# Patient Record
Sex: Male | Born: 1940 | Race: White | Hispanic: No | Marital: Married | Smoking: Former smoker
Health system: Southern US, Community
[De-identification: ages and names within clinical notes are randomized; demographics above are authoritative.]

## PROBLEM LIST (undated history)

## (undated) DIAGNOSIS — M1990 Unspecified osteoarthritis, unspecified site: Secondary | ICD-10-CM

## (undated) DIAGNOSIS — E11319 Type 2 diabetes mellitus with unspecified diabetic retinopathy without macular edema: Secondary | ICD-10-CM

## (undated) DIAGNOSIS — R270 Ataxia, unspecified: Secondary | ICD-10-CM

## (undated) DIAGNOSIS — K219 Gastro-esophageal reflux disease without esophagitis: Secondary | ICD-10-CM

## (undated) DIAGNOSIS — M549 Dorsalgia, unspecified: Secondary | ICD-10-CM

## (undated) DIAGNOSIS — R5383 Other fatigue: Secondary | ICD-10-CM

## (undated) DIAGNOSIS — K279 Peptic ulcer, site unspecified, unspecified as acute or chronic, without hemorrhage or perforation: Secondary | ICD-10-CM

## (undated) DIAGNOSIS — R251 Tremor, unspecified: Secondary | ICD-10-CM

## (undated) DIAGNOSIS — Z87442 Personal history of urinary calculi: Secondary | ICD-10-CM

## (undated) DIAGNOSIS — E119 Type 2 diabetes mellitus without complications: Secondary | ICD-10-CM

## (undated) DIAGNOSIS — G4700 Insomnia, unspecified: Secondary | ICD-10-CM

## (undated) DIAGNOSIS — I10 Essential (primary) hypertension: Secondary | ICD-10-CM

## (undated) DIAGNOSIS — E538 Deficiency of other specified B group vitamins: Secondary | ICD-10-CM

## (undated) DIAGNOSIS — F32A Depression, unspecified: Secondary | ICD-10-CM

## (undated) DIAGNOSIS — M869 Osteomyelitis, unspecified: Secondary | ICD-10-CM

## (undated) DIAGNOSIS — H269 Unspecified cataract: Secondary | ICD-10-CM

## (undated) DIAGNOSIS — G629 Polyneuropathy, unspecified: Secondary | ICD-10-CM

## (undated) DIAGNOSIS — E7800 Pure hypercholesterolemia, unspecified: Secondary | ICD-10-CM

## (undated) DIAGNOSIS — R42 Dizziness and giddiness: Secondary | ICD-10-CM

## (undated) DIAGNOSIS — M5416 Radiculopathy, lumbar region: Secondary | ICD-10-CM

## (undated) DIAGNOSIS — F419 Anxiety disorder, unspecified: Secondary | ICD-10-CM

## (undated) DIAGNOSIS — N200 Calculus of kidney: Secondary | ICD-10-CM

## (undated) DIAGNOSIS — H9190 Unspecified hearing loss, unspecified ear: Secondary | ICD-10-CM

## (undated) HISTORY — DX: Dizziness and giddiness: R42

## (undated) HISTORY — DX: Calculus of kidney: N20.0

## (undated) HISTORY — DX: Other fatigue: R53.83

## (undated) HISTORY — DX: Ataxia, unspecified: R27.0

## (undated) HISTORY — DX: Tremor, unspecified: R25.1

## (undated) HISTORY — DX: Insomnia, unspecified: G47.00

## (undated) HISTORY — DX: Osteomyelitis, unspecified: M86.9

## (undated) HISTORY — DX: Unspecified osteoarthritis, unspecified site: M19.90

## (undated) HISTORY — PX: JOINT REPLACEMENT: SHX530

## (undated) HISTORY — DX: Depression, unspecified: F32.A

## (undated) HISTORY — DX: Peptic ulcer, site unspecified, unspecified as acute or chronic, without hemorrhage or perforation: K27.9

## (undated) HISTORY — DX: Deficiency of other specified B group vitamins: E53.8

## (undated) HISTORY — DX: Anxiety disorder, unspecified: F41.9

## (undated) HISTORY — PX: EYE SURGERY: SHX253

## (undated) HISTORY — DX: Essential (primary) hypertension: I10

## (undated) HISTORY — DX: Radiculopathy, lumbar region: M54.16

## (undated) HISTORY — DX: Unspecified hearing loss, unspecified ear: H91.90

## (undated) HISTORY — DX: Polyneuropathy, unspecified: G62.9

## (undated) HISTORY — DX: Pure hypercholesterolemia, unspecified: E78.00

## (undated) HISTORY — PX: CATARACT EXTRACTION: SUR2

## (undated) HISTORY — DX: Type 2 diabetes mellitus with unspecified diabetic retinopathy without macular edema: E11.319

## (undated) HISTORY — DX: Type 2 diabetes mellitus without complications: E11.9

## (undated) HISTORY — DX: Unspecified cataract: H26.9

---

## 2016-06-11 ENCOUNTER — Encounter (INDEPENDENT_AMBULATORY_CARE_PROVIDER_SITE_OTHER): Payer: Self-pay

## 2016-06-11 ENCOUNTER — Encounter (INDEPENDENT_AMBULATORY_CARE_PROVIDER_SITE_OTHER): Payer: Self-pay | Admitting: Internal Medicine

## 2016-06-23 ENCOUNTER — Encounter (INDEPENDENT_AMBULATORY_CARE_PROVIDER_SITE_OTHER): Payer: Self-pay | Admitting: Internal Medicine

## 2016-06-23 ENCOUNTER — Ambulatory Visit (INDEPENDENT_AMBULATORY_CARE_PROVIDER_SITE_OTHER): Payer: Medicare HMO | Admitting: Internal Medicine

## 2016-06-23 VITALS — BP 150/68 | HR 60 | Temp 97.8°F | Ht 67.0 in | Wt 183.2 lb

## 2016-06-23 DIAGNOSIS — R195 Other fecal abnormalities: Secondary | ICD-10-CM

## 2016-06-23 DIAGNOSIS — I10 Essential (primary) hypertension: Secondary | ICD-10-CM

## 2016-06-23 DIAGNOSIS — E7800 Pure hypercholesterolemia, unspecified: Secondary | ICD-10-CM

## 2016-06-23 DIAGNOSIS — E119 Type 2 diabetes mellitus without complications: Secondary | ICD-10-CM

## 2016-06-23 HISTORY — DX: Pure hypercholesterolemia, unspecified: E78.00

## 2016-06-23 HISTORY — DX: Type 2 diabetes mellitus without complications: E11.9

## 2016-06-23 HISTORY — DX: Essential (primary) hypertension: I10

## 2016-06-23 NOTE — Progress Notes (Addendum)
   Subjective:    Patient ID: Thomas Adkins, male    DOB: 13-Sep-1940, 75 y.o.   MRN: [id number]  HPI Referred by Dr. Woody Seller for positive stool card/colonoscopy. Patient denies seeing any blood. No change in his stools. Usually has a BM once a day and sometimes two a day. Appetite is good. No weight loss. No abdominal pain. Takes Aleve occasionally. From records his last colonoscopy was in 2010 by Dr. Lindalou Hose and he reports it was normal.  No family hx of colon cancer. Diabetic x 4-5 yrs.   05/16/2016 H and H 14.3 and 42.3   Review of Systems     Past Medical History:  Diagnosis Date  . Diabetes ([place]) 06/23/2016  . High blood pressure 06/23/2016  . High cholesterol 06/23/2016    Past Surgical History:  Procedure Laterality Date  . CATARACT EXTRACTION     bilateral  . JOINT REPLACEMENT     toe 40 yrs ago    No Known Allergies  No current outpatient prescriptions on file prior to visit.   No current facility-administered medications on file prior to visit.     Objective:   Physical Exam Blood pressure (!) 150/68, pulse 60, temperature 97.8 F (36.6 C), height 5\' 7"  (1.702 m), weight 183 lb 3.2 oz (83.1 kg).  Alert and oriented. Skin warm and dry. Oral mucosa is moist.   . Sclera anicteric, conjunctivae is pink. Thyroid not enlarged. No cervical lymphadenopathy. Lungs clear. Heart regular rate and rhythm.  Abdomen is soft. Bowel sounds are positive. No hepatomegaly. No abdominal masses felt. No tenderness.  No edema to lower extremities.        Assessment & Plan:  Positive stool card.  Patient has declined a colonoscopy.Advised if he reconsiders will schedule the colonoscopy.

## 2016-06-23 NOTE — Patient Instructions (Signed)
Patient declined colonoscopy today.

## 2016-06-25 ENCOUNTER — Encounter (INDEPENDENT_AMBULATORY_CARE_PROVIDER_SITE_OTHER): Payer: Self-pay

## 2017-06-05 ENCOUNTER — Emergency Department (HOSPITAL_COMMUNITY): Payer: Medicare HMO

## 2017-06-05 ENCOUNTER — Encounter (HOSPITAL_COMMUNITY): Payer: Self-pay

## 2017-06-05 ENCOUNTER — Emergency Department (HOSPITAL_COMMUNITY)
Admission: EM | Admit: 2017-06-05 | Discharge: 2017-06-05 | Disposition: A | Discharge status: Home / Self Care | Payer: Medicare HMO | Attending: Emergency Medicine | Admitting: Emergency Medicine

## 2017-06-05 DIAGNOSIS — Z7984 Long term (current) use of oral hypoglycemic drugs: Secondary | ICD-10-CM | POA: Diagnosis not present

## 2017-06-05 DIAGNOSIS — M5030 Other cervical disc degeneration, unspecified cervical region: Secondary | ICD-10-CM | POA: Insufficient documentation

## 2017-06-05 DIAGNOSIS — Z7982 Long term (current) use of aspirin: Secondary | ICD-10-CM | POA: Insufficient documentation

## 2017-06-05 DIAGNOSIS — Z79899 Other long term (current) drug therapy: Secondary | ICD-10-CM | POA: Insufficient documentation

## 2017-06-05 DIAGNOSIS — I10 Essential (primary) hypertension: Secondary | ICD-10-CM | POA: Insufficient documentation

## 2017-06-05 DIAGNOSIS — W19XXXA Unspecified fall, initial encounter: Secondary | ICD-10-CM | POA: Diagnosis not present

## 2017-06-05 DIAGNOSIS — M7918 Myalgia, other site: Secondary | ICD-10-CM

## 2017-06-05 DIAGNOSIS — E119 Type 2 diabetes mellitus without complications: Secondary | ICD-10-CM | POA: Insufficient documentation

## 2017-06-05 DIAGNOSIS — M542 Cervicalgia: Secondary | ICD-10-CM | POA: Diagnosis present

## 2017-06-05 MED ORDER — HYDROCODONE-ACETAMINOPHEN 5-325 MG PO TABS
1.0000 | ORAL_TABLET | Freq: Once | ORAL | Status: AC
  Administered 2017-06-05: 1 via ORAL
  Filled 2017-06-05: qty 1

## 2017-06-05 MED ORDER — HYDROCODONE-ACETAMINOPHEN 5-325 MG PO TABS: ORAL_TABLET | ORAL | 0 refills | Status: DC

## 2017-06-05 MED ORDER — METHOCARBAMOL 500 MG PO TABS
500.0000 mg | ORAL_TABLET | Freq: Once | ORAL | Status: AC
  Administered 2017-06-05: 500 mg via ORAL
  Filled 2017-06-05: qty 1

## 2017-06-05 MED ORDER — METHOCARBAMOL 500 MG PO TABS: 500.0000 mg | ORAL_TABLET | Freq: Two times a day (BID) | ORAL | 0 refills | Status: DC | PRN

## 2017-06-05 NOTE — Discharge Instructions (Signed)
Take the prescriptions as directed.  Apply moist heat or ice to the area(s) of discomfort, for 15 minutes at a time, several times per day for the next few days.  Do not fall asleep on a heating or ice pack.  Call your regular medical doctor on Monday to schedule a follow up appointment in the next 3 days.  Return to the Emergency Department immediately if worsening.

## 2017-06-05 NOTE — ED Provider Notes (Signed)
[facility] EMERGENCY DEPARTMENT Provider Note   CSN: [id number] Arrival date & time: 06/05/17  2353     History   Chief Complaint Chief Complaint  Patient presents with  . Neck Injury    HPI Thomas Adkins is a 76 y.o. male.  HPI Pt was seen at 0710. Per pt, c/o gradual onset and persistence of constant neck "pain" for the past 2 days.  Pain worsens with palpation of the area and body position changes. Pt states he fell backwards onto his buttocks 5 to 6 days ago. Denies hitting head, no LOC, no AMS, no LBP.  Denies incont/retention of bowel or bladder, no saddle anesthesia, no focal motor weakness, no tingling/numbness in extremities, no fevers, no direct injury, no abd pain, no CP/SOB.      Past Medical History:  Diagnosis Date  . Diabetes ([place]) 06/23/2016  . High blood pressure 06/23/2016  . High cholesterol 06/23/2016    Patient Active Problem List   Diagnosis Date Noted  . High blood pressure 06/23/2016  . High cholesterol 06/23/2016  . Diabetes ([place]) 06/23/2016    Past Surgical History:  Procedure Laterality Date  . CATARACT EXTRACTION     bilateral  . JOINT REPLACEMENT     toe 40 yrs ago       Home Medications    Prior to Admission medications   Medication Sig Start Date End Date Taking? Authorizing Provider  aspirin 81 MG chewable tablet Chew by mouth daily.    [provider]  gabapentin (NEURONTIN) 300 MG capsule Take 300 mg by mouth at bedtime.    [provider]  glipiZIDE (GLUCOTROL) 10 MG tablet Take 10 mg by mouth 2 (two) times daily before a meal.    [provider]  losartan (COZAAR) 100 MG tablet Take 100 mg by mouth daily.    [provider]  metFORMIN (GLUCOPHAGE) 500 MG tablet Take by mouth 2 (two) times daily with a meal. Two in am and one in pm    [provider]  metoprolol (LOPRESSOR) 50 MG tablet Take 50 mg by mouth 2 (two) times daily. 1 in am and 1/2 in pm    [provider]    pioglitazone (ACTOS) 15 MG tablet Take 15 mg by mouth daily.    [provider]  simvastatin (ZOCOR) 20 MG tablet Take 20 mg by mouth daily.    [provider]    Family History History reviewed. No pertinent family history.  Social History Social History  Substance Use Topics  . Smoking status: Never Smoker  . Smokeless tobacco: Never Used  . Alcohol use No     Allergies   Patient has no known allergies.   Review of Systems Review of Systems ROS: Statement: All systems negative except as marked or noted in the HPI; Constitutional: Negative for fever and chills. ; ; Eyes: Negative for eye pain, redness and discharge. ; ; ENMT: Negative for ear pain, hoarseness, nasal congestion, sinus pressure and sore throat. ; ; Cardiovascular: Negative for chest pain, palpitations, diaphoresis, dyspnea and peripheral edema. ; ; Respiratory: Negative for cough, wheezing and stridor. ; ; Gastrointestinal: Negative for nausea, vomiting, diarrhea, abdominal pain, blood in stool, hematemesis, jaundice and rectal bleeding. . ; ; Genitourinary: Negative for dysuria, flank pain and hematuria. ; ; Musculoskeletal: Negative for back pain. +neck pain. Negative for swelling and direct trauma.; ; Skin: Negative for pruritus, rash, abrasions, blisters, bruising and skin lesion.; ; Neuro: Negative for headache,  lightheadedness and neck stiffness. Negative for weakness, altered level of consciousness, altered mental status, extremity weakness, paresthesias, involuntary movement, seizure and syncope.       Physical Exam Updated Vital Signs BP (!) 163/76 (BP Location: Right Arm)   Pulse 92   Temp 99.2 F (37.3 C) (Oral)   Resp 18   Wt 78.9 kg (174 lb)   SpO2 97%   BMI 27.25 kg/m   Physical Exam 0715: Physical examination:  Nursing notes reviewed; Vital signs and O2 SAT reviewed;  Constitutional: Well developed, Well nourished, Well hydrated, In no acute distress; Head:  Normocephalic,  atraumatic; Eyes: EOMI, PERRL, No scleral icterus; ENMT: Mouth and pharynx normal, Mucous membranes moist; Neck: Supple, Full range of motion, No lymphadenopathy; Cardiovascular: Regular rate and rhythm, No gallop; Respiratory: Breath sounds clear & equal bilaterally, No wheezes.  Speaking full sentences with ease, Normal respiratory effort/excursion; Chest: Nontender, Movement normal; Abdomen: Soft, Nontender, Nondistended, Normal bowel sounds; Genitourinary: No CVA tenderness; Spine:  No midline CS, TS, LS tenderness. +TTP bilat hypertonic trapezius muscles. No rash.;; Extremities: Pulses normal, No tenderness, No edema, No calf edema or asymmetry.; Neuro: AA&Ox3, Major CN grossly intact.  Speech clear. No gross focal motor or sensory deficits in extremities.; Skin: Color normal, Warm, Dry.   ED Treatments / Results  Labs (all labs ordered are listed, but only abnormal results are displayed)   EKG  EKG Interpretation None       Radiology   Procedures Procedures (including critical care time)  Medications Ordered in ED Medications  HYDROcodone-acetaminophen (NORCO/VICODIN) 5-325 MG per tablet 1 tablet (not administered)  methocarbamol (ROBAXIN) tablet 500 mg (not administered)     Initial Impression / Assessment and Plan / ED Course  I have reviewed the triage vital signs and the nursing notes.  Pertinent labs & imaging results that were available during my care of the patient were reviewed by me and considered in my medical decision making (see chart for details).  MDM Reviewed: previous chart, nursing note and vitals Interpretation: CT scan   Ct Cervical Spine Wo Contrast Result Date: 06/05/2017 CLINICAL DATA:  Neck pain. EXAM: CT CERVICAL SPINE WITHOUT CONTRAST TECHNIQUE: Multidetector CT imaging of the cervical spine was performed without intravenous contrast. Multiplanar CT image reconstructions were also generated. COMPARISON:  None. FINDINGS: Alignment: Normal. Skull  base and vertebrae: No acute fracture. No primary bone lesion or focal pathologic process. Soft tissues and spinal canal: No prevertebral fluid or swelling. No visible canal hematoma. Disc levels: Partial osseous fusion of the C3-C4 vertebral bodies. Solid osseous fusion of the left C3-C4 facets. Moderate disc height loss and left facet arthropathy at C4-C5 and C5-C6. Scattered uncovertebral hypertrophy. Mild neuroforaminal stenosis on the left at C3-C4 and C4-C5, and bilaterally at C5-C6. Upper chest: Atherosclerotic vascular calcifications. Otherwise negative. Other: None. IMPRESSION: 1.  No acute osseous abnormality. 2. Multilevel degenerative changes of the cervical spine as described above. Electronically Signed   By: Titus Dubin M.D.   On: 06/05/2017 08:06    0815:  Improved after meds and wants to go home now. Currently sitting up on stretcher eating fast food breakfast, NAD, resps easy. CT reassuring. Tx symptomatically at this time. Dx and testing d/w pt and family.  Questions answered.  Verb understanding, agreeable to d/c home with outpt f/u.    Final Clinical Impressions(s) / ED Diagnoses   Final diagnoses:  None    New Prescriptions New Prescriptions   No medications on file  Francine Graven, DO 06/09/17 1336

## 2017-06-05 NOTE — ED Triage Notes (Signed)
Pt fell Sunday and about two days later started having some neck pain/shoulder.

## 2017-06-05 NOTE — ED Notes (Signed)
ED Provider at bedside. 

## 2017-08-18 ENCOUNTER — Ambulatory Visit: Payer: Medicare HMO | Admitting: Urology

## 2017-08-18 DIAGNOSIS — N200 Calculus of kidney: Secondary | ICD-10-CM | POA: Diagnosis not present

## 2017-09-01 ENCOUNTER — Other Ambulatory Visit (HOSPITAL_COMMUNITY): Payer: Self-pay | Admitting: Interventional Radiology

## 2017-09-01 DIAGNOSIS — M4646 Discitis, unspecified, lumbar region: Secondary | ICD-10-CM

## 2017-09-01 DIAGNOSIS — M869 Osteomyelitis, unspecified: Secondary | ICD-10-CM

## 2017-09-01 NOTE — Progress Notes (Signed)
Subjective:    Patient ID: Thomas Adkins, male    DOB: 04/13/1941, 77 y.o.   MRN: [id number]  Chief Complaint  Patient presents with  . Osteomyelitis   . Discitis     HPI:  Thomas Adkins is a 77 y.o. 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  Thomas Adkins sustained a fall at home approximately 2 months ago and has continued to have pain located in his lumbar spine and right leg. The pain has been refractory to conservative treatment including toradol and prednisone injection, tramadol, gabapentin, oral prednisone and anti-inflammatories. MRI of the lumbar spine showed findings consistent with discitis and osteomyelitis at L3-4 with destruction of the interior endplate of L3 and of the superior endplate of L4. There was no epidural abscess or paraspinal abscess. There was also broad based disc protrusion at L3-4 creating severe right foraminal stenosis and left far lateral neural impingement. The case was discussed with Dr. Carloyn Adkins who recommended no antibiotics until the area can be cultured.   Continues to have pain that radiates down his right leg. Has had some weight loss with decreased appetite. Denies fevers, chills, or night sweats. Modifying factors include Tramadol which helps to decrease the pain. Pain is increased with movement and has trouble walking for long periods of time. Describes walking to the bathroom is as far as he can walk without stopping. Course of the symptoms have stayed about the same since initial onset. Pain is described as sharp at times. Does have some radiculopathy.    No Known Allergies    Outpatient Medications Prior to Visit  Medication Sig Dispense Refill  . aspirin 81 MG chewable tablet Chew by mouth daily.    Marland Kitchen gabapentin (NEURONTIN) 300 MG capsule Take 300 mg by mouth at bedtime.    Marland Kitchen glipiZIDE (GLUCOTROL) 10 MG tablet Take 10 mg by mouth 2 (two) times daily before a  meal.    . metFORMIN (GLUCOPHAGE) 500 MG tablet Take by mouth 2 (two) times daily with a meal. Two in am and one in pm    . metoprolol (LOPRESSOR) 50 MG tablet Take 50 mg by mouth 2 (two) times daily. 1 in am and 1/2 in pm    . pioglitazone (ACTOS) 15 MG tablet Take 15 mg by mouth daily.    . simvastatin (ZOCOR) 20 MG tablet Take 20 mg by mouth daily.    Marland Kitchen HYDROcodone-acetaminophen (NORCO/VICODIN) 5-325 MG tablet 1 tab Adkins q12 hours prn pain (Patient not taking: Reported on 09/02/2017) 10 tablet 0  . losartan (COZAAR) 100 MG tablet Take 100 mg by mouth daily.    . methocarbamol (ROBAXIN) 500 MG tablet Take 1 tablet (500 mg total) by mouth 2 (two) times daily as needed for muscle spasms. 10 tablet 0   No facility-administered medications prior to visit.      Past Medical History:  Diagnosis Date  . Diabetes ([place]) 06/23/2016  . High blood pressure 06/23/2016  . High cholesterol 06/23/2016      Past Surgical History:  Procedure Laterality Date  . CATARACT EXTRACTION     bilateral  . JOINT REPLACEMENT     toe 40 yrs ago      History reviewed. No pertinent family history.    Social History   Socioeconomic History  . Marital status: Married    Spouse name: Not on file  . Number of children: Not on file  . Years of education:  Not on file  . Highest education level: Not on file  Social Needs  . Financial resource strain: Not on file  . Food insecurity - worry: Not on file  . Food insecurity - inability: Not on file  . Transportation needs - medical: Not on file  . Transportation needs - non-medical: Not on file  Occupational History  . Not on file  Tobacco Use  . Smoking status: Never Smoker  . Smokeless tobacco: Never Used  Substance and Sexual Activity  . Alcohol use: No  . Drug use: No  . Sexual activity: Not on file  Other Topics Concern  . Not on file  Social History Narrative  . Not on file      Review of Systems  Constitutional: Negative for chills,  diaphoresis, fatigue and fever.  Respiratory: Negative for cough, chest tightness and shortness of breath.   Cardiovascular: Negative for chest pain, palpitations and leg swelling.  Gastrointestinal: Negative for abdominal pain.  Musculoskeletal: Positive for back pain.  Neurological: Positive for numbness. Negative for tremors and weakness.       Objective:    BP (!) 146/89   Pulse (!) 121   Temp 98.3 F (36.8 C) (Oral)   Ht _0  (1.676 m)   Wt 166 lb (75.3 kg)   BMI 26.79 kg/m  Nursing note and vital signs reviewed.  Physical Exam  Constitutional: He is oriented to person, place, and time. He appears well-developed and well-nourished. No distress.  Pleasant; seated in the chair in good spirits.   Cardiovascular: Normal rate, regular rhythm, normal heart sounds and intact distal pulses.  Pulmonary/Chest: Effort normal and breath sounds normal. No respiratory distress. He has no wheezes. He has no rales. He exhibits no tenderness.  Musculoskeletal:  Lumbar spine - no obvious deformity, discoloration, or edema noted. There is tenderness of the lumbar spine from L2-L4 primarily on the right side along the spinous and transverse process. There is also tenderness of the paraspinal musculature. ROM decreased secondary to pain. Distal pulses and sensation are intact and appropriate.  Neurological: He is alert and oriented to person, place, and time.  Skin: Skin is warm and dry.  Psychiatric: He has a normal mood and affect. His behavior is normal. Judgment and thought content normal.        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 We will check his blood cultures, CMP, CRP and ESR today. An order has been placed for interventional radiology for aspiration and culture to direct  antibiotic care. No systemic signs of infection. Continue off antibiotics until cultures. Will plan for PICC insertion and 6 weeks of antibiotic therapy.       Relevant Orders   Culture, blood (single)   Culture, blood (single)   C-reactive protein   Sedimentation rate   Comprehensive metabolic panel       I am having Thomas Adkins maintain his aspirin, metoprolol tartrate, pioglitazone, glipiZIDE, gabapentin, losartan, metFORMIN, simvastatin, HYDROcodone-acetaminophen, and methocarbamol.   Follow-up: Pending IR aspiration   Thomas Adkins, [facility]

## 2017-09-02 ENCOUNTER — Ambulatory Visit: Payer: Medicare HMO | Admitting: Family

## 2017-09-02 ENCOUNTER — Encounter: Payer: Self-pay | Admitting: Family

## 2017-09-02 ENCOUNTER — Telehealth (HOSPITAL_COMMUNITY): Payer: Self-pay

## 2017-09-02 VITALS — BP 146/89 | HR 121 | Temp 98.3°F | Ht 66.0 in | Wt 166.0 lb

## 2017-09-02 DIAGNOSIS — M4620 Osteomyelitis of vertebra, site unspecified: Secondary | ICD-10-CM

## 2017-09-02 NOTE — Assessment & Plan Note (Signed)
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 We will check his blood cultures, CMP, CRP and ESR today. An order has been placed for interventional radiology for aspiration and culture to direct antibiotic care. No systemic signs of infection. Continue off antibiotics until cultures. Will plan for PICC insertion and 6 weeks of antibiotic therapy.

## 2017-09-02 NOTE — Telephone Encounter (Signed)
Called to schedule lumbar aspiration, left message for pt to return call. AW

## 2017-09-02 NOTE — Patient Instructions (Signed)
Nice to meet you!  We will check you blood work today and get you scheduled for a culture of your back.  Continue to take your medications as prescribed.   Diskitis Diskitis is irritation and swelling (inflammation) of the disks in the spine. Disks are soft structures that cushion the bones of the spine. This is not a common condition. Diskitis most often affects the disks of the lower back (lumbar disks) or upper back (thoracic disks). What are the causes? A bacterial or viral infection can lead to diskitis. When diskitis develops, it is often accompanied by infection-induced inflammation of the bones (osteomyelitis) surrounding the spine. The condition can also be caused by inflammation from another condition, like an autoimmune disease. If you have an autoimmune disease, your body's defense system (immune system) mistakenly attacks your own healthy cells instead of germs and other things that can make you sick. What increases the risk? People at higher risk of developing diskitis include:  Children.  Older persons.  People with weak immune systems or immune system disorders.  People with diabetes.  People having chemotherapy.  What are the signs or symptoms? Back or stomach pain is the most common symptom of diskitis. Walking, standing, and sitting may be painful. Other symptoms may include:  Trouble standing or rising from a sitting position.  Fever lower than 102F (38.9C).  Back stiffness.  Flu-like symptoms, such as a sore throat and a runny nose.  Irritability.  Feeling pain when the affected area is touched.  How is this diagnosed? Your health care provider can diagnose diskitis based on symptoms and medical history. Your health care provider will also do a physical exam. You may also need to have blood tests or imaging studies, such as:  X-ray of the spine.  MRI of the spine.  A bone scan.  How is this treated? Treatment for diskitis may include:  Bed  rest.  Medicines, such as: ? Antibiotics to treat a possible bacterial infection. ? Anti-inflammatory medicines. ? Steroids if the condition does not improve over time. ? Pain-relieving medicines.  A brace to stop your back from moving.  Follow these instructions at home:  If you were prescribed an antibiotic medicine, finish it all even if you start to feel better.  Take medicines only as directed by your health care provider.  Keep all follow-up visits as directed by your health care provider. This is important. Contact a health care provider if:  You have difficulty walking or standing.  You have persistent back pain.  You are having side effects from medicines. This information is not intended to replace advice given to you by your health care provider. Make sure you discuss any questions you have with your health care provider. Document Released: 06/27/2004 Document Revised: 01/02/2016 Document Reviewed: 11/29/2013 Elsevier Interactive Patient Education  2018 [place] Insertion A peripherally inserted central catheter (PICC) is a long, thin, flexible tube that is inserted into a vein in the upper arm. It is a form of IV access. It is considered to be a "central" line because the tip of the PICC ends in a large vein in your chest. This large vein is called the superior vena cava (SVC). The PICC tip ends in the SVC because there is a lot of blood flow in the SVC. This allows medicines and IV fluids to be quickly distributed throughout the body. The PICC is inserted using a sterile technique by a specially trained health care provider. After the  PICC is inserted, a chest X-ray may be done to ensure that it is in the correct place. Tell a health care provider about:  Any allergies you have.  All medicines you are taking, including vitamins, herbs, eye drops, creams, and over-the-counter medicines.  Any problems you or family members have had with anesthetic  medicines.  Any blood disorders you have.  Any surgeries you have had.  Any medical conditions you have. What are the risks? Generally, this is a safe procedure. However, as with any procedure, complications can occur. Possible complications include:  Infection at the insertion site or in the blood.  Bleeding at the insertion site or internally.  Injury or collapse of the lung.  Movement or malposition of the PICC.  Inflammation of the vein (phlebitis).  Nerve injury or irritation.  Clot formation at the tip of the PICC line.  Blood clot in the lung (pulmonary embolus).  Injury to the large blood vessels or heart (rare).  What happens before the procedure?  Your health care provider may want you to have blood tests. These tests can help tell how well your blood clots.  If you take blood thinners (anticoagulant medicine), ask your health care provider if you should stop taking them. What happens during the procedure?  You will have to lie flat for about 30-45 minutes for this procedure.  Your health care provider will start by identifying a vein into which the PICC line will be placed. This is done using ultrasound or X-ray guidance.  Medicine is used to numb the skin around the insertion site.  The skin where the catheter will be inserted is cleaned and covered with a sterile surgical drape.  A small needle is inserted into the vein, and then a small guidewire is advanced into the superior vena cava.  The catheter is then advanced over the guidewire and moved into position. The guidewire is then removed.  The catheter is flushed and blood is drawn back to confirm it is in the vein.  If this is done without X-ray guidance, an X-ray will be needed to make sure the catheter tip is in the correct position.  Once the placement is confirmed, the PICC is secured to the skin with a device and covered with a sterile dressing. What happens after the procedure?  You should  avoid any strenuous activity for the next 2 days or as directed by your health care provider.  You will be allowed to go back to your regular activities after the procedure.  You will be instructed on the care of your PICC line. This information is not intended to replace advice given to you by your health care provider. Make sure you discuss any questions you have with your health care provider. Document Released: 05/17/2013 Document Revised: 01/02/2016 Document Reviewed: 05/19/2013 Elsevier Interactive Patient Education  2017 [employer].

## 2017-09-03 ENCOUNTER — Encounter: Payer: Self-pay | Admitting: Infectious Disease

## 2017-09-03 LAB — COMPREHENSIVE METABOLIC PANEL
AG Ratio: 1.3 (calc) (ref 1.0–2.5)
ALBUMIN MSPROF: 3.7 g/dL (ref 3.6–5.1)
ALT: 17 U/L (ref 9–46)
AST: 14 U/L (ref 10–35)
Alkaline phosphatase (APISO): 141 U/L — ABNORMAL HIGH (ref 40–115)
BILIRUBIN TOTAL: 0.7 mg/dL (ref 0.2–1.2)
BUN/Creatinine Ratio: 26 (calc) — ABNORMAL HIGH (ref 6–22)
BUN: 34 mg/dL — AB (ref 7–25)
CO2: 23 mmol/L (ref 20–32)
CREATININE: 1.3 mg/dL — AB (ref 0.70–1.18)
Calcium: 9.8 mg/dL (ref 8.6–10.3)
Chloride: 96 mmol/L — ABNORMAL LOW (ref 98–110)
Globulin: 2.8 g/dL (calc) (ref 1.9–3.7)
Glucose, Bld: 613 mg/dL (ref 65–99)
POTASSIUM: 5.2 mmol/L (ref 3.5–5.3)
SODIUM: 130 mmol/L — AB (ref 135–146)
TOTAL PROTEIN: 6.5 g/dL (ref 6.1–8.1)

## 2017-09-03 LAB — C-REACTIVE PROTEIN: CRP: 3.9 mg/L (ref <8.0)

## 2017-09-03 LAB — SEDIMENTATION RATE: SED RATE: 11 mm/h (ref 0–20)

## 2017-09-03 NOTE — Progress Notes (Signed)
I received a call at 1:30 in the morning with the guards this patient's blood sugar that was well over 600. I called the patient at home and informed him of his blood sugar which she said was the highest it ever been. He agreed to go see his primary care physician versus an urgent care physician in the morning again Lab was drawn well over 13 hours prior to the phone call made to me

## 2017-09-08 ENCOUNTER — Telehealth: Payer: Self-pay | Admitting: *Deleted

## 2017-09-08 ENCOUNTER — Other Ambulatory Visit: Payer: Self-pay | Admitting: Radiology

## 2017-09-08 LAB — CULTURE, BLOOD (SINGLE)
MICRO NUMBER: 90102218
MICRO NUMBER:: 90102220

## 2017-09-08 NOTE — Telephone Encounter (Signed)
Relayed message to patient, confirmed that he needs to keep his upcoming appointment at IR tomorrow morning, 6:30 arrival.  Landis Gandy, RN

## 2017-09-08 NOTE — Telephone Encounter (Signed)
-----   Message from [place], [place] sent at 09/08/2017 11:41 AM EST ----- Please inform patient that his blood cultures were were negative indicating no bacterial infection in his blood and his markers of inflammation also looked okay. Please work to continue controlling blood sugars with his PCP. Follow up and additional treatment pending the results of his appointment with IR on 1/31.

## 2017-09-09 ENCOUNTER — Ambulatory Visit (HOSPITAL_COMMUNITY)
Admission: RE | Admit: 2017-09-09 | Discharge: 2017-09-09 | Disposition: A | Discharge status: Home / Self Care | Payer: Medicare HMO | Source: Ambulatory Visit | Attending: Interventional Radiology | Admitting: Interventional Radiology

## 2017-09-09 ENCOUNTER — Encounter (HOSPITAL_COMMUNITY): Payer: Self-pay

## 2017-09-09 DIAGNOSIS — Z7982 Long term (current) use of aspirin: Secondary | ICD-10-CM | POA: Insufficient documentation

## 2017-09-09 DIAGNOSIS — E7800 Pure hypercholesterolemia, unspecified: Secondary | ICD-10-CM | POA: Insufficient documentation

## 2017-09-09 DIAGNOSIS — I10 Essential (primary) hypertension: Secondary | ICD-10-CM | POA: Diagnosis not present

## 2017-09-09 DIAGNOSIS — M4646 Discitis, unspecified, lumbar region: Secondary | ICD-10-CM

## 2017-09-09 DIAGNOSIS — Z7984 Long term (current) use of oral hypoglycemic drugs: Secondary | ICD-10-CM | POA: Insufficient documentation

## 2017-09-09 DIAGNOSIS — M869 Osteomyelitis, unspecified: Secondary | ICD-10-CM

## 2017-09-09 DIAGNOSIS — E119 Type 2 diabetes mellitus without complications: Secondary | ICD-10-CM | POA: Insufficient documentation

## 2017-09-09 HISTORY — PX: IR LUMBAR DISC ASPIRATION W/IMG GUIDE: IMG5306

## 2017-09-09 LAB — BASIC METABOLIC PANEL
Anion gap: 13 (ref 5–15)
BUN: 25 mg/dL — ABNORMAL HIGH (ref 6–20)
CALCIUM: 9.7 mg/dL (ref 8.9–10.3)
CO2: 19 mmol/L — ABNORMAL LOW (ref 22–32)
CREATININE: 1.04 mg/dL (ref 0.61–1.24)
Chloride: 104 mmol/L (ref 101–111)
GFR calc Af Amer: >60 mL/min (ref >60)
GFR calc non Af Amer: >60 mL/min (ref >60)
GLUCOSE: 93 mg/dL (ref 65–99)
Potassium: 5.1 mmol/L (ref 3.5–5.1)
Sodium: 136 mmol/L (ref 135–145)

## 2017-09-09 LAB — CBC
HCT: 39.8 % (ref 39.0–52.0)
Hemoglobin: 13.8 g/dL (ref 13.0–17.0)
MCH: 31.9 pg (ref 26.0–34.0)
MCHC: 34.7 g/dL (ref 30.0–36.0)
MCV: 91.9 fL (ref 78.0–100.0)
PLATELETS: 179 10*3/uL (ref 150–400)
RBC: 4.33 MIL/uL (ref 4.22–5.81)
RDW: 14 % (ref 11.5–15.5)
WBC: 10 10*3/uL (ref 4.0–10.5)

## 2017-09-09 LAB — PROTIME-INR
INR: 0.96
PROTHROMBIN TIME: 12.6 s (ref 11.4–15.2)

## 2017-09-09 LAB — APTT: aPTT: 28 seconds (ref 24–36)

## 2017-09-09 LAB — GLUCOSE, CAPILLARY: Glucose-Capillary: 86 mg/dL (ref 65–99)

## 2017-09-09 MED ORDER — SODIUM CHLORIDE 0.9 % IV SOLN: INTRAVENOUS | Status: DC

## 2017-09-09 MED ORDER — FENTANYL CITRATE (PF) 100 MCG/2ML IJ SOLN
INTRAMUSCULAR | Status: AC | PRN
  Administered 2017-09-09 (×3): 25 ug via INTRAVENOUS

## 2017-09-09 MED ORDER — SODIUM CHLORIDE 0.9 % IV SOLN
INTRAVENOUS | Status: DC
  Administered 2017-09-09: 11:00:00 via INTRAVENOUS

## 2017-09-09 MED ORDER — FENTANYL CITRATE (PF) 100 MCG/2ML IJ SOLN
INTRAMUSCULAR | Status: AC
  Filled 2017-09-09: qty 4

## 2017-09-09 MED ORDER — MIDAZOLAM HCL 2 MG/2ML IJ SOLN
INTRAMUSCULAR | Status: AC
  Filled 2017-09-09: qty 4

## 2017-09-09 MED ORDER — HYDROMORPHONE HCL 1 MG/ML IJ SOLN
INTRAMUSCULAR | Status: AC
  Filled 2017-09-09: qty 1

## 2017-09-09 MED ORDER — MIDAZOLAM HCL 2 MG/2ML IJ SOLN
INTRAMUSCULAR | Status: AC | PRN
  Administered 2017-09-09 (×2): 0.5 mg via INTRAVENOUS
  Administered 2017-09-09: 1 mg via INTRAVENOUS

## 2017-09-09 MED ORDER — SODIUM CHLORIDE 0.9 % IV SOLN
INTRAVENOUS | Status: AC | PRN
  Administered 2017-09-09: 10 mL/h via INTRAVENOUS

## 2017-09-09 MED ORDER — BUPIVACAINE HCL (PF) 0.5 % IJ SOLN
INTRAMUSCULAR | Status: AC
  Filled 2017-09-09: qty 30

## 2017-09-09 NOTE — Progress Notes (Signed)
09/09/17:  Orders for labs wanted were not in the patient orders.  I tried to contact Dr. Tommy Medal to confirm labs.  I was unable to contact him so I paged Dr. Johnnye Sima.  He requested that a routine culture, gram stain, AFB and Fungal culture be done on the specimen obtained during a L3-4 disc aspiration.

## 2017-09-09 NOTE — Discharge Instructions (Addendum)
°Needle Biopsy of the Bone, Care After °Refer to this sheet in the next few weeks. These instructions provide you with information about caring for yourself after your procedure. Your health care provider may also give you more specific instructions. Your treatment has been planned according to current medical practices, but problems sometimes occur. Call your health care provider if you have any problems or questions after your procedure. °What can I expect after the procedure? °After your procedure, it is common to have soreness or tenderness at the puncture site. °Follow these instructions at home: °· Take over-the-counter and prescription medicines only as told by your health care provider. °· Bathe and shower as told by your health care provider. °· Follow instructions from your health care provider about: °? How to take care of your puncture site. °? When and how you should change your bandage (dressing). °? When you should remove your dressing. °· Check your puncture site every day for signs of infection. Watch for: °? Redness, swelling, or worsening pain. °? Fluid, blood, or pus. °· Return to your normal activities as told by your health care provider. °· Keep all follow-up visits as told by your health care provider. This is important. °Contact a health care provider if: °· You have redness, swelling, or worsening pain at the site of your puncture. °· You have fluid, blood, or pus coming from your puncture site. °· You have a fever. °· You have persistent nausea or vomiting. °Get help right away if: °· You develop a rash. °· You have difficulty breathing. °This information is not intended to replace advice given to you by your health care provider. Make sure you discuss any questions you have with your health care provider. °Document Released: 02/13/2005 Document Revised: 01/02/2016 Document Reviewed: 09/03/2014 °Elsevier Interactive Patient Education © 2018 Elsevier Inc[employer] °Moderate Conscious Sedation,  Adult, Care After °These instructions provide you with information about caring for yourself after your procedure. Your health care provider may also give you more specific instructions. Your treatment has been planned according to current medical practices, but problems sometimes occur. Call your health care provider if you have any problems or questions after your procedure. °What can I expect after the procedure? °After your procedure, it is common: °· To feel sleepy for several hours. °· To feel clumsy and have poor balance for several hours. °· To have poor judgment for several hours. °· To vomit if you eat too soon. ° °Follow these instructions at home: °For at least 24 hours after the procedure: ° °· Do not: °? Participate in activities where you could fall or become injured. °? Drive. °? Use heavy machinery. °? Drink alcohol. °? Take sleeping pills or medicines that cause drowsiness. °? Make important decisions or sign legal documents. °? Take care of children on your own. °· Rest. °Eating and drinking °· Follow the diet recommended by your health care provider. °· If you vomit: °? Drink water, juice, or soup when you can drink without vomiting. °? Make sure you have little or no nausea before eating solid foods. °General instructions °· Have a responsible adult stay with you until you are awake and alert. °· Take over-the-counter and prescription medicines only as told by your health care provider. °· If you smoke, do not smoke without supervision. °· Keep all follow-up visits as told by your health care provider. This is important. °Contact a health care provider if: °· You keep feeling nauseous or you keep vomiting. °· You feel light-headed. °·   You develop a rash. °· You have a fever. °Get help right away if: °· You have trouble breathing. °This information is not intended to replace advice given to you by your health care provider. Make sure you discuss any questions you have with your health care  provider. °Document Released: 05/17/2013 Document Revised: 12/30/2015 Document Reviewed: 11/16/2015 °Elsevier Interactive Patient Education © 2018 Elsevier Inc. ° °

## 2017-09-09 NOTE — Procedures (Signed)
S/P L3-L4 disc aspiration under fluoroscopic guidance . Approx 10 cc of thick bloody aspirate obtained.

## 2017-09-09 NOTE — H&P (Signed)
Chief Complaint: Patient was seen in consultation today for lumbar disc aspiration  Referring Physician(s): Dr. Tommy Medal  Supervising Physician: Luanne Bras  Patient Status: [facility] - Out-pt  History of Present Illness: Thomas Adkins is a 77 y.o. male with past medical history of HTN and DM who sustained a fall 2-3 months ago.  Since fall he has had worsening back pain.  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 He has been evaluated by Dr. Tommy Medal who has requested aspiration and culture.   Patient presents to radiology department in his usual state of health for procedure today.  He has no complaints.  He has been NPO.  He does not take blood thinners.   Past Medical History:  Diagnosis Date  . Diabetes ([place]) 06/23/2016  . High blood pressure 06/23/2016  . High cholesterol 06/23/2016    Past Surgical History:  Procedure Laterality Date  . CATARACT EXTRACTION     bilateral  . JOINT REPLACEMENT     toe 40 yrs ago    Allergies: Patient has no known allergies.  Medications: Prior to Admission medications   Medication Sig Start Date End Date Taking? Authorizing Provider  aspirin 81 MG tablet Take 81 mg by mouth daily.    Yes [provider]  diclofenac sodium (VOLTAREN) 1 % GEL Apply 1 application topically 3 (three) times daily as needed (pain).   Yes [provider]  gabapentin (NEURONTIN) 300 MG capsule Take 300 mg by mouth at bedtime.   Yes [provider]  glipiZIDE (GLUCOTROL) 10 MG tablet Take 10 mg by mouth 2 (two) times daily before a meal.   Yes [provider]  metFORMIN (GLUCOPHAGE-XR) 500 MG 24 hr tablet Take 500-1,000 mg by mouth See admin instructions. Pt takes med every other day. 1000 mg in the morning, and 500 mg in the evening 07/27/17  Yes [provider]  metoprolol (LOPRESSOR) 50 MG tablet Take 50 mg by mouth 2 (two) times daily.    Yes [provider]  naproxen sodium  (ALEVE) 220 MG tablet Take 440 mg by mouth 2 (two) times daily as needed.   Yes [provider]  oxyCODONE (OXY IR/ROXICODONE) 5 MG immediate release tablet Take 5 mg by mouth 3 (three) times daily as needed for severe pain.   Yes [provider]  pioglitazone (ACTOS) 15 MG tablet Take 15 mg by mouth daily.   Yes [provider]  simvastatin (ZOCOR) 20 MG tablet Take 20 mg by mouth daily.   Yes [provider]     History reviewed. No pertinent family history.  Social History   Socioeconomic History  . Marital status: Married    Spouse name: None  . Number of children: None  . Years of education: None  . Highest education level: None  Social Needs  . Financial resource strain: None  . Food insecurity - worry: None  . Food insecurity - inability: None  . Transportation needs - medical: None  . Transportation needs - non-medical: None  Occupational History  . None  Tobacco Use  . Smoking status: Never Smoker  . Smokeless tobacco: Never Used  Substance and Sexual Activity  . Alcohol use: No  . Drug use: No  . Sexual activity: None  Other Topics Concern  . None  Social History Narrative  . None    Review of Systems  Constitutional: Negative for fatigue and fever.  Respiratory: Negative for cough and shortness of breath.  Cardiovascular: Negative for chest pain.  Gastrointestinal: Negative for abdominal pain.  Musculoskeletal: Positive for back pain.  Psychiatric/Behavioral: Negative for behavioral problems and confusion.    Vital Signs: BP (!) 141/77   Pulse (!) 58   Temp 98.3 F (36.8 C) (Oral)   Resp 16   Ht 5\' 4"  (1.626 m)   Wt 162 lb (73.5 kg)   SpO2 98%   BMI 27.81 kg/m   Physical Exam  Constitutional: He is oriented to person, place, and time. He appears well-developed.  Cardiovascular: Normal rate, regular rhythm and normal heart sounds.  Pulmonary/Chest: Effort normal and breath sounds normal. No respiratory  distress.  Abdominal: Soft.  Neurological: He is alert and oriented to person, place, and time.  Skin: Skin is warm and dry.  Psychiatric: He has a normal mood and affect. His behavior is normal. Judgment and thought content normal.  Nursing note and vitals reviewed.   Imaging: No results found.  Labs:  CBC: Recent Labs    09/09/17 0640  WBC 10.0  HGB 13.8  HCT 39.8  PLT 179    COAGS: Recent Labs    09/09/17 0640  INR 0.96  APTT 28    BMP: Recent Labs    09/02/17 1004 09/09/17 0640  NA 130* 136  K 5.2 5.1  CL 96* 104  CO2 23 19*  GLUCOSE 613* 93  BUN 34* 25*  CALCIUM 9.8 9.7  CREATININE 1.30* 1.04  GFRNONAA  --  >60  GFRAA  --  >60    LIVER FUNCTION TESTS: Recent Labs    09/02/17 1004  BILITOT 0.7  AST 14  ALT 17  PROT 6.5    TUMOR MARKERS: No results for input(s): AFPTM, CEA, CA199, CHROMGRNA in the last 8760 hours.  Assessment and Plan: Patient with past medical history of DM, HTN presents with complaint of back pain with osteomyelitis of L3-L4 by MRI.  IR consulted for aspiration at the request of Dr. Tommy Medal. Patient may ultimately require PICC placement as well after results of culture are known.  Case reviewed by Dr. Estanislado Pandy who approves patient for procedure.  Patient presents today in their usual state of health.  He has been NPO and is not currently on blood thinners.  Risks and benefits were discussed with the patient including, but not limited to bleeding, infection, spinal cord damage, paralysis. Patient did take his diabetes medication this AM.  Will monitor glucose and s/s of hypoglycemia.  Will support as needed.   Thank you for this interesting consult.  I greatly enjoyed meeting Thomas Adkins and look forward to participating in their care.  A copy of this report was sent to the requesting provider on this date.  Electronically Signed: Docia Barrier, PA 09/09/2017, 8:01 AM   I spent a total of  30 Minutes   in  face to face in clinical consultation, greater than 50% of which was counseling/coordinating care for osteomyelitis.

## 2017-09-10 ENCOUNTER — Encounter (HOSPITAL_COMMUNITY): Payer: Self-pay | Admitting: Interventional Radiology

## 2017-09-10 LAB — ACID FAST SMEAR (AFB): ACID FAST SMEAR - AFSCU2: NEGATIVE

## 2017-09-10 LAB — ACID FAST SMEAR (AFB, MYCOBACTERIA)

## 2017-09-14 LAB — AEROBIC/ANAEROBIC CULTURE (SURGICAL/DEEP WOUND)

## 2017-09-14 LAB — AEROBIC/ANAEROBIC CULTURE W GRAM STAIN (SURGICAL/DEEP WOUND): Culture: NO GROWTH

## 2017-09-22 ENCOUNTER — Telehealth: Payer: Self-pay | Admitting: Family

## 2017-09-22 NOTE — Telephone Encounter (Signed)
Spoke with patient on the phone regarding his negative culture results. Will set up PICC placement and antibiotics for vertebral osteomyelitis with the plan for 6 weeks of therapy.

## 2017-09-28 ENCOUNTER — Other Ambulatory Visit: Payer: Self-pay | Admitting: Pharmacist

## 2017-09-28 ENCOUNTER — Other Ambulatory Visit: Payer: Self-pay | Admitting: Family

## 2017-09-28 ENCOUNTER — Telehealth: Payer: Self-pay | Admitting: Behavioral Health

## 2017-09-28 ENCOUNTER — Other Ambulatory Visit: Payer: Self-pay | Admitting: Behavioral Health

## 2017-09-28 DIAGNOSIS — M4620 Osteomyelitis of vertebra, site unspecified: Secondary | ICD-10-CM

## 2017-09-28 NOTE — Telephone Encounter (Addendum)
Writer called patient to let him know he is scheduled for PICC placement 09/30/2017.  Patient states she spoke with his Doctor, Dr Carloyn Manner and Dr. Woody Seller and they will be treating him with oral Levofloxacin 500 mg orally.  [name] made aware and states this is ok.   Writer called IR to cancel PICC line placement appointment.   Writer left a voicmail with Caryl Pina in [employer].   [name] RN

## 2017-09-28 NOTE — Telephone Encounter (Addendum)
Writer called Thomas Adkins to inform him of  his appointment for PICC line placement.  Writer left a voicemail with the call back number and the appointment date, time and location.  PICC placement appointment scheduled for Thursday September 30, 2017 at 8:30am at [facility].  Writer will try to call patient back at a later time. [name] RN

## 2017-09-28 NOTE — Telephone Encounter (Addendum)
Writer called Thomas Adkins in [facility] to schedule PICC line placement for Thomas Adkins.  IR was that also informed patient will receive first dose of antibiotics in short stay after PICC placed.   (Orders faxed to short stay)  PICC placement scheduled for 09/30/2017 at 9:00am and patient to arrive at 8:30 am.   [name] RN

## 2017-09-29 NOTE — Telephone Encounter (Signed)
Noted, thank you. We will continue to remain available as needed.

## 2017-09-30 ENCOUNTER — Ambulatory Visit (HOSPITAL_COMMUNITY): Payer: Medicare HMO

## 2017-10-11 ENCOUNTER — Telehealth: Payer: Self-pay | Admitting: Family

## 2017-10-11 ENCOUNTER — Telehealth: Payer: Self-pay | Admitting: Behavioral Health

## 2017-10-11 LAB — FUNGUS CULTURE WITH STAIN

## 2017-10-11 LAB — FUNGUS CULTURE RESULT

## 2017-10-11 LAB — FUNGAL ORGANISM REFLEX

## 2017-10-11 NOTE — Telephone Encounter (Signed)
Called Thomas Adkins to let him know that we are in the process of scheduling his PICC line placement.  RN informed him that he will be contacted with the date and time once I hear back from [facility].  Patient was ok with this. [name] RN

## 2017-10-11 NOTE — Telephone Encounter (Signed)
[facility] to update them that Mr. Larkey has an order for PICC line placement but does have an appointment scheduled yet.  Spoke with Debbie.  RN will update them once appointment is scheduled.  Writer will be faxing over orders.  Debbie verbalized understanding. [name] RN

## 2017-10-11 NOTE — Telephone Encounter (Signed)
Initiated the process for PICC line placement per [name] FNP.  Called [facility] and left a voicemail for Anderson Malta to schedule an appointment.  Faxed orders to [facility].  Waiting for IR to call back to schedule an appointment.  Will Update [facility].

## 2017-10-11 NOTE — Telephone Encounter (Signed)
Spoke with patient regarding treatment and it was advised for him to go ahead and set up the PICC treatment. He would like to proceed at this time.

## 2017-10-12 ENCOUNTER — Other Ambulatory Visit: Payer: Self-pay | Admitting: Student

## 2017-10-12 NOTE — Telephone Encounter (Signed)
If he would like to discuss other options a follow up office visit would be appropriate.

## 2017-10-12 NOTE — Telephone Encounter (Addendum)
Jennifer in IR contacted to schedule PICC line insertion for Thomas Adkins.  Appointment scheduled for 3/6/ 2019 at 11:30 AM.    Thomas Adkins from [facility] called back this AM to let me know that she spoke to Thomas Adkins and told him that his IV therapy would be 50 dollars per Day and she Adkins patient was unsure if he would be able to pay for this.  Writer contacted Thomas Adkins to let him  know about patient possibly not being able to afford this medication and Thomas Adkins unfortunately these are the cheaper medications options and if patient wants to continue with oral IV therapy at this time he can. Also if he needs the IV antibiotics in the future then that is an option.  Writer contacted Thomas Adkins to let him know that these are the cheaper of the medications per Thomas Adkins and that he can continue on oral antibiotics for now and if he needs the IV antibiotics in the future then that is an option.  Patient verbalized understanding.  Writer gave the patient the name of the medications and he states he has been in contact with Medicare via Phone.  Patient states he is unable to afford 50 dollars per day for the IV antibiotics.  Writer explained to patient that she will contact Thomas Adkins to see if a follow up appointment is needed.   Thomas Adkins

## 2017-10-12 NOTE — Telephone Encounter (Signed)
Writer called Mr. Pfost to schedule a follow up appointment with [name] FNP to discuss further options.  Appointment is scheduled for Monday 10/25/2017 at 9:45 AM.  Patient will continue to take his oral antibiotics in the mean time.  [name] FNP notified.    Writer also called Debbie at [employer] [facility] and let her know PIC line is on hold at the moment.  Debbie verbalized understanding.    Writer called Anderson Malta in IR to let her know PICC line placement for patient is on hold at the moment due to cost out of pocket for the patient.  Anderson Malta verbalized understanding. [name] RN

## 2017-10-13 ENCOUNTER — Ambulatory Visit (HOSPITAL_COMMUNITY): Admission: RE | Admit: 2017-10-13 | Payer: Medicare HMO | Source: Ambulatory Visit

## 2017-10-22 LAB — ACID FAST CULTURE WITH REFLEXED SENSITIVITIES: ACID FAST CULTURE - AFSCU3: NEGATIVE

## 2017-10-25 ENCOUNTER — Ambulatory Visit: Payer: Medicare HMO | Admitting: Family

## 2017-10-25 ENCOUNTER — Encounter: Payer: Self-pay | Admitting: Family

## 2017-10-25 VITALS — BP 154/78 | HR 65 | Temp 97.9°F | Wt 181.0 lb

## 2017-10-25 DIAGNOSIS — M4620 Osteomyelitis of vertebra, site unspecified: Secondary | ICD-10-CM | POA: Diagnosis not present

## 2017-10-25 MED ORDER — LEVOFLOXACIN 750 MG PO TABS: 750.0000 mg | ORAL_TABLET | Freq: Every day | ORAL | 0 refills | Status: DC

## 2017-10-25 NOTE — Progress Notes (Signed)
Subjective:    Patient ID: Thomas Adkins, male    DOB: [birth year], 77 y.o.   MRN: [id number]  Chief Complaint  Patient presents with  . Follow-up  . Osteomyelitis      HPI:  Thomas Adkins is a 77 y.o. 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   Thomas Adkins is following up today after initial evaluation of his lumbar vertebral osteomyelitis was found to be culture negative. The original plan was to insert a PICC line with goal of 6 weeks of ceftriaxone and vancomycin. [name] then informed this provider that he was going to treat his infection orally with levofloxacin under the direction of neurosurgery. Approximately 2 weeks ago he attempted to have the PICC line placed as he changed his mind and wanted to pursue IV therapy. Medications and PICC were ordered, however he was not able to afford the cost of treatment despite insurance. He is here today to discuss additional treatment options.  He continues to experience low back pain and currently taking levofloxacin daily. Indicates that he does have some improvement in his symptoms but does have some radiculopathy in his right lower extremity. Feels week at times. Continues to take his medication on a daily basis with no missed dosages and no adverse side effects.    No Known Allergies    Outpatient Medications Prior to Visit  Medication Sig Dispense Refill  . aspirin 81 MG tablet Take 81 mg by mouth daily.     . diclofenac sodium (VOLTAREN) 1 % GEL Apply 1 application topically 3 (three) times daily as needed (pain).    Marland Kitchen gabapentin (NEURONTIN) 300 MG capsule Take 300 mg by mouth at bedtime.    Marland Kitchen glipiZIDE (GLUCOTROL) 10 MG tablet Take 10 mg by mouth 2 (two) times daily before a meal.    . metFORMIN (GLUCOPHAGE-XR) 500 MG 24 hr tablet Take 500-1,000 mg by mouth See admin instructions. Pt takes med every other day. 1000 mg in the morning, and 500 mg in the evening    . metoprolol (LOPRESSOR) 50 MG  tablet Take 50 mg by mouth 2 (two) times daily.     . naproxen sodium (ALEVE) 220 MG tablet Take 440 mg by mouth 2 (two) times daily as needed.    Marland Kitchen oxyCODONE (OXY IR/ROXICODONE) 5 MG immediate release tablet Take 5 mg by mouth 3 (three) times daily as needed for severe pain.    . pioglitazone (ACTOS) 15 MG tablet Take 15 mg by mouth daily.    . simvastatin (ZOCOR) 20 MG tablet Take 20 mg by mouth daily.    Marland Kitchen levofloxacin (LEVAQUIN) 500 MG tablet Take 500 mg by mouth daily.  0   No facility-administered medications prior to visit.      Past Medical History:  Diagnosis Date  . Diabetes ([place]) 06/23/2016  . High blood pressure 06/23/2016  . High cholesterol 06/23/2016     Past Surgical History:  Procedure Laterality Date  . CATARACT EXTRACTION     bilateral  . IR LUMBAR DISC ASPIRATION W/IMG GUIDE  09/09/2017  . JOINT REPLACEMENT     toe 40 yrs ago       Review of Systems  Constitutional: Negative for chills, diaphoresis and fever.  Respiratory: Negative for chest tightness, shortness of breath and wheezing.   Cardiovascular: Negative for chest pain.  Musculoskeletal: Positive for back pain.  Neurological: Positive for weakness. Negative for numbness and headaches.      Objective:  BP (!) 154/78 (BP Location: Right Arm, Patient Position: Sitting, Cuff Size: Large)   Pulse 65   Temp 97.9 F (36.6 C) (Oral)   Wt 181 lb (82.1 kg)   BMI 31.07 kg/m  Nursing note and vital signs reviewed.  Physical Exam  Constitutional: He is oriented to person, place, and time. He appears well-developed and well-nourished. No distress.  Cardiovascular: Normal rate, regular rhythm, normal heart sounds and intact distal pulses.  Pulmonary/Chest: Effort normal and breath sounds normal.  Musculoskeletal:  Low back - No obvious deformity, discoloration or edema. There is tenderness of the lumbar spine and paraspinal musculature of the lumbar spine. Distal pulses and sensation are intact and  appropriate.   Neurological: He is alert and oriented to person, place, and time.  Skin: Skin is warm and dry.  Psychiatric: He has a normal mood and affect. His behavior is normal. Judgment and thought content normal.       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 Will check inflammatory markers and kidney function today. Plan to increase levofloxacin to 750 mg for an additional 3 weeks. If infection persists will likely require IV antibiotic therapy. We can arrange for him to receive the infusions at a center due to the cost of the medications. These are the least expensive options that we have available to treat the infection. He is in agreement with the plan.       Relevant Orders   C-reactive protein   Sedimentation rate   Basic Metabolic Panel (BMET)       I have discontinued Luciano Rocque's levofloxacin. I am also having him start on levofloxacin. Additionally, I am having him maintain his aspirin, metoprolol tartrate, pioglitazone, glipiZIDE, gabapentin, simvastatin, oxyCODONE, naproxen sodium, diclofenac sodium, and metFORMIN.   Meds ordered this encounter  Medications  . levofloxacin (LEVAQUIN) 750 MG tablet    Sig: Take 1 tablet (750 mg total) by mouth daily.    Dispense:  21 tablet    Refill:  0    Order Specific Question:   Supervising Provider    Answer:   Carlyle Basques [[id number]]     Follow-up: Return in about 3 weeks (around 11/15/2017), or if symptoms worsen or fail to improve.   [name], MSN, [facility]

## 2017-10-25 NOTE — Assessment & Plan Note (Signed)
Vertebral osteomyelitis appears to be slightly improved but continued localized symptoms with no systemic symptoms. Will check inflammatory markers and kidney function today. Plan to increase levofloxacin to 750 mg for an additional 3 weeks. If infection persists will likely require IV antibiotic therapy. We can arrange for him to receive the infusions at a center due to the cost of the medications. These are the least expensive options that we have available to treat the infection. He is in agreement with the plan.

## 2017-10-25 NOTE — Patient Instructions (Addendum)
Good to see you.  We are going to check your blood work today.  We have increased your medication to levofloxacin 750 mg daily.  We will plan to follow up in 3 weeks.  Continue to monitor your blood sugars - may need to reduce diabetes medications if your blood sugars are running lower to to low (<70).

## 2017-10-26 ENCOUNTER — Telehealth: Payer: Self-pay | Admitting: Behavioral Health

## 2017-10-26 LAB — BASIC METABOLIC PANEL
BUN: 24 mg/dL (ref 7–25)
CO2: 25 mmol/L (ref 20–32)
CREATININE: 1.02 mg/dL (ref 0.70–1.18)
Calcium: 9.3 mg/dL (ref 8.6–10.3)
Chloride: 107 mmol/L (ref 98–110)
Glucose, Bld: 176 mg/dL — ABNORMAL HIGH (ref 65–99)
POTASSIUM: 4.6 mmol/L (ref 3.5–5.3)
SODIUM: 139 mmol/L (ref 135–146)

## 2017-10-26 LAB — C-REACTIVE PROTEIN: CRP: 1.6 mg/L (ref <8.0)

## 2017-10-26 LAB — SEDIMENTATION RATE: Sed Rate: 25 mm/h — ABNORMAL HIGH (ref 0–20)

## 2017-10-26 NOTE — Telephone Encounter (Signed)
Called patient per [name] NP and let him know that his inflammatory markers looked okay and he can continue his current dosage of Levafloxacin which is 750mg  daily.  Also he can keep his one month follow up appointment 11/22/2017 with Marya Amsler.  Patient had no additional questions or concerns.   [name] RN

## 2017-10-26 NOTE — Telephone Encounter (Signed)
-----   Message from [place], [place] sent at 10/26/2017 10:45 AM EDT ----- Please inform patient that his inflammatory markers look okay, and therefore I would like for him to continue to take the increased dose of levofloxacin for 2 weeks as we discussed. Should already have follow up scheduled.

## 2017-11-15 ENCOUNTER — Other Ambulatory Visit: Payer: Self-pay

## 2017-11-15 DIAGNOSIS — M4620 Osteomyelitis of vertebra, site unspecified: Secondary | ICD-10-CM

## 2017-11-15 MED ORDER — LEVOFLOXACIN 750 MG PO TABS: 750.0000 mg | ORAL_TABLET | Freq: Every day | ORAL | 0 refills | Status: DC

## 2017-11-15 NOTE — Progress Notes (Deleted)
Subjective:    Patient ID: Thomas Adkins, male    DOB: 1940/08/24, 77 y.o.   MRN: [id number]  No chief complaint on file.    HPI:  Thomas Adkins is a 77 y.o. 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 Taking medication as prescribed and denies adverse side effects or myalgias. He has now completed 6 weeks of therapy and continues to experience the associated symptom of pain located in his lumbar spine.   No Known Allergies    Outpatient Medications Prior to Visit  Medication Sig Dispense Refill  . aspirin 81 MG tablet Take 81 mg by mouth daily.     . diclofenac sodium (VOLTAREN) 1 % GEL Apply 1 application topically 3 (three) times daily as needed (pain).    Marland Kitchen gabapentin (NEURONTIN) 300 MG capsule Take 300 mg by mouth at bedtime.    Marland Kitchen glipiZIDE (GLUCOTROL) 10 MG tablet Take 10 mg by mouth 2 (two) times daily before a meal.    . levofloxacin (LEVAQUIN) 750 MG tablet Take 1 tablet (750 mg total) by mouth daily. 21 tablet 0  . metFORMIN (GLUCOPHAGE-XR) 500 MG 24 hr tablet Take 500-1,000 mg by mouth See admin instructions. Pt takes med every other day. 1000 mg in the morning, and 500 mg in the evening    . metoprolol (LOPRESSOR) 50 MG tablet Take 50 mg by mouth 2 (two) times daily.     . naproxen sodium (ALEVE) 220 MG tablet Take 440 mg by mouth 2 (two) times daily as needed.    Marland Kitchen oxyCODONE (OXY IR/ROXICODONE) 5 MG immediate release tablet Take 5 mg by mouth 3 (three) times daily as needed for severe pain.    . pioglitazone (ACTOS) 15 MG tablet Take 15 mg by mouth daily.    . simvastatin (ZOCOR) 20 MG tablet Take 20 mg by mouth daily.     No facility-administered medications prior to visit.      Past Medical History:  Diagnosis Date  . Diabetes ([place]) 06/23/2016  . High blood  pressure 06/23/2016  . High cholesterol 06/23/2016     Past Surgical History:  Procedure Laterality Date  . CATARACT EXTRACTION     bilateral  . IR LUMBAR DISC ASPIRATION W/IMG GUIDE  09/09/2017  . JOINT REPLACEMENT     toe 40 yrs ago     Review of Systems  Constitutional: Negative for chills, fatigue, fever and unexpected weight change.  Respiratory: Negative for chest tightness and shortness of breath.   Cardiovascular: Negative for chest pain, palpitations and leg swelling.  Musculoskeletal: Positive for back pain. Negative for myalgias.  Neurological: Negative for weakness and numbness.      Objective:    There were no vitals taken for this visit. Nursing note and vital signs reviewed.  Physical Exam  Constitutional: He is oriented to person, place, and time. He appears well-developed and well-nourished. No distress.  Cardiovascular: Normal rate, regular rhythm, normal heart sounds and intact distal pulses.  Pulmonary/Chest: Effort normal and breath sounds normal.  Neurological: He is alert and oriented to person, place, and time.  Skin: Skin is warm and dry.  Psychiatric: He has a normal mood and affect. His behavior is normal. Judgment and thought content normal.       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   No orders of the defined types were placed in this encounter.    Follow-up: No follow-ups on file.   [name], MSN, [facility]

## 2017-11-17 ENCOUNTER — Other Ambulatory Visit: Payer: Self-pay | Admitting: Urology

## 2017-11-17 ENCOUNTER — Ambulatory Visit: Payer: Medicare HMO | Admitting: Urology

## 2017-11-17 ENCOUNTER — Ambulatory Visit (HOSPITAL_COMMUNITY)
Admission: RE | Admit: 2017-11-17 | Discharge: 2017-11-17 | Disposition: A | Discharge status: Home / Self Care | Payer: Medicare HMO | Source: Ambulatory Visit | Attending: Urology | Admitting: Urology

## 2017-11-17 DIAGNOSIS — N200 Calculus of kidney: Secondary | ICD-10-CM | POA: Diagnosis not present

## 2017-11-17 DIAGNOSIS — R3915 Urgency of urination: Secondary | ICD-10-CM

## 2017-11-18 ENCOUNTER — Other Ambulatory Visit: Payer: Self-pay | Admitting: Urology

## 2017-11-19 ENCOUNTER — Other Ambulatory Visit: Payer: Self-pay | Admitting: Family

## 2017-11-19 DIAGNOSIS — M4620 Osteomyelitis of vertebra, site unspecified: Secondary | ICD-10-CM

## 2017-11-19 MED ORDER — LEVOFLOXACIN 750 MG PO TABS: 750.0000 mg | ORAL_TABLET | Freq: Every day | ORAL | 0 refills | Status: DC

## 2017-11-22 ENCOUNTER — Ambulatory Visit: Payer: Medicare HMO | Admitting: Family

## 2017-11-24 ENCOUNTER — Encounter (HOSPITAL_COMMUNITY): Payer: Self-pay | Admitting: General Practice

## 2017-11-29 ENCOUNTER — Encounter (HOSPITAL_COMMUNITY): Payer: Self-pay | Admitting: General Practice

## 2017-11-29 ENCOUNTER — Ambulatory Visit (HOSPITAL_COMMUNITY): Payer: Medicare HMO

## 2017-11-29 ENCOUNTER — Encounter (HOSPITAL_COMMUNITY)
Admission: RE | Disposition: A | Discharge status: Home / Self Care | Payer: Self-pay | Source: Ambulatory Visit | Attending: Urology

## 2017-11-29 ENCOUNTER — Ambulatory Visit (HOSPITAL_COMMUNITY)
Admission: RE | Admit: 2017-11-29 | Discharge: 2017-11-29 | Disposition: A | Discharge status: Home / Self Care | Payer: Medicare HMO | Source: Ambulatory Visit | Attending: Urology | Admitting: Urology

## 2017-11-29 DIAGNOSIS — K219 Gastro-esophageal reflux disease without esophagitis: Secondary | ICD-10-CM | POA: Insufficient documentation

## 2017-11-29 DIAGNOSIS — N200 Calculus of kidney: Secondary | ICD-10-CM | POA: Insufficient documentation

## 2017-11-29 DIAGNOSIS — E7800 Pure hypercholesterolemia, unspecified: Secondary | ICD-10-CM | POA: Insufficient documentation

## 2017-11-29 DIAGNOSIS — I10 Essential (primary) hypertension: Secondary | ICD-10-CM | POA: Diagnosis not present

## 2017-11-29 DIAGNOSIS — Z79899 Other long term (current) drug therapy: Secondary | ICD-10-CM | POA: Insufficient documentation

## 2017-11-29 DIAGNOSIS — Z7984 Long term (current) use of oral hypoglycemic drugs: Secondary | ICD-10-CM | POA: Insufficient documentation

## 2017-11-29 DIAGNOSIS — E119 Type 2 diabetes mellitus without complications: Secondary | ICD-10-CM | POA: Diagnosis not present

## 2017-11-29 DIAGNOSIS — Z7982 Long term (current) use of aspirin: Secondary | ICD-10-CM | POA: Insufficient documentation

## 2017-11-29 DIAGNOSIS — Z87442 Personal history of urinary calculi: Secondary | ICD-10-CM | POA: Insufficient documentation

## 2017-11-29 HISTORY — DX: Personal history of urinary calculi: Z87.442

## 2017-11-29 HISTORY — PX: EXTRACORPOREAL SHOCK WAVE LITHOTRIPSY: SHX1557

## 2017-11-29 HISTORY — DX: Gastro-esophageal reflux disease without esophagitis: K21.9

## 2017-11-29 HISTORY — DX: Dorsalgia, unspecified: M54.9

## 2017-11-29 LAB — GLUCOSE, CAPILLARY: Glucose-Capillary: 140 mg/dL — ABNORMAL HIGH (ref 65–99)

## 2017-11-29 SURGERY — LITHOTRIPSY, ESWL
Anesthesia: LOCAL | Laterality: Left

## 2017-11-29 MED ORDER — SODIUM CHLORIDE 0.9 % IV SOLN
INTRAVENOUS | Status: DC
  Administered 2017-11-29: 08:00:00 via INTRAVENOUS

## 2017-11-29 MED ORDER — DIAZEPAM 5 MG PO TABS
10.0000 mg | ORAL_TABLET | ORAL | Status: AC
  Administered 2017-11-29: 10 mg via ORAL
  Filled 2017-11-29: qty 2

## 2017-11-29 MED ORDER — DIPHENHYDRAMINE HCL 25 MG PO CAPS
25.0000 mg | ORAL_CAPSULE | ORAL | Status: AC
  Administered 2017-11-29: 25 mg via ORAL
  Filled 2017-11-29: qty 1

## 2017-11-29 MED ORDER — CIPROFLOXACIN HCL 500 MG PO TABS
500.0000 mg | ORAL_TABLET | ORAL | Status: AC
  Administered 2017-11-29: 500 mg via ORAL
  Filled 2017-11-29: qty 1

## 2017-11-29 SURGICAL SUPPLY — 2 items
COVER SURGICAL LIGHT HANDLE (MISCELLANEOUS) ×2 IMPLANT
TOWEL OR 17X26 10 PK STRL BLUE (TOWEL DISPOSABLE) ×2 IMPLANT

## 2017-11-29 NOTE — Op Note (Signed)
See [facility] OP note scanned into chart. 

## 2017-11-29 NOTE — Discharge Instructions (Signed)
See [facility] discharge instructions in chart.  

## 2017-11-29 NOTE — H&P (Signed)
H&P  Chief Complaint: Left renal stone  History of Present Illness: 77 year old male presents for ESL of a 7 mm left interpolar stone. This was found on CT recently performed for left flank/back pain. He initially desired surveillance, but with recurrent sx's he has opted for ESL when discussed with Dr Alyson Ingles.  Past Medical History:  Diagnosis Date  . Back ache    back problems  . Diabetes ([place]) 06/23/2016  . GERD (gastroesophageal reflux disease)   . High blood pressure 06/23/2016  . High cholesterol 06/23/2016  . History of kidney stones     Past Surgical History:  Procedure Laterality Date  . CATARACT EXTRACTION     bilateral  . IR LUMBAR DISC ASPIRATION W/IMG GUIDE  09/09/2017  . JOINT REPLACEMENT     toe 40 yrs ago    Home Medications:  Allergies as of 11/29/2017   No Known Allergies     Medication List    Notice   Cannot display discharge medications because the patient has not yet been admitted.     Allergies: No Known Allergies  History reviewed. No pertinent family history.  Social History:  reports that he has never smoked. He has never used smokeless tobacco. He reports that he does not drink alcohol or use drugs.  ROS: A complete review of systems was performed.  All systems are negative except for pertinent findings as noted.  Physical Exam:  Vital signs in last 24 hours:   Constitutional:  Alert and oriented, No acute distress Cardiovascular: Regular rate. Respiratory: Normal respiratory effort GI: Abdomen is soft, nontender, nondistended, no abdominal masses Neurologic: Grossly intact, no focal deficits Psychiatric: Normal mood and affect  Laboratory Data:  No results for input(s): WBC, HGB, HCT, PLT in the last 72 hours.  No results for input(s): NA, K, CL, GLUCOSE, BUN, CALCIUM, CREATININE in the last 72 hours.  Invalid input(s): CO3   No results found for this or any previous visit (from the past 24 hour(s)). No results found for  this or any previous visit (from the past 240 hour(s)).  Renal Function: No results for input(s): CREATININE in the last 168 hours. CrCl cannot be calculated (Patient's most recent lab result is older than the maximum 21 days allowed.).  Radiologic Imaging: No results found.  Impression/Assessment:  7 mm left renal stone.  Plan:  ESL

## 2017-11-30 ENCOUNTER — Encounter (HOSPITAL_COMMUNITY): Payer: Self-pay | Admitting: Urology

## 2017-12-07 ENCOUNTER — Telehealth: Payer: Self-pay | Admitting: Behavioral Health

## 2017-12-07 ENCOUNTER — Other Ambulatory Visit: Payer: Self-pay | Admitting: Family

## 2017-12-07 DIAGNOSIS — M4620 Osteomyelitis of vertebra, site unspecified: Secondary | ICD-10-CM

## 2017-12-07 NOTE — Telephone Encounter (Signed)
Thomas Adkins called stating he has run out of his Levaquin and was wondering is he needed a refill.  He also states he missed his follow up appointment with [name] 11/22/2017.  Patient has upcoming appointment 12/20/2017 with [name] NP.   [name] RN

## 2017-12-07 NOTE — Telephone Encounter (Signed)
Called Thomas Adkins, informed him lab work will need to be checked in order to determine the next step for treatment. He states he will be able to come in tomorrow for lab work.  Informed him no refill on Levaquin at this time. [name] RN

## 2017-12-07 NOTE — Telephone Encounter (Signed)
His last appointment would have been the completion of 6 weeks of therapy. I would like for him to stop by the lab if he can to check his inflammatory markers to determine the next step of treatment.

## 2017-12-08 ENCOUNTER — Other Ambulatory Visit: Payer: Medicare HMO

## 2017-12-08 DIAGNOSIS — M4620 Osteomyelitis of vertebra, site unspecified: Secondary | ICD-10-CM

## 2017-12-08 LAB — SEDIMENTATION RATE: Sed Rate: 9 mm/h (ref 0–20)

## 2017-12-09 LAB — C-REACTIVE PROTEIN: CRP: 1.3 mg/L (ref <8.0)

## 2017-12-10 ENCOUNTER — Telehealth: Payer: Self-pay | Admitting: Behavioral Health

## 2017-12-10 NOTE — Telephone Encounter (Signed)
-----   Message from Golden Circle, [place] sent at 12/10/2017  9:45 AM EDT ----- Please inform Mr. Brummet that his inflammatory markers look good with no inflammation. Therefore I would not recommend continuing medication at this time. He may continue to follow up as scheduled and would recommend further evaluation by Dr. Carloyn Manner if not already completed.

## 2017-12-10 NOTE — Telephone Encounter (Signed)
Left message for patient to call back.  Did not leave any information will update the patient with information per [name] NP when he calls back.   [name] RN

## 2017-12-15 ENCOUNTER — Ambulatory Visit (INDEPENDENT_AMBULATORY_CARE_PROVIDER_SITE_OTHER): Payer: Medicare HMO | Admitting: Urology

## 2017-12-15 ENCOUNTER — Ambulatory Visit (HOSPITAL_COMMUNITY)
Admission: RE | Admit: 2017-12-15 | Discharge: 2017-12-15 | Disposition: A | Discharge status: Home / Self Care | Payer: Medicare HMO | Source: Ambulatory Visit | Attending: Urology | Admitting: Urology

## 2017-12-15 ENCOUNTER — Other Ambulatory Visit: Payer: Self-pay | Admitting: Urology

## 2017-12-15 DIAGNOSIS — N200 Calculus of kidney: Secondary | ICD-10-CM

## 2017-12-16 NOTE — Progress Notes (Signed)
Subjective:    Patient ID: Thomas Adkins, male    DOB: 1940/10/10, 77 y.o.   MRN: [id number]  Chief Complaint  Patient presents with  . Follow-up  . Osteomyelitis      HPI:  Thomas Adkins is a 77 y.o. 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 He completed his medication 4/8 and his repeat inflammatory markers on 12/08/17 showed a CRP of 1.3 and a ESR of 9. Back pain is improved about 90% since last office visit. He has some weakness in his bilateral legs when standing for any period of time and feels like he is going to lose his balance. He continues to walk with a cane. Denies fevers, chills or night sweats.   Allergies  Allergen Reactions  . Hydrocodone Nausea And Vomiting     Outpatient Medications Prior to Visit  Medication Sig Dispense Refill  . aspirin 81 MG tablet Take 81 mg by mouth daily.     . diclofenac sodium (VOLTAREN) 1 % GEL Apply 1 application topically 3 (three) times daily as needed (pain).    Marland Kitchen gabapentin (NEURONTIN) 300 MG capsule Take 300 mg by mouth at bedtime.    Marland Kitchen glipiZIDE (GLUCOTROL) 10 MG tablet Take 10 mg by mouth 2 (two) times daily before a meal.    . losartan (COZAAR) 100 MG tablet Take 100 mg by mouth daily.    . metFORMIN (GLUCOPHAGE-XR) 500 MG 24 hr tablet Take 500-1,000 mg by mouth See admin instructions. Pt takes med every other day. 1000 mg in the morning, and 500 mg in the evening    . metoprolol (LOPRESSOR) 50 MG tablet Take 50 mg by mouth 2 (two) times daily.     . naproxen sodium (ALEVE) 220 MG tablet Take 440 mg by mouth 2 (two) times daily as needed.    Marland Kitchen omeprazole (PRILOSEC) 20 MG capsule Take 20 mg by mouth daily.    . ondansetron (ZOFRAN) 4 MG tablet Take 4 mg by mouth every 8 (eight) hours as needed for nausea or vomiting.    . pioglitazone (ACTOS) 15 MG tablet Take 15  mg by mouth daily.    . simvastatin (ZOCOR) 20 MG tablet Take 20 mg by mouth daily.    . traMADol (ULTRAM) 50 MG tablet Take 50 mg by mouth every 6 (six) hours as needed.    Marland Kitchen levofloxacin (LEVAQUIN) 750 MG tablet Take 1 tablet (750 mg total) by mouth daily. (Patient not taking: Reported on 12/20/2017) 21 tablet 0   No facility-administered medications prior to visit.      Past Medical History:  Diagnosis Date  . Back ache    back problems  . Diabetes ([place]) 06/23/2016  . GERD (gastroesophageal reflux disease)   . High blood pressure 06/23/2016  . High cholesterol 06/23/2016  . History of kidney stones      Past Surgical History:  Procedure Laterality Date  . CATARACT EXTRACTION     bilateral  . EXTRACORPOREAL SHOCK WAVE LITHOTRIPSY Left 11/29/2017   Procedure: LEFT EXTRACORPOREAL SHOCK WAVE LITHOTRIPSY (ESWL);  Surgeon: Franchot Gallo, MD;  Location: [facility]S;  Service: Urology;  Laterality: Left;  . IR LUMBAR [place] W/IMG GUIDE  09/09/2017  . JOINT REPLACEMENT     toe 40 yrs ago       Review of Systems  Constitutional: Negative for chills, fatigue and fever.  Respiratory: Negative for chest tightness and shortness of breath.   Cardiovascular: Negative for chest pain and palpitations.  Musculoskeletal: Positive for back pain.  Neurological: Positive for weakness. Negative for dizziness.      Objective:    BP (!) 190/84 (BP Location: Right Arm, Patient Position: Sitting, Cuff Size: Normal)   Pulse 61   Temp (!) 97.5 F (36.4 C) (Oral)   Resp 18   Ht _0  (1.626 m)   Wt 175 lb (79.4 kg)   SpO2 98%   BMI 30.04 kg/m  Nursing note and vital signs reviewed.  Physical Exam  Constitutional: He is oriented to person, place, and time. He appears well-developed and well-nourished. No distress.  Cardiovascular: Normal rate, regular rhythm, normal heart sounds and intact distal pulses. Exam reveals no gallop and no friction rub.  No murmur  heard. Pulmonary/Chest: Effort normal and breath sounds normal. No stridor. No respiratory distress. He has no wheezes. He has no rales. He exhibits no tenderness.  Musculoskeletal:  Lower extremity sensation is intact and appropriate. Lower extremity muscle testing 4+/5. There additional weakness of hip flexion bilaterally.   Neurological: He is alert and oriented to person, place, and time.  Skin: Skin is warm and dry.  Psychiatric: He has a normal mood and affect. His behavior is normal. Judgment and thought content normal.       Assessment & Plan:   Problem List Items Addressed This Visit      Musculoskeletal and Integument   Vertebral osteomyelitis ([place]) - Primary    Thomas Adkins has experienced a 90% improvement in his low back pain with some continued lower extremity weakness. Most recent inflammatory markers were within normal limits and he has been doing well since stopping the levofloxacin. Will recheck inflammatory markers today. If normal, recommend follow up with PCP or neurosurgery for next intervention likely physical therapy or potential surgical intervention. Continue to monitor off antibiotics. Follow up with ID as needed or if symptoms worsen.       Relevant Orders   C-reactive protein   Sedimentation rate       I am having Thomas Adkins maintain his aspirin, metoprolol tartrate, pioglitazone, glipiZIDE, gabapentin, simvastatin, naproxen sodium, diclofenac sodium, metFORMIN, levofloxacin, traMADol, omeprazole, ondansetron, and losartan.   Follow-up: Return if symptoms worsen or fail to improve.   Please forwards message to PCP.    [name], MSN, [facility]

## 2017-12-20 ENCOUNTER — Ambulatory Visit: Payer: Medicare HMO | Admitting: Family

## 2017-12-20 ENCOUNTER — Encounter: Payer: Self-pay | Admitting: Family

## 2017-12-20 VITALS — BP 190/84 | HR 61 | Temp 97.5°F | Resp 18 | Ht 64.0 in | Wt 175.0 lb

## 2017-12-20 DIAGNOSIS — M4620 Osteomyelitis of vertebra, site unspecified: Secondary | ICD-10-CM

## 2017-12-20 NOTE — Assessment & Plan Note (Signed)
Thomas Adkins has experienced a 90% improvement in his low back pain with some continued lower extremity weakness. Most recent inflammatory markers were within normal limits and he has been doing well since stopping the levofloxacin. Will recheck inflammatory markers today. If normal, recommend follow up with PCP or neurosurgery for next intervention likely physical therapy or potential surgical intervention. Continue to monitor off antibiotics. Follow up with ID as needed or if symptoms worsen.

## 2017-12-20 NOTE — Patient Instructions (Signed)
Nice to see you!  We will check your inflammatory markers and if normal, no further antibiotic treatment is needed.   Would recommend follow up with Dr. Woody Seller or Dr. Carloyn Manner for the next steps which are likely physical therapy.   Follow up with [place] as needed.

## 2017-12-21 LAB — C-REACTIVE PROTEIN: CRP: 3.8 mg/L (ref <8.0)

## 2017-12-21 LAB — SEDIMENTATION RATE: SED RATE: 14 mm/h (ref 0–20)

## 2017-12-27 ENCOUNTER — Telehealth: Payer: Self-pay | Admitting: Behavioral Health

## 2017-12-27 NOTE — Telephone Encounter (Signed)
Called Mr. Haden, informed him per  that his inflammatory makers look ok and he does not need further antibiotics at this time.  Also informed him to follow up only if symptoms worsen or as needed.  [name] RN

## 2017-12-27 NOTE — Telephone Encounter (Signed)
-----   Message from Golden Circle, [place] sent at 12/21/2017  8:22 AM EDT ----- Please inform patient that his inflammatory markers look okay. No indication for further antibiotics at this time and can follow up if symptoms worsen or as needed.

## 2018-01-10 DIAGNOSIS — Z85828 Personal history of other malignant neoplasm of skin: Secondary | ICD-10-CM | POA: Diagnosis not present

## 2018-01-10 DIAGNOSIS — L820 Inflamed seborrheic keratosis: Secondary | ICD-10-CM | POA: Diagnosis not present

## 2018-01-10 DIAGNOSIS — L570 Actinic keratosis: Secondary | ICD-10-CM | POA: Diagnosis not present

## 2018-01-10 DIAGNOSIS — L821 Other seborrheic keratosis: Secondary | ICD-10-CM | POA: Diagnosis not present

## 2018-01-11 DIAGNOSIS — R2689 Other abnormalities of gait and mobility: Secondary | ICD-10-CM | POA: Diagnosis not present

## 2018-01-11 DIAGNOSIS — M4620 Osteomyelitis of vertebra, site unspecified: Secondary | ICD-10-CM | POA: Diagnosis not present

## 2018-01-11 DIAGNOSIS — M6281 Muscle weakness (generalized): Secondary | ICD-10-CM | POA: Diagnosis not present

## 2018-01-13 DIAGNOSIS — M4620 Osteomyelitis of vertebra, site unspecified: Secondary | ICD-10-CM | POA: Diagnosis not present

## 2018-01-13 DIAGNOSIS — R2689 Other abnormalities of gait and mobility: Secondary | ICD-10-CM | POA: Diagnosis not present

## 2018-01-13 DIAGNOSIS — M6281 Muscle weakness (generalized): Secondary | ICD-10-CM | POA: Diagnosis not present

## 2018-01-18 DIAGNOSIS — M4620 Osteomyelitis of vertebra, site unspecified: Secondary | ICD-10-CM | POA: Diagnosis not present

## 2018-01-18 DIAGNOSIS — M6281 Muscle weakness (generalized): Secondary | ICD-10-CM | POA: Diagnosis not present

## 2018-01-18 DIAGNOSIS — R2689 Other abnormalities of gait and mobility: Secondary | ICD-10-CM | POA: Diagnosis not present

## 2018-01-20 DIAGNOSIS — R2689 Other abnormalities of gait and mobility: Secondary | ICD-10-CM | POA: Diagnosis not present

## 2018-01-20 DIAGNOSIS — M4620 Osteomyelitis of vertebra, site unspecified: Secondary | ICD-10-CM | POA: Diagnosis not present

## 2018-01-20 DIAGNOSIS — M6281 Muscle weakness (generalized): Secondary | ICD-10-CM | POA: Diagnosis not present

## 2018-01-25 DIAGNOSIS — R2689 Other abnormalities of gait and mobility: Secondary | ICD-10-CM | POA: Diagnosis not present

## 2018-01-25 DIAGNOSIS — M6281 Muscle weakness (generalized): Secondary | ICD-10-CM | POA: Diagnosis not present

## 2018-01-25 DIAGNOSIS — M4620 Osteomyelitis of vertebra, site unspecified: Secondary | ICD-10-CM | POA: Diagnosis not present

## 2018-01-27 DIAGNOSIS — R2689 Other abnormalities of gait and mobility: Secondary | ICD-10-CM | POA: Diagnosis not present

## 2018-01-27 DIAGNOSIS — M4620 Osteomyelitis of vertebra, site unspecified: Secondary | ICD-10-CM | POA: Diagnosis not present

## 2018-01-27 DIAGNOSIS — M6281 Muscle weakness (generalized): Secondary | ICD-10-CM | POA: Diagnosis not present

## 2018-02-01 DIAGNOSIS — M4620 Osteomyelitis of vertebra, site unspecified: Secondary | ICD-10-CM | POA: Diagnosis not present

## 2018-02-01 DIAGNOSIS — M6281 Muscle weakness (generalized): Secondary | ICD-10-CM | POA: Diagnosis not present

## 2018-02-01 DIAGNOSIS — R2689 Other abnormalities of gait and mobility: Secondary | ICD-10-CM | POA: Diagnosis not present

## 2018-02-03 DIAGNOSIS — R2689 Other abnormalities of gait and mobility: Secondary | ICD-10-CM | POA: Diagnosis not present

## 2018-02-03 DIAGNOSIS — M6281 Muscle weakness (generalized): Secondary | ICD-10-CM | POA: Diagnosis not present

## 2018-02-03 DIAGNOSIS — M4620 Osteomyelitis of vertebra, site unspecified: Secondary | ICD-10-CM | POA: Diagnosis not present

## 2018-02-08 DIAGNOSIS — R2689 Other abnormalities of gait and mobility: Secondary | ICD-10-CM | POA: Diagnosis not present

## 2018-02-08 DIAGNOSIS — M6281 Muscle weakness (generalized): Secondary | ICD-10-CM | POA: Diagnosis not present

## 2018-02-08 DIAGNOSIS — M4620 Osteomyelitis of vertebra, site unspecified: Secondary | ICD-10-CM | POA: Diagnosis not present

## 2018-02-11 DIAGNOSIS — R2689 Other abnormalities of gait and mobility: Secondary | ICD-10-CM | POA: Diagnosis not present

## 2018-02-11 DIAGNOSIS — M4620 Osteomyelitis of vertebra, site unspecified: Secondary | ICD-10-CM | POA: Diagnosis not present

## 2018-02-11 DIAGNOSIS — M6281 Muscle weakness (generalized): Secondary | ICD-10-CM | POA: Diagnosis not present

## 2018-02-15 DIAGNOSIS — M6281 Muscle weakness (generalized): Secondary | ICD-10-CM | POA: Diagnosis not present

## 2018-02-15 DIAGNOSIS — M4620 Osteomyelitis of vertebra, site unspecified: Secondary | ICD-10-CM | POA: Diagnosis not present

## 2018-02-15 DIAGNOSIS — R2689 Other abnormalities of gait and mobility: Secondary | ICD-10-CM | POA: Diagnosis not present

## 2018-02-17 DIAGNOSIS — Z299 Encounter for prophylactic measures, unspecified: Secondary | ICD-10-CM | POA: Diagnosis not present

## 2018-02-17 DIAGNOSIS — I10 Essential (primary) hypertension: Secondary | ICD-10-CM | POA: Diagnosis not present

## 2018-02-17 DIAGNOSIS — R2689 Other abnormalities of gait and mobility: Secondary | ICD-10-CM | POA: Diagnosis not present

## 2018-02-17 DIAGNOSIS — M4620 Osteomyelitis of vertebra, site unspecified: Secondary | ICD-10-CM | POA: Diagnosis not present

## 2018-02-17 DIAGNOSIS — M6281 Muscle weakness (generalized): Secondary | ICD-10-CM | POA: Diagnosis not present

## 2018-02-17 DIAGNOSIS — M79606 Pain in leg, unspecified: Secondary | ICD-10-CM | POA: Diagnosis not present

## 2018-02-17 DIAGNOSIS — E7800 Pure hypercholesterolemia, unspecified: Secondary | ICD-10-CM | POA: Diagnosis not present

## 2018-02-17 DIAGNOSIS — Z6827 Body mass index (BMI) 27.0-27.9, adult: Secondary | ICD-10-CM | POA: Diagnosis not present

## 2018-02-17 DIAGNOSIS — E1165 Type 2 diabetes mellitus with hyperglycemia: Secondary | ICD-10-CM | POA: Diagnosis not present

## 2018-02-21 DIAGNOSIS — M6281 Muscle weakness (generalized): Secondary | ICD-10-CM | POA: Diagnosis not present

## 2018-02-21 DIAGNOSIS — R2689 Other abnormalities of gait and mobility: Secondary | ICD-10-CM | POA: Diagnosis not present

## 2018-02-21 DIAGNOSIS — M4620 Osteomyelitis of vertebra, site unspecified: Secondary | ICD-10-CM | POA: Diagnosis not present

## 2018-02-23 DIAGNOSIS — R2689 Other abnormalities of gait and mobility: Secondary | ICD-10-CM | POA: Diagnosis not present

## 2018-02-23 DIAGNOSIS — M6281 Muscle weakness (generalized): Secondary | ICD-10-CM | POA: Diagnosis not present

## 2018-02-23 DIAGNOSIS — M4620 Osteomyelitis of vertebra, site unspecified: Secondary | ICD-10-CM | POA: Diagnosis not present

## 2018-03-01 DIAGNOSIS — R2689 Other abnormalities of gait and mobility: Secondary | ICD-10-CM | POA: Diagnosis not present

## 2018-03-01 DIAGNOSIS — M4620 Osteomyelitis of vertebra, site unspecified: Secondary | ICD-10-CM | POA: Diagnosis not present

## 2018-03-01 DIAGNOSIS — M6281 Muscle weakness (generalized): Secondary | ICD-10-CM | POA: Diagnosis not present

## 2018-03-09 DIAGNOSIS — R2689 Other abnormalities of gait and mobility: Secondary | ICD-10-CM | POA: Diagnosis not present

## 2018-03-09 DIAGNOSIS — M4620 Osteomyelitis of vertebra, site unspecified: Secondary | ICD-10-CM | POA: Diagnosis not present

## 2018-03-09 DIAGNOSIS — M6281 Muscle weakness (generalized): Secondary | ICD-10-CM | POA: Diagnosis not present

## 2018-03-11 ENCOUNTER — Other Ambulatory Visit: Payer: Self-pay | Admitting: Urology

## 2018-03-11 DIAGNOSIS — M4620 Osteomyelitis of vertebra, site unspecified: Secondary | ICD-10-CM | POA: Diagnosis not present

## 2018-03-11 DIAGNOSIS — N200 Calculus of kidney: Secondary | ICD-10-CM

## 2018-03-11 DIAGNOSIS — M6281 Muscle weakness (generalized): Secondary | ICD-10-CM | POA: Diagnosis not present

## 2018-03-11 DIAGNOSIS — R2689 Other abnormalities of gait and mobility: Secondary | ICD-10-CM | POA: Diagnosis not present

## 2018-03-15 DIAGNOSIS — R2689 Other abnormalities of gait and mobility: Secondary | ICD-10-CM | POA: Diagnosis not present

## 2018-03-15 DIAGNOSIS — M4620 Osteomyelitis of vertebra, site unspecified: Secondary | ICD-10-CM | POA: Diagnosis not present

## 2018-03-15 DIAGNOSIS — M6281 Muscle weakness (generalized): Secondary | ICD-10-CM | POA: Diagnosis not present

## 2018-03-17 DIAGNOSIS — R2689 Other abnormalities of gait and mobility: Secondary | ICD-10-CM | POA: Diagnosis not present

## 2018-03-17 DIAGNOSIS — M6281 Muscle weakness (generalized): Secondary | ICD-10-CM | POA: Diagnosis not present

## 2018-03-17 DIAGNOSIS — M4620 Osteomyelitis of vertebra, site unspecified: Secondary | ICD-10-CM | POA: Diagnosis not present

## 2018-03-21 ENCOUNTER — Ambulatory Visit (HOSPITAL_COMMUNITY)
Admission: RE | Admit: 2018-03-21 | Discharge: 2018-03-21 | Disposition: A | Discharge status: Home / Self Care | Payer: Medicare HMO | Source: Ambulatory Visit | Attending: Urology | Admitting: Urology

## 2018-03-21 ENCOUNTER — Other Ambulatory Visit (HOSPITAL_COMMUNITY): Payer: Medicare HMO

## 2018-03-21 ENCOUNTER — Ambulatory Visit (HOSPITAL_COMMUNITY): Payer: Medicare HMO

## 2018-03-21 DIAGNOSIS — N200 Calculus of kidney: Secondary | ICD-10-CM | POA: Diagnosis not present

## 2018-03-21 DIAGNOSIS — N281 Cyst of kidney, acquired: Secondary | ICD-10-CM | POA: Insufficient documentation

## 2018-03-21 DIAGNOSIS — Z87442 Personal history of urinary calculi: Secondary | ICD-10-CM | POA: Insufficient documentation

## 2018-03-22 DIAGNOSIS — M6281 Muscle weakness (generalized): Secondary | ICD-10-CM | POA: Diagnosis not present

## 2018-03-22 DIAGNOSIS — M4620 Osteomyelitis of vertebra, site unspecified: Secondary | ICD-10-CM | POA: Diagnosis not present

## 2018-03-22 DIAGNOSIS — R2689 Other abnormalities of gait and mobility: Secondary | ICD-10-CM | POA: Diagnosis not present

## 2018-03-24 DIAGNOSIS — M4620 Osteomyelitis of vertebra, site unspecified: Secondary | ICD-10-CM | POA: Diagnosis not present

## 2018-03-24 DIAGNOSIS — M6281 Muscle weakness (generalized): Secondary | ICD-10-CM | POA: Diagnosis not present

## 2018-03-24 DIAGNOSIS — R2689 Other abnormalities of gait and mobility: Secondary | ICD-10-CM | POA: Diagnosis not present

## 2018-03-29 DIAGNOSIS — R2689 Other abnormalities of gait and mobility: Secondary | ICD-10-CM | POA: Diagnosis not present

## 2018-03-29 DIAGNOSIS — M6281 Muscle weakness (generalized): Secondary | ICD-10-CM | POA: Diagnosis not present

## 2018-03-29 DIAGNOSIS — M4620 Osteomyelitis of vertebra, site unspecified: Secondary | ICD-10-CM | POA: Diagnosis not present

## 2018-03-31 DIAGNOSIS — M4620 Osteomyelitis of vertebra, site unspecified: Secondary | ICD-10-CM | POA: Diagnosis not present

## 2018-03-31 DIAGNOSIS — M6281 Muscle weakness (generalized): Secondary | ICD-10-CM | POA: Diagnosis not present

## 2018-03-31 DIAGNOSIS — R2689 Other abnormalities of gait and mobility: Secondary | ICD-10-CM | POA: Diagnosis not present

## 2018-04-05 DIAGNOSIS — R2689 Other abnormalities of gait and mobility: Secondary | ICD-10-CM | POA: Diagnosis not present

## 2018-04-05 DIAGNOSIS — M6281 Muscle weakness (generalized): Secondary | ICD-10-CM | POA: Diagnosis not present

## 2018-04-05 DIAGNOSIS — M4620 Osteomyelitis of vertebra, site unspecified: Secondary | ICD-10-CM | POA: Diagnosis not present

## 2018-04-14 DIAGNOSIS — I10 Essential (primary) hypertension: Secondary | ICD-10-CM | POA: Diagnosis not present

## 2018-04-14 DIAGNOSIS — M4620 Osteomyelitis of vertebra, site unspecified: Secondary | ICD-10-CM | POA: Diagnosis not present

## 2018-04-14 DIAGNOSIS — Z713 Dietary counseling and surveillance: Secondary | ICD-10-CM | POA: Diagnosis not present

## 2018-04-14 DIAGNOSIS — Z299 Encounter for prophylactic measures, unspecified: Secondary | ICD-10-CM | POA: Diagnosis not present

## 2018-04-14 DIAGNOSIS — Z6827 Body mass index (BMI) 27.0-27.9, adult: Secondary | ICD-10-CM | POA: Diagnosis not present

## 2018-04-14 DIAGNOSIS — E1165 Type 2 diabetes mellitus with hyperglycemia: Secondary | ICD-10-CM | POA: Diagnosis not present

## 2018-04-14 DIAGNOSIS — M6281 Muscle weakness (generalized): Secondary | ICD-10-CM | POA: Diagnosis not present

## 2018-04-14 DIAGNOSIS — R2689 Other abnormalities of gait and mobility: Secondary | ICD-10-CM | POA: Diagnosis not present

## 2018-04-20 ENCOUNTER — Other Ambulatory Visit (HOSPITAL_COMMUNITY)
Admission: RE | Admit: 2018-04-20 | Discharge: 2018-04-20 | Disposition: A | Discharge status: Home / Self Care | Payer: Medicare HMO | Source: Ambulatory Visit | Attending: Urology | Admitting: Urology

## 2018-04-20 ENCOUNTER — Ambulatory Visit: Payer: Medicare HMO | Admitting: Urology

## 2018-04-20 DIAGNOSIS — N200 Calculus of kidney: Secondary | ICD-10-CM

## 2018-04-27 LAB — STONE ANALYSIS
CA OXALATE, MONOHYDR.: 95 %
CA PHOS CRY STONE QL IR: 5 %
Stone Weight KSTONE: 76.9 mg

## 2018-05-17 DIAGNOSIS — M549 Dorsalgia, unspecified: Secondary | ICD-10-CM | POA: Diagnosis not present

## 2018-05-17 DIAGNOSIS — E1165 Type 2 diabetes mellitus with hyperglycemia: Secondary | ICD-10-CM | POA: Diagnosis not present

## 2018-05-17 DIAGNOSIS — I10 Essential (primary) hypertension: Secondary | ICD-10-CM | POA: Diagnosis not present

## 2018-05-17 DIAGNOSIS — Z87891 Personal history of nicotine dependence: Secondary | ICD-10-CM | POA: Diagnosis not present

## 2018-05-17 DIAGNOSIS — Z299 Encounter for prophylactic measures, unspecified: Secondary | ICD-10-CM | POA: Diagnosis not present

## 2018-05-17 DIAGNOSIS — Z23 Encounter for immunization: Secondary | ICD-10-CM | POA: Diagnosis not present

## 2018-05-17 DIAGNOSIS — Z6828 Body mass index (BMI) 28.0-28.9, adult: Secondary | ICD-10-CM | POA: Diagnosis not present

## 2018-05-23 DIAGNOSIS — M545 Low back pain: Secondary | ICD-10-CM | POA: Diagnosis not present

## 2018-05-23 DIAGNOSIS — B3742 Candidal balanitis: Secondary | ICD-10-CM | POA: Diagnosis not present

## 2018-05-23 DIAGNOSIS — Z299 Encounter for prophylactic measures, unspecified: Secondary | ICD-10-CM | POA: Diagnosis not present

## 2018-05-23 DIAGNOSIS — K219 Gastro-esophageal reflux disease without esophagitis: Secondary | ICD-10-CM | POA: Diagnosis not present

## 2018-05-23 DIAGNOSIS — Z6827 Body mass index (BMI) 27.0-27.9, adult: Secondary | ICD-10-CM | POA: Diagnosis not present

## 2018-05-23 DIAGNOSIS — I10 Essential (primary) hypertension: Secondary | ICD-10-CM | POA: Diagnosis not present

## 2018-05-31 DIAGNOSIS — H524 Presbyopia: Secondary | ICD-10-CM | POA: Diagnosis not present

## 2018-06-06 ENCOUNTER — Encounter (INDEPENDENT_AMBULATORY_CARE_PROVIDER_SITE_OTHER): Payer: Medicare HMO | Admitting: Ophthalmology

## 2018-06-06 DIAGNOSIS — I10 Essential (primary) hypertension: Secondary | ICD-10-CM

## 2018-06-06 DIAGNOSIS — H43813 Vitreous degeneration, bilateral: Secondary | ICD-10-CM

## 2018-06-06 DIAGNOSIS — H353122 Nonexudative age-related macular degeneration, left eye, intermediate dry stage: Secondary | ICD-10-CM

## 2018-06-06 DIAGNOSIS — E113393 Type 2 diabetes mellitus with moderate nonproliferative diabetic retinopathy without macular edema, bilateral: Secondary | ICD-10-CM

## 2018-06-06 DIAGNOSIS — H35033 Hypertensive retinopathy, bilateral: Secondary | ICD-10-CM

## 2018-06-06 DIAGNOSIS — E11319 Type 2 diabetes mellitus with unspecified diabetic retinopathy without macular edema: Secondary | ICD-10-CM | POA: Diagnosis not present

## 2018-06-10 DIAGNOSIS — E7800 Pure hypercholesterolemia, unspecified: Secondary | ICD-10-CM | POA: Diagnosis not present

## 2018-06-10 DIAGNOSIS — I10 Essential (primary) hypertension: Secondary | ICD-10-CM | POA: Diagnosis not present

## 2018-06-10 DIAGNOSIS — J029 Acute pharyngitis, unspecified: Secondary | ICD-10-CM | POA: Diagnosis not present

## 2018-06-10 DIAGNOSIS — Z6826 Body mass index (BMI) 26.0-26.9, adult: Secondary | ICD-10-CM | POA: Diagnosis not present

## 2018-06-10 DIAGNOSIS — E1165 Type 2 diabetes mellitus with hyperglycemia: Secondary | ICD-10-CM | POA: Diagnosis not present

## 2018-06-10 DIAGNOSIS — Z299 Encounter for prophylactic measures, unspecified: Secondary | ICD-10-CM | POA: Diagnosis not present

## 2018-07-04 DIAGNOSIS — K2900 Acute gastritis without bleeding: Secondary | ICD-10-CM | POA: Diagnosis not present

## 2018-07-04 DIAGNOSIS — Z6827 Body mass index (BMI) 27.0-27.9, adult: Secondary | ICD-10-CM | POA: Diagnosis not present

## 2018-07-04 DIAGNOSIS — J309 Allergic rhinitis, unspecified: Secondary | ICD-10-CM | POA: Diagnosis not present

## 2018-07-04 DIAGNOSIS — I10 Essential (primary) hypertension: Secondary | ICD-10-CM | POA: Diagnosis not present

## 2018-07-04 DIAGNOSIS — Z299 Encounter for prophylactic measures, unspecified: Secondary | ICD-10-CM | POA: Diagnosis not present

## 2018-07-21 DIAGNOSIS — Z6828 Body mass index (BMI) 28.0-28.9, adult: Secondary | ICD-10-CM | POA: Diagnosis not present

## 2018-07-21 DIAGNOSIS — I10 Essential (primary) hypertension: Secondary | ICD-10-CM | POA: Diagnosis not present

## 2018-07-21 DIAGNOSIS — Z299 Encounter for prophylactic measures, unspecified: Secondary | ICD-10-CM | POA: Diagnosis not present

## 2018-07-21 DIAGNOSIS — E7800 Pure hypercholesterolemia, unspecified: Secondary | ICD-10-CM | POA: Diagnosis not present

## 2018-07-21 DIAGNOSIS — E11319 Type 2 diabetes mellitus with unspecified diabetic retinopathy without macular edema: Secondary | ICD-10-CM | POA: Diagnosis not present

## 2018-07-21 DIAGNOSIS — E1165 Type 2 diabetes mellitus with hyperglycemia: Secondary | ICD-10-CM | POA: Diagnosis not present

## 2018-09-20 ENCOUNTER — Other Ambulatory Visit: Payer: Self-pay | Admitting: Urology

## 2018-09-20 ENCOUNTER — Other Ambulatory Visit (HOSPITAL_COMMUNITY): Payer: Self-pay | Admitting: Urology

## 2018-09-20 DIAGNOSIS — N200 Calculus of kidney: Secondary | ICD-10-CM

## 2018-10-12 ENCOUNTER — Ambulatory Visit (HOSPITAL_COMMUNITY)
Admission: RE | Admit: 2018-10-12 | Discharge: 2018-10-12 | Disposition: A | Discharge status: Home / Self Care | Payer: Medicare HMO | Source: Ambulatory Visit | Attending: Urology | Admitting: Urology

## 2018-10-12 DIAGNOSIS — I10 Essential (primary) hypertension: Secondary | ICD-10-CM | POA: Diagnosis not present

## 2018-10-12 DIAGNOSIS — N200 Calculus of kidney: Secondary | ICD-10-CM | POA: Diagnosis not present

## 2018-10-12 DIAGNOSIS — E7800 Pure hypercholesterolemia, unspecified: Secondary | ICD-10-CM | POA: Diagnosis not present

## 2018-10-12 DIAGNOSIS — E1165 Type 2 diabetes mellitus with hyperglycemia: Secondary | ICD-10-CM | POA: Diagnosis not present

## 2018-10-12 DIAGNOSIS — M549 Dorsalgia, unspecified: Secondary | ICD-10-CM | POA: Diagnosis not present

## 2018-10-12 DIAGNOSIS — Z299 Encounter for prophylactic measures, unspecified: Secondary | ICD-10-CM | POA: Diagnosis not present

## 2018-10-12 DIAGNOSIS — Z79899 Other long term (current) drug therapy: Secondary | ICD-10-CM | POA: Diagnosis not present

## 2018-10-12 DIAGNOSIS — Z6828 Body mass index (BMI) 28.0-28.9, adult: Secondary | ICD-10-CM | POA: Diagnosis not present

## 2018-10-19 ENCOUNTER — Ambulatory Visit: Payer: Medicare HMO | Admitting: Urology

## 2018-10-19 DIAGNOSIS — R3915 Urgency of urination: Secondary | ICD-10-CM

## 2018-10-19 DIAGNOSIS — N200 Calculus of kidney: Secondary | ICD-10-CM

## 2018-10-27 DIAGNOSIS — E11319 Type 2 diabetes mellitus with unspecified diabetic retinopathy without macular edema: Secondary | ICD-10-CM | POA: Diagnosis not present

## 2018-10-27 DIAGNOSIS — E1165 Type 2 diabetes mellitus with hyperglycemia: Secondary | ICD-10-CM | POA: Diagnosis not present

## 2018-10-27 DIAGNOSIS — I10 Essential (primary) hypertension: Secondary | ICD-10-CM | POA: Diagnosis not present

## 2018-10-27 DIAGNOSIS — Z299 Encounter for prophylactic measures, unspecified: Secondary | ICD-10-CM | POA: Diagnosis not present

## 2018-10-27 DIAGNOSIS — Z6828 Body mass index (BMI) 28.0-28.9, adult: Secondary | ICD-10-CM | POA: Diagnosis not present

## 2018-12-01 DIAGNOSIS — I10 Essential (primary) hypertension: Secondary | ICD-10-CM | POA: Diagnosis not present

## 2018-12-01 DIAGNOSIS — Z299 Encounter for prophylactic measures, unspecified: Secondary | ICD-10-CM | POA: Diagnosis not present

## 2018-12-01 DIAGNOSIS — Z713 Dietary counseling and surveillance: Secondary | ICD-10-CM | POA: Diagnosis not present

## 2018-12-01 DIAGNOSIS — E1165 Type 2 diabetes mellitus with hyperglycemia: Secondary | ICD-10-CM | POA: Diagnosis not present

## 2018-12-01 DIAGNOSIS — M4626 Osteomyelitis of vertebra, lumbar region: Secondary | ICD-10-CM | POA: Diagnosis not present

## 2018-12-07 ENCOUNTER — Encounter (INDEPENDENT_AMBULATORY_CARE_PROVIDER_SITE_OTHER): Payer: Medicare HMO | Admitting: Ophthalmology

## 2018-12-07 ENCOUNTER — Other Ambulatory Visit: Payer: Self-pay

## 2018-12-12 ENCOUNTER — Encounter (INDEPENDENT_AMBULATORY_CARE_PROVIDER_SITE_OTHER): Payer: Medicare HMO | Admitting: Ophthalmology

## 2018-12-13 ENCOUNTER — Other Ambulatory Visit: Payer: Self-pay

## 2018-12-13 ENCOUNTER — Encounter (INDEPENDENT_AMBULATORY_CARE_PROVIDER_SITE_OTHER): Payer: Medicare HMO | Admitting: Ophthalmology

## 2018-12-13 DIAGNOSIS — H35033 Hypertensive retinopathy, bilateral: Secondary | ICD-10-CM | POA: Diagnosis not present

## 2018-12-13 DIAGNOSIS — E11319 Type 2 diabetes mellitus with unspecified diabetic retinopathy without macular edema: Secondary | ICD-10-CM | POA: Diagnosis not present

## 2018-12-13 DIAGNOSIS — E113291 Type 2 diabetes mellitus with mild nonproliferative diabetic retinopathy without macular edema, right eye: Secondary | ICD-10-CM

## 2018-12-13 DIAGNOSIS — I10 Essential (primary) hypertension: Secondary | ICD-10-CM | POA: Diagnosis not present

## 2018-12-13 DIAGNOSIS — E113392 Type 2 diabetes mellitus with moderate nonproliferative diabetic retinopathy without macular edema, left eye: Secondary | ICD-10-CM

## 2018-12-13 DIAGNOSIS — H43813 Vitreous degeneration, bilateral: Secondary | ICD-10-CM | POA: Diagnosis not present

## 2018-12-13 DIAGNOSIS — H353122 Nonexudative age-related macular degeneration, left eye, intermediate dry stage: Secondary | ICD-10-CM

## 2019-01-10 DIAGNOSIS — I10 Essential (primary) hypertension: Secondary | ICD-10-CM | POA: Diagnosis not present

## 2019-01-10 DIAGNOSIS — E1165 Type 2 diabetes mellitus with hyperglycemia: Secondary | ICD-10-CM | POA: Diagnosis not present

## 2019-01-10 DIAGNOSIS — M4626 Osteomyelitis of vertebra, lumbar region: Secondary | ICD-10-CM | POA: Diagnosis not present

## 2019-01-10 DIAGNOSIS — M549 Dorsalgia, unspecified: Secondary | ICD-10-CM | POA: Diagnosis not present

## 2019-01-10 DIAGNOSIS — Z299 Encounter for prophylactic measures, unspecified: Secondary | ICD-10-CM | POA: Diagnosis not present

## 2019-01-10 DIAGNOSIS — Z79899 Other long term (current) drug therapy: Secondary | ICD-10-CM | POA: Diagnosis not present

## 2019-01-16 DIAGNOSIS — Z299 Encounter for prophylactic measures, unspecified: Secondary | ICD-10-CM | POA: Diagnosis not present

## 2019-01-16 DIAGNOSIS — Z125 Encounter for screening for malignant neoplasm of prostate: Secondary | ICD-10-CM | POA: Diagnosis not present

## 2019-01-16 DIAGNOSIS — Z79899 Other long term (current) drug therapy: Secondary | ICD-10-CM | POA: Diagnosis not present

## 2019-01-16 DIAGNOSIS — Z1339 Encounter for screening examination for other mental health and behavioral disorders: Secondary | ICD-10-CM | POA: Diagnosis not present

## 2019-01-16 DIAGNOSIS — E7800 Pure hypercholesterolemia, unspecified: Secondary | ICD-10-CM | POA: Diagnosis not present

## 2019-01-16 DIAGNOSIS — I10 Essential (primary) hypertension: Secondary | ICD-10-CM | POA: Diagnosis not present

## 2019-01-16 DIAGNOSIS — Z1331 Encounter for screening for depression: Secondary | ICD-10-CM | POA: Diagnosis not present

## 2019-01-16 DIAGNOSIS — Z1211 Encounter for screening for malignant neoplasm of colon: Secondary | ICD-10-CM | POA: Diagnosis not present

## 2019-01-16 DIAGNOSIS — R5383 Other fatigue: Secondary | ICD-10-CM | POA: Diagnosis not present

## 2019-01-16 DIAGNOSIS — Z0000 Encounter for general adult medical examination without abnormal findings: Secondary | ICD-10-CM | POA: Diagnosis not present

## 2019-01-16 DIAGNOSIS — Z7189 Other specified counseling: Secondary | ICD-10-CM | POA: Diagnosis not present

## 2019-01-16 DIAGNOSIS — Z6828 Body mass index (BMI) 28.0-28.9, adult: Secondary | ICD-10-CM | POA: Diagnosis not present

## 2019-02-15 DIAGNOSIS — Z6828 Body mass index (BMI) 28.0-28.9, adult: Secondary | ICD-10-CM | POA: Diagnosis not present

## 2019-02-15 DIAGNOSIS — E7800 Pure hypercholesterolemia, unspecified: Secondary | ICD-10-CM | POA: Diagnosis not present

## 2019-02-15 DIAGNOSIS — Z299 Encounter for prophylactic measures, unspecified: Secondary | ICD-10-CM | POA: Diagnosis not present

## 2019-02-15 DIAGNOSIS — M549 Dorsalgia, unspecified: Secondary | ICD-10-CM | POA: Diagnosis not present

## 2019-02-15 DIAGNOSIS — I10 Essential (primary) hypertension: Secondary | ICD-10-CM | POA: Diagnosis not present

## 2019-02-15 DIAGNOSIS — E1165 Type 2 diabetes mellitus with hyperglycemia: Secondary | ICD-10-CM | POA: Diagnosis not present

## 2019-03-17 DIAGNOSIS — E1165 Type 2 diabetes mellitus with hyperglycemia: Secondary | ICD-10-CM | POA: Diagnosis not present

## 2019-03-17 DIAGNOSIS — Z299 Encounter for prophylactic measures, unspecified: Secondary | ICD-10-CM | POA: Diagnosis not present

## 2019-03-17 DIAGNOSIS — E7800 Pure hypercholesterolemia, unspecified: Secondary | ICD-10-CM | POA: Diagnosis not present

## 2019-03-17 DIAGNOSIS — I10 Essential (primary) hypertension: Secondary | ICD-10-CM | POA: Diagnosis not present

## 2019-03-17 DIAGNOSIS — Z6828 Body mass index (BMI) 28.0-28.9, adult: Secondary | ICD-10-CM | POA: Diagnosis not present

## 2019-04-18 DIAGNOSIS — K219 Gastro-esophageal reflux disease without esophagitis: Secondary | ICD-10-CM | POA: Diagnosis not present

## 2019-04-18 DIAGNOSIS — I10 Essential (primary) hypertension: Secondary | ICD-10-CM | POA: Diagnosis not present

## 2019-04-18 DIAGNOSIS — M549 Dorsalgia, unspecified: Secondary | ICD-10-CM | POA: Diagnosis not present

## 2019-04-18 DIAGNOSIS — R110 Nausea: Secondary | ICD-10-CM | POA: Diagnosis not present

## 2019-04-18 DIAGNOSIS — Z6828 Body mass index (BMI) 28.0-28.9, adult: Secondary | ICD-10-CM | POA: Diagnosis not present

## 2019-04-18 DIAGNOSIS — Z299 Encounter for prophylactic measures, unspecified: Secondary | ICD-10-CM | POA: Diagnosis not present

## 2019-04-19 DIAGNOSIS — Z299 Encounter for prophylactic measures, unspecified: Secondary | ICD-10-CM | POA: Diagnosis not present

## 2019-04-19 DIAGNOSIS — E1140 Type 2 diabetes mellitus with diabetic neuropathy, unspecified: Secondary | ICD-10-CM | POA: Diagnosis not present

## 2019-04-19 DIAGNOSIS — M21969 Unspecified acquired deformity of unspecified lower leg: Secondary | ICD-10-CM | POA: Diagnosis not present

## 2019-04-19 DIAGNOSIS — E7800 Pure hypercholesterolemia, unspecified: Secondary | ICD-10-CM | POA: Diagnosis not present

## 2019-04-19 DIAGNOSIS — E1165 Type 2 diabetes mellitus with hyperglycemia: Secondary | ICD-10-CM | POA: Diagnosis not present

## 2019-04-19 DIAGNOSIS — I10 Essential (primary) hypertension: Secondary | ICD-10-CM | POA: Diagnosis not present

## 2019-04-19 DIAGNOSIS — Z6828 Body mass index (BMI) 28.0-28.9, adult: Secondary | ICD-10-CM | POA: Diagnosis not present

## 2019-04-28 DIAGNOSIS — D1801 Hemangioma of skin and subcutaneous tissue: Secondary | ICD-10-CM | POA: Diagnosis not present

## 2019-04-28 DIAGNOSIS — L570 Actinic keratosis: Secondary | ICD-10-CM | POA: Diagnosis not present

## 2019-04-28 DIAGNOSIS — L821 Other seborrheic keratosis: Secondary | ICD-10-CM | POA: Diagnosis not present

## 2019-04-28 DIAGNOSIS — L720 Epidermal cyst: Secondary | ICD-10-CM | POA: Diagnosis not present

## 2019-04-28 DIAGNOSIS — D692 Other nonthrombocytopenic purpura: Secondary | ICD-10-CM | POA: Diagnosis not present

## 2019-05-15 DIAGNOSIS — M7989 Other specified soft tissue disorders: Secondary | ICD-10-CM | POA: Diagnosis not present

## 2019-05-15 DIAGNOSIS — E7800 Pure hypercholesterolemia, unspecified: Secondary | ICD-10-CM | POA: Diagnosis not present

## 2019-05-15 DIAGNOSIS — Z299 Encounter for prophylactic measures, unspecified: Secondary | ICD-10-CM | POA: Diagnosis not present

## 2019-05-15 DIAGNOSIS — Z6828 Body mass index (BMI) 28.0-28.9, adult: Secondary | ICD-10-CM | POA: Diagnosis not present

## 2019-05-15 DIAGNOSIS — I10 Essential (primary) hypertension: Secondary | ICD-10-CM | POA: Diagnosis not present

## 2019-05-19 LAB — HEMOGLOBIN A1C: Hemoglobin A1C: 7.2

## 2019-05-22 DIAGNOSIS — E1165 Type 2 diabetes mellitus with hyperglycemia: Secondary | ICD-10-CM | POA: Diagnosis not present

## 2019-05-22 DIAGNOSIS — Z299 Encounter for prophylactic measures, unspecified: Secondary | ICD-10-CM | POA: Diagnosis not present

## 2019-05-22 DIAGNOSIS — Z6828 Body mass index (BMI) 28.0-28.9, adult: Secondary | ICD-10-CM | POA: Diagnosis not present

## 2019-05-22 DIAGNOSIS — H6123 Impacted cerumen, bilateral: Secondary | ICD-10-CM | POA: Diagnosis not present

## 2019-05-22 DIAGNOSIS — H6120 Impacted cerumen, unspecified ear: Secondary | ICD-10-CM | POA: Diagnosis not present

## 2019-05-22 DIAGNOSIS — I10 Essential (primary) hypertension: Secondary | ICD-10-CM | POA: Diagnosis not present

## 2019-05-22 DIAGNOSIS — B372 Candidiasis of skin and nail: Secondary | ICD-10-CM | POA: Diagnosis not present

## 2019-05-24 ENCOUNTER — Encounter: Payer: Self-pay | Admitting: Family Medicine

## 2019-05-24 ENCOUNTER — Other Ambulatory Visit: Payer: Self-pay

## 2019-05-24 ENCOUNTER — Ambulatory Visit (INDEPENDENT_AMBULATORY_CARE_PROVIDER_SITE_OTHER): Payer: Medicare HMO | Admitting: Family Medicine

## 2019-05-24 VITALS — BP 158/89 | HR 74 | Temp 97.9°F | Ht 68.0 in | Wt 177.4 lb

## 2019-05-24 DIAGNOSIS — G8929 Other chronic pain: Secondary | ICD-10-CM

## 2019-05-24 DIAGNOSIS — E1169 Type 2 diabetes mellitus with other specified complication: Secondary | ICD-10-CM | POA: Diagnosis not present

## 2019-05-24 DIAGNOSIS — M545 Low back pain, unspecified: Secondary | ICD-10-CM

## 2019-05-24 DIAGNOSIS — K219 Gastro-esophageal reflux disease without esophagitis: Secondary | ICD-10-CM | POA: Insufficient documentation

## 2019-05-24 DIAGNOSIS — I10 Essential (primary) hypertension: Secondary | ICD-10-CM

## 2019-05-24 MED ORDER — LOSARTAN POTASSIUM 50 MG PO TABS: ORAL_TABLET | ORAL | 0 refills | Status: DC

## 2019-05-24 NOTE — Patient Instructions (Addendum)
Diabetic education-pt using Novolog prn  Pt taking Tramadol-d/w pt needs pain management for continued medication or referral to pain management. Pt will decide for future follow up

## 2019-05-24 NOTE — Progress Notes (Signed)
New Patient Office Visit  Subjective:  Patient ID: Thomas Adkins, male    DOB: May 11, 1941  Age: 78 y.o. MRN: [id number]  CC:  Chief Complaint  Patient presents with  . Establish Care  . Leg Pain    numbness  . Hand Pain    numbness  . Diabetes   04/19/2019  1   04/19/2019  Pregabalin 50 MG Capsule  60.00  30 Dh Vya   [id number]   Ede (0252)   0  0.67 LME  Medicare   [place]  04/18/2019  1   04/18/2019  Tramadol Hcl 50 MG Tablet  120.00  30 Ma Bar   [id number]   Ede (0252)   0  20.00 MME  Medicare   [place]  02/15/2019  1   02/15/2019  Tramadol Hcl 50 MG Tablet  120.00  30 Ma Bar   [id number]   Ede (0252)   0  20.00 MME  Medicare   [place]  01/10/2019  1   01/10/2019  Tramadol Hcl 50 MG Tablet  120.00  30 Ma Bar   [id number]   Ede ([id number])   0  20.00 MME  Medicare   [place]  12/01/2018  1   12/01/2018  Tramadol Hcl 50 MG Tablet  120.00  30 Dh Vya   [id number]   Ede (0252)   0  20.00 MME  Medicare   [place]  10/12/2018  1   10/12/2018  Tramadol Hcl 50 MG Tablet  120.00  30 [place]   [id number]   Ede ([id number])   0  20.00 MME  Medicare   [place]  09/07/2018  1   05/17/2018  Tramadol Hcl 50 MG Tablet  120.00  30 Ma Bar   [id number]   Ede ([id number])   2  20.00 MME  Medicare   [place]  06/22/2018  1   05/17/2018  Tramadol Hcl 50 MG Tablet  120.00  30 Ma Bar   [id number]   Ede ([id number])   1  20.00 MME  Medicare   [place]  05/17/2018  1   05/17/2018  Tramadol Hcl 50 MG Tablet  120.00  30 Ma Bar   [id number]   Ede ([id number])   0  20.00 MME  Medicare   [place]  03/29/2018  1   01/06/2018  Tramadol Hcl 50 MG Tablet  120.00  30 Dh Vya   [id number]   Ede (0252)   2  20.00 MME  Medicare   [place]  02/16/2018  1   01/06/2018  Tramadol Hcl 50 MG Tablet  120.00  30 Dh Vya   [id number]   Ede ([id number])   1  20.00 MME  Medicare   [place]  01/06/2018  1   01/06/2018  Tramadol Hcl 50 MG Tablet  120.00  30 Dh Vya   [id number]   Ede ([id number])   0  20.00 MME  Medicare   [place]  12/17/2017  1   11/29/2017  Tramadol Hcl 50 MG Tablet  15.00  3 [address] [place]   [id number]   Ede ([id number])   0  25.00 MME  Medicare   [place]  11/19/2017  1   09/28/2017   Tramadol Hcl 50 MG Tablet  120.00  30 Dh Vya   [id number]   Ede ([id number])   1  20.00 MME  Medicare   [place]  10/05/2017  1   09/28/2017  Tramadol Hcl 50 MG Tablet  120.00  30 Dh Vya   [id number]   Ede (  0252)   0  20.00 MME  Medicare   [place]  09/17/2017  1   07/27/2017  Tramadol Hcl 50 MG Tablet  90.00  30 Dh Vya   [id number]   Ede ([id number])   2  15.00 MME  Medicare   [place]  08/30/2017  1   08/30/2017  Oxycodone Hcl 5 MG Tablet  21.00  7 Dh Vya   [id number]   Ede ([id number])   0  22.50 MME  Medicare   [place]  08/23/2017  1   07/27/2017  Tramadol Hcl 50 MG Tablet  90.00  30 Dh Vya   [id number]   Ede ([id number])   1  15.00 MME  Medicare   [place]  07/27/2017  1   07/27/2017  Tramadol Hcl 50 MG Tablet  90.00  30 Dh Vya   [id number]   Ede ([id number])   0  15.00 MME  Medicare   [place]  06/29/2017  1   06/16/2017  Tramadol Hcl 50 MG Tablet  60.00  15 Dh Vya   [id number]   Ede ([id number])   1  20.00 MME  Medicare   [place]  06/16/2017  1   06/16/2017  Tramadol Hcl 50 MG Tablet  60.00  15 Dh Vya   [id number]   Ede (0252)   0  20.00 MME  Medicare     06/09/2017  1   06/09/2017  Tramadol Hcl 50 MG Tablet  20.00  5 Dh Vya   [id number]   Ede ([id number])   0  20.00 MME  Medicare     06/05/2017  1   06/05/2017  Hydrocodone-Acetamin 5-325 MG  10.00  5 Ka Mcm   [id number]   Ede ([id number])   0  10.00 MME  Medicare       HPI Thomas Adkins presents for DM-takes actos/glucotrol/Jardiance-insulin in the past HTN-metoprolol-lopressor BID/losartan-recently  Changed recently to 50mg  from 100mg  bp check at home-elevated at home systolic [id number] -2 years ago-normal  Hyperlipidemia-simvastatin-no lipid panel on record Diabetes-7.2% A1c -pt with yeast infection-pt with difficulty paying for medication-Jardiance and would like to stop. about medication changes. Insulin based on sliding scale-pt states he takes several times a week. Pt states he has numbness in the feet  Back pain-pt takes tramadol-at least 2/day +Aleve-pt states stomach up. Pt with no evaluation for back pain in the past. No epidurals  Past  Medical History:  Diagnosis Date  . Anxiety   . Arthritis   . Back ache    back problems  . Cataract   . Diabetes ([place]) 06/23/2016  . GERD (gastroesophageal reflux disease)   . High blood pressure 06/23/2016  . High cholesterol 06/23/2016  . History of kidney stones     Past Surgical History:  Procedure Laterality Date  . CATARACT EXTRACTION     bilateral  . EXTRACORPOREAL SHOCK WAVE LITHOTRIPSY Left 11/29/2017   Procedure: LEFT EXTRACORPOREAL SHOCK WAVE LITHOTRIPSY (ESWL);  Surgeon: Franchot Gallo, MD;  Location: [facility];  Service: Urology;  Laterality: Left;  . EYE SURGERY    . IR LUMBAR DISC ASPIRATION W/IMG GUIDE  09/09/2017  . JOINT REPLACEMENT     toe 40 yrs ago    Family History  Problem Relation Age of Onset  . Cancer Mother     Social History   Socioeconomic History  . Marital status: Married    Spouse name: Not on file  . Number of children: Not on file  . Years of education: Not on file  .  Highest education level: Not on file  Occupational History  . Occupation: retired  Scientific laboratory technician  . Financial resource strain: Not on file  . Food insecurity    Worry: Not on file    Inability: Not on file  . Transportation needs    Medical: Not on file    Non-medical: Not on file  Tobacco Use  . Smoking status: Never Smoker  . Smokeless tobacco: Never Used  Substance and Sexual Activity  . Alcohol use: No  . Drug use: No  . Sexual activity: Not Currently  Lifestyle  . Physical activity    Days per week: Not on file    Minutes per session: Not on file  . Stress: Not on file  Relationships  . Social Herbalist on phone: Not on file    Gets together: Not on file    Attends religious service: Not on file    Active member of club or organization: Not on file    Attends meetings of clubs or organizations: Not on file    Relationship status: Not on file  . Intimate partner violence    Fear of current or ex partner: Not on file    Emotionally  abused: Not on file    Physically abused: Not on file    Forced sexual activity: Not on file  Other Topics Concern  . Not on file  Social History Narrative  . Not on file    ROS Review of Systems  Constitutional: Positive for fatigue.  HENT: Positive for hearing loss, rhinorrhea, sinus pressure and voice change.   Eyes: Positive for visual disturbance.  Cardiovascular: Positive for leg swelling.  Gastrointestinal: Positive for constipation and nausea.  Musculoskeletal: Positive for arthralgias, back pain, joint swelling, myalgias and neck stiffness.  Skin: Positive for rash.  Neurological: Positive for dizziness, weakness, light-headedness, numbness and headaches.  Psychiatric/Behavioral: The patient is nervous/anxious.     Objective:   Today's Vitals: BP (!) 158/89 (BP Location: Right Arm, Patient Position: Sitting, Cuff Size: Normal)   Pulse 74   Temp 97.9 F (36.6 C) (Oral)   Ht 5\' 8"  (1.727 m)   Wt 177 lb 6.4 oz (80.5 kg)   SpO2 96%   BMI 26.97 kg/m   Physical Exam Constitutional:      Appearance: Normal appearance.  HENT:     Head: Normocephalic and atraumatic.  Cardiovascular:     Rate and Rhythm: Normal rate and regular rhythm.     Pulses: Normal pulses.     Heart sounds: Normal heart sounds.  Neurological:     Mental Status: He is alert.     Assessment & Plan:    Outpatient Encounter Medications as of 05/24/2019  Medication Sig  . aspirin 81 MG tablet Take 81 mg by mouth daily.   . diclofenac sodium (VOLTAREN) 1 % GEL Apply 1 application topically 3 (three) times daily as needed (pain).  Marland Kitchen gabapentin (NEURONTIN) 300 MG capsule Take 300 mg by mouth at bedtime.  Marland Kitchen glipiZIDE (GLUCOTROL) 10 MG tablet Take 10 mg by mouth 2 (two) times daily before a meal.  . levofloxacin (LEVAQUIN) 750 MG tablet Take 1 tablet (750 mg total) by mouth daily. (Patient not taking: Reported on 12/20/2017)  . losartan (COZAAR) 100 MG tablet Take 100 mg by mouth daily.  .  metFORMIN (GLUCOPHAGE-XR) 500 MG 24 hr tablet Take 500-1,000 mg by mouth See admin instructions. Pt takes med every other day. 1000 mg in the morning,  and 500 mg in the evening  . metoprolol (LOPRESSOR) 50 MG tablet Take 50 mg by mouth 2 (two) times daily.   . naproxen sodium (ALEVE) 220 MG tablet Take 440 mg by mouth 2 (two) times daily as needed.  Marland Kitchen omeprazole (PRILOSEC) 20 MG capsule Take 20 mg by mouth daily.  . ondansetron (ZOFRAN) 4 MG tablet Take 4 mg by mouth every 8 (eight) hours as needed for nausea or vomiting.  . pioglitazone (ACTOS) 15 MG tablet Take 15 mg by mouth daily.  . simvastatin (ZOCOR) 20 MG tablet Take 20 mg by mouth daily.  . traMADol (ULTRAM) 50 MG tablet Take 50 mg by mouth every 6 (six) hours as needed.   No facility-administered encounter medications on file as of 05/24/2019.   1. Type 2 diabetes mellitus with other specified complication, without long-term current use of insulin (HCC) Metformin stopped last year due to stomach upset jardiance stopped by pt due to cost-pt states he can not afford medication Novalog -pt to take glucose and uses sliding scale NO Diabetic education in the past. Pt has not used basal insulin - Amb Referral to Nutrition and Diabetic E  2. Essential hypertension Lopressor/Lotensin-change to 50mg  BID to avoid high/low readings in BP -pt states recently lowered to 50mg  from 100mg  with rebound elevation No CP/No headaches, ecg 2 years ago normal-no cardiac w/u in the past  3. Chronic bilateral low back pain, unspecified whether sciatica present Taking tramadol-pt understands that will not be prescribed here voltaren otc Avoid Aleve due to stomach upset 4. Gastroesophageal reflux disease without esophagitis prilosec Follow-up: pt understands we can not write for tramadol-no follow up scheduled. Recommended pt stay with current physician as he writes for pts pain medication. Suggested diabetic education f/u Greater than 50% of time used  to discuss blood pressure elevation-medication-side effects, DM-medications, side effects, need for diabetic education, use of insulin for treatment of diabetes, risk of medications, concern for abdominal pain with aleve LISA Hannah Beat, MD

## 2019-05-26 DIAGNOSIS — E1165 Type 2 diabetes mellitus with hyperglycemia: Secondary | ICD-10-CM | POA: Diagnosis not present

## 2019-05-26 DIAGNOSIS — Z6828 Body mass index (BMI) 28.0-28.9, adult: Secondary | ICD-10-CM | POA: Diagnosis not present

## 2019-05-26 DIAGNOSIS — I10 Essential (primary) hypertension: Secondary | ICD-10-CM | POA: Diagnosis not present

## 2019-05-26 DIAGNOSIS — E7800 Pure hypercholesterolemia, unspecified: Secondary | ICD-10-CM | POA: Diagnosis not present

## 2019-05-26 DIAGNOSIS — Z299 Encounter for prophylactic measures, unspecified: Secondary | ICD-10-CM | POA: Diagnosis not present

## 2019-06-01 DIAGNOSIS — I10 Essential (primary) hypertension: Secondary | ICD-10-CM | POA: Diagnosis not present

## 2019-06-15 ENCOUNTER — Encounter (INDEPENDENT_AMBULATORY_CARE_PROVIDER_SITE_OTHER): Payer: Medicare HMO | Admitting: Ophthalmology

## 2019-06-22 ENCOUNTER — Encounter (INDEPENDENT_AMBULATORY_CARE_PROVIDER_SITE_OTHER): Payer: Medicare HMO | Admitting: Ophthalmology

## 2019-06-22 DIAGNOSIS — E11319 Type 2 diabetes mellitus with unspecified diabetic retinopathy without macular edema: Secondary | ICD-10-CM

## 2019-06-22 DIAGNOSIS — H43813 Vitreous degeneration, bilateral: Secondary | ICD-10-CM

## 2019-06-22 DIAGNOSIS — H35033 Hypertensive retinopathy, bilateral: Secondary | ICD-10-CM | POA: Diagnosis not present

## 2019-06-22 DIAGNOSIS — I10 Essential (primary) hypertension: Secondary | ICD-10-CM

## 2019-06-22 DIAGNOSIS — E113293 Type 2 diabetes mellitus with mild nonproliferative diabetic retinopathy without macular edema, bilateral: Secondary | ICD-10-CM

## 2019-06-26 DIAGNOSIS — E7800 Pure hypercholesterolemia, unspecified: Secondary | ICD-10-CM | POA: Diagnosis not present

## 2019-06-26 DIAGNOSIS — Z6829 Body mass index (BMI) 29.0-29.9, adult: Secondary | ICD-10-CM | POA: Diagnosis not present

## 2019-06-26 DIAGNOSIS — I10 Essential (primary) hypertension: Secondary | ICD-10-CM | POA: Diagnosis not present

## 2019-06-26 DIAGNOSIS — E1165 Type 2 diabetes mellitus with hyperglycemia: Secondary | ICD-10-CM | POA: Diagnosis not present

## 2019-06-26 DIAGNOSIS — Z299 Encounter for prophylactic measures, unspecified: Secondary | ICD-10-CM | POA: Diagnosis not present

## 2019-06-29 DIAGNOSIS — I10 Essential (primary) hypertension: Secondary | ICD-10-CM | POA: Diagnosis not present

## 2019-06-29 DIAGNOSIS — E119 Type 2 diabetes mellitus without complications: Secondary | ICD-10-CM | POA: Diagnosis not present

## 2019-07-04 ENCOUNTER — Ambulatory Visit: Payer: Medicare HMO | Admitting: Nutrition

## 2019-07-04 DIAGNOSIS — M79604 Pain in right leg: Secondary | ICD-10-CM | POA: Diagnosis not present

## 2019-07-04 DIAGNOSIS — E1159 Type 2 diabetes mellitus with other circulatory complications: Secondary | ICD-10-CM | POA: Diagnosis not present

## 2019-07-04 DIAGNOSIS — M79605 Pain in left leg: Secondary | ICD-10-CM | POA: Diagnosis not present

## 2019-07-04 DIAGNOSIS — E1140 Type 2 diabetes mellitus with diabetic neuropathy, unspecified: Secondary | ICD-10-CM | POA: Diagnosis not present

## 2019-07-13 DIAGNOSIS — E119 Type 2 diabetes mellitus without complications: Secondary | ICD-10-CM | POA: Diagnosis not present

## 2019-07-25 DIAGNOSIS — I739 Peripheral vascular disease, unspecified: Secondary | ICD-10-CM | POA: Diagnosis not present

## 2019-07-25 DIAGNOSIS — E1165 Type 2 diabetes mellitus with hyperglycemia: Secondary | ICD-10-CM | POA: Diagnosis not present

## 2019-07-25 DIAGNOSIS — M549 Dorsalgia, unspecified: Secondary | ICD-10-CM | POA: Diagnosis not present

## 2019-07-25 DIAGNOSIS — Z299 Encounter for prophylactic measures, unspecified: Secondary | ICD-10-CM | POA: Diagnosis not present

## 2019-07-25 DIAGNOSIS — Z3A10 10 weeks gestation of pregnancy: Secondary | ICD-10-CM | POA: Diagnosis not present

## 2019-07-25 DIAGNOSIS — E1151 Type 2 diabetes mellitus with diabetic peripheral angiopathy without gangrene: Secondary | ICD-10-CM | POA: Diagnosis not present

## 2019-07-25 DIAGNOSIS — I10 Essential (primary) hypertension: Secondary | ICD-10-CM | POA: Diagnosis not present

## 2019-07-25 DIAGNOSIS — Z6829 Body mass index (BMI) 29.0-29.9, adult: Secondary | ICD-10-CM | POA: Diagnosis not present

## 2019-08-08 DIAGNOSIS — I10 Essential (primary) hypertension: Secondary | ICD-10-CM | POA: Diagnosis not present

## 2019-08-08 DIAGNOSIS — E119 Type 2 diabetes mellitus without complications: Secondary | ICD-10-CM | POA: Diagnosis not present

## 2019-08-09 DIAGNOSIS — I10 Essential (primary) hypertension: Secondary | ICD-10-CM | POA: Diagnosis not present

## 2019-08-09 DIAGNOSIS — Z299 Encounter for prophylactic measures, unspecified: Secondary | ICD-10-CM | POA: Diagnosis not present

## 2019-08-09 DIAGNOSIS — E1165 Type 2 diabetes mellitus with hyperglycemia: Secondary | ICD-10-CM | POA: Diagnosis not present

## 2019-08-09 DIAGNOSIS — E11319 Type 2 diabetes mellitus with unspecified diabetic retinopathy without macular edema: Secondary | ICD-10-CM | POA: Diagnosis not present

## 2019-08-09 DIAGNOSIS — K279 Peptic ulcer, site unspecified, unspecified as acute or chronic, without hemorrhage or perforation: Secondary | ICD-10-CM | POA: Diagnosis not present

## 2019-08-14 DIAGNOSIS — H524 Presbyopia: Secondary | ICD-10-CM | POA: Diagnosis not present

## 2019-08-21 DIAGNOSIS — I739 Peripheral vascular disease, unspecified: Secondary | ICD-10-CM | POA: Diagnosis not present

## 2019-08-24 DIAGNOSIS — Z6828 Body mass index (BMI) 28.0-28.9, adult: Secondary | ICD-10-CM | POA: Diagnosis not present

## 2019-08-24 DIAGNOSIS — E1165 Type 2 diabetes mellitus with hyperglycemia: Secondary | ICD-10-CM | POA: Diagnosis not present

## 2019-08-24 DIAGNOSIS — I10 Essential (primary) hypertension: Secondary | ICD-10-CM | POA: Diagnosis not present

## 2019-08-24 DIAGNOSIS — I739 Peripheral vascular disease, unspecified: Secondary | ICD-10-CM | POA: Diagnosis not present

## 2019-08-24 DIAGNOSIS — Z299 Encounter for prophylactic measures, unspecified: Secondary | ICD-10-CM | POA: Diagnosis not present

## 2019-08-28 DIAGNOSIS — M1712 Unilateral primary osteoarthritis, left knee: Secondary | ICD-10-CM | POA: Diagnosis not present

## 2019-08-28 DIAGNOSIS — I10 Essential (primary) hypertension: Secondary | ICD-10-CM | POA: Diagnosis not present

## 2019-08-28 DIAGNOSIS — Z299 Encounter for prophylactic measures, unspecified: Secondary | ICD-10-CM | POA: Diagnosis not present

## 2019-08-28 DIAGNOSIS — M79606 Pain in leg, unspecified: Secondary | ICD-10-CM | POA: Diagnosis not present

## 2019-08-28 DIAGNOSIS — Z6828 Body mass index (BMI) 28.0-28.9, adult: Secondary | ICD-10-CM | POA: Diagnosis not present

## 2019-08-28 DIAGNOSIS — M25562 Pain in left knee: Secondary | ICD-10-CM | POA: Diagnosis not present

## 2019-09-08 DIAGNOSIS — E1165 Type 2 diabetes mellitus with hyperglycemia: Secondary | ICD-10-CM | POA: Diagnosis not present

## 2019-09-08 DIAGNOSIS — M25561 Pain in right knee: Secondary | ICD-10-CM | POA: Diagnosis not present

## 2019-09-08 DIAGNOSIS — M25569 Pain in unspecified knee: Secondary | ICD-10-CM | POA: Diagnosis not present

## 2019-09-08 DIAGNOSIS — I10 Essential (primary) hypertension: Secondary | ICD-10-CM | POA: Diagnosis not present

## 2019-09-08 DIAGNOSIS — M25562 Pain in left knee: Secondary | ICD-10-CM | POA: Diagnosis not present

## 2019-09-08 DIAGNOSIS — Z6828 Body mass index (BMI) 28.0-28.9, adult: Secondary | ICD-10-CM | POA: Diagnosis not present

## 2019-09-08 DIAGNOSIS — Z87891 Personal history of nicotine dependence: Secondary | ICD-10-CM | POA: Diagnosis not present

## 2019-09-08 DIAGNOSIS — Z299 Encounter for prophylactic measures, unspecified: Secondary | ICD-10-CM | POA: Diagnosis not present

## 2019-09-21 DIAGNOSIS — Z23 Encounter for immunization: Secondary | ICD-10-CM | POA: Diagnosis not present

## 2019-09-25 DIAGNOSIS — Z299 Encounter for prophylactic measures, unspecified: Secondary | ICD-10-CM | POA: Diagnosis not present

## 2019-09-25 DIAGNOSIS — I739 Peripheral vascular disease, unspecified: Secondary | ICD-10-CM | POA: Diagnosis not present

## 2019-09-25 DIAGNOSIS — R0989 Other specified symptoms and signs involving the circulatory and respiratory systems: Secondary | ICD-10-CM | POA: Diagnosis not present

## 2019-09-25 DIAGNOSIS — Z6828 Body mass index (BMI) 28.0-28.9, adult: Secondary | ICD-10-CM | POA: Diagnosis not present

## 2019-09-25 DIAGNOSIS — E1165 Type 2 diabetes mellitus with hyperglycemia: Secondary | ICD-10-CM | POA: Diagnosis not present

## 2019-09-25 DIAGNOSIS — I10 Essential (primary) hypertension: Secondary | ICD-10-CM | POA: Diagnosis not present

## 2019-10-07 IMAGING — CR DG ABDOMEN 1V
1 series · 1 of 1 positions shown · non-contrast
Comparison: KUB 11/17/2017.

CLINICAL DATA: Preoperative examination for patient with a left
renal stone.

EXAM:
ABDOMEN - 1 VIEW

[t abdomen supine]
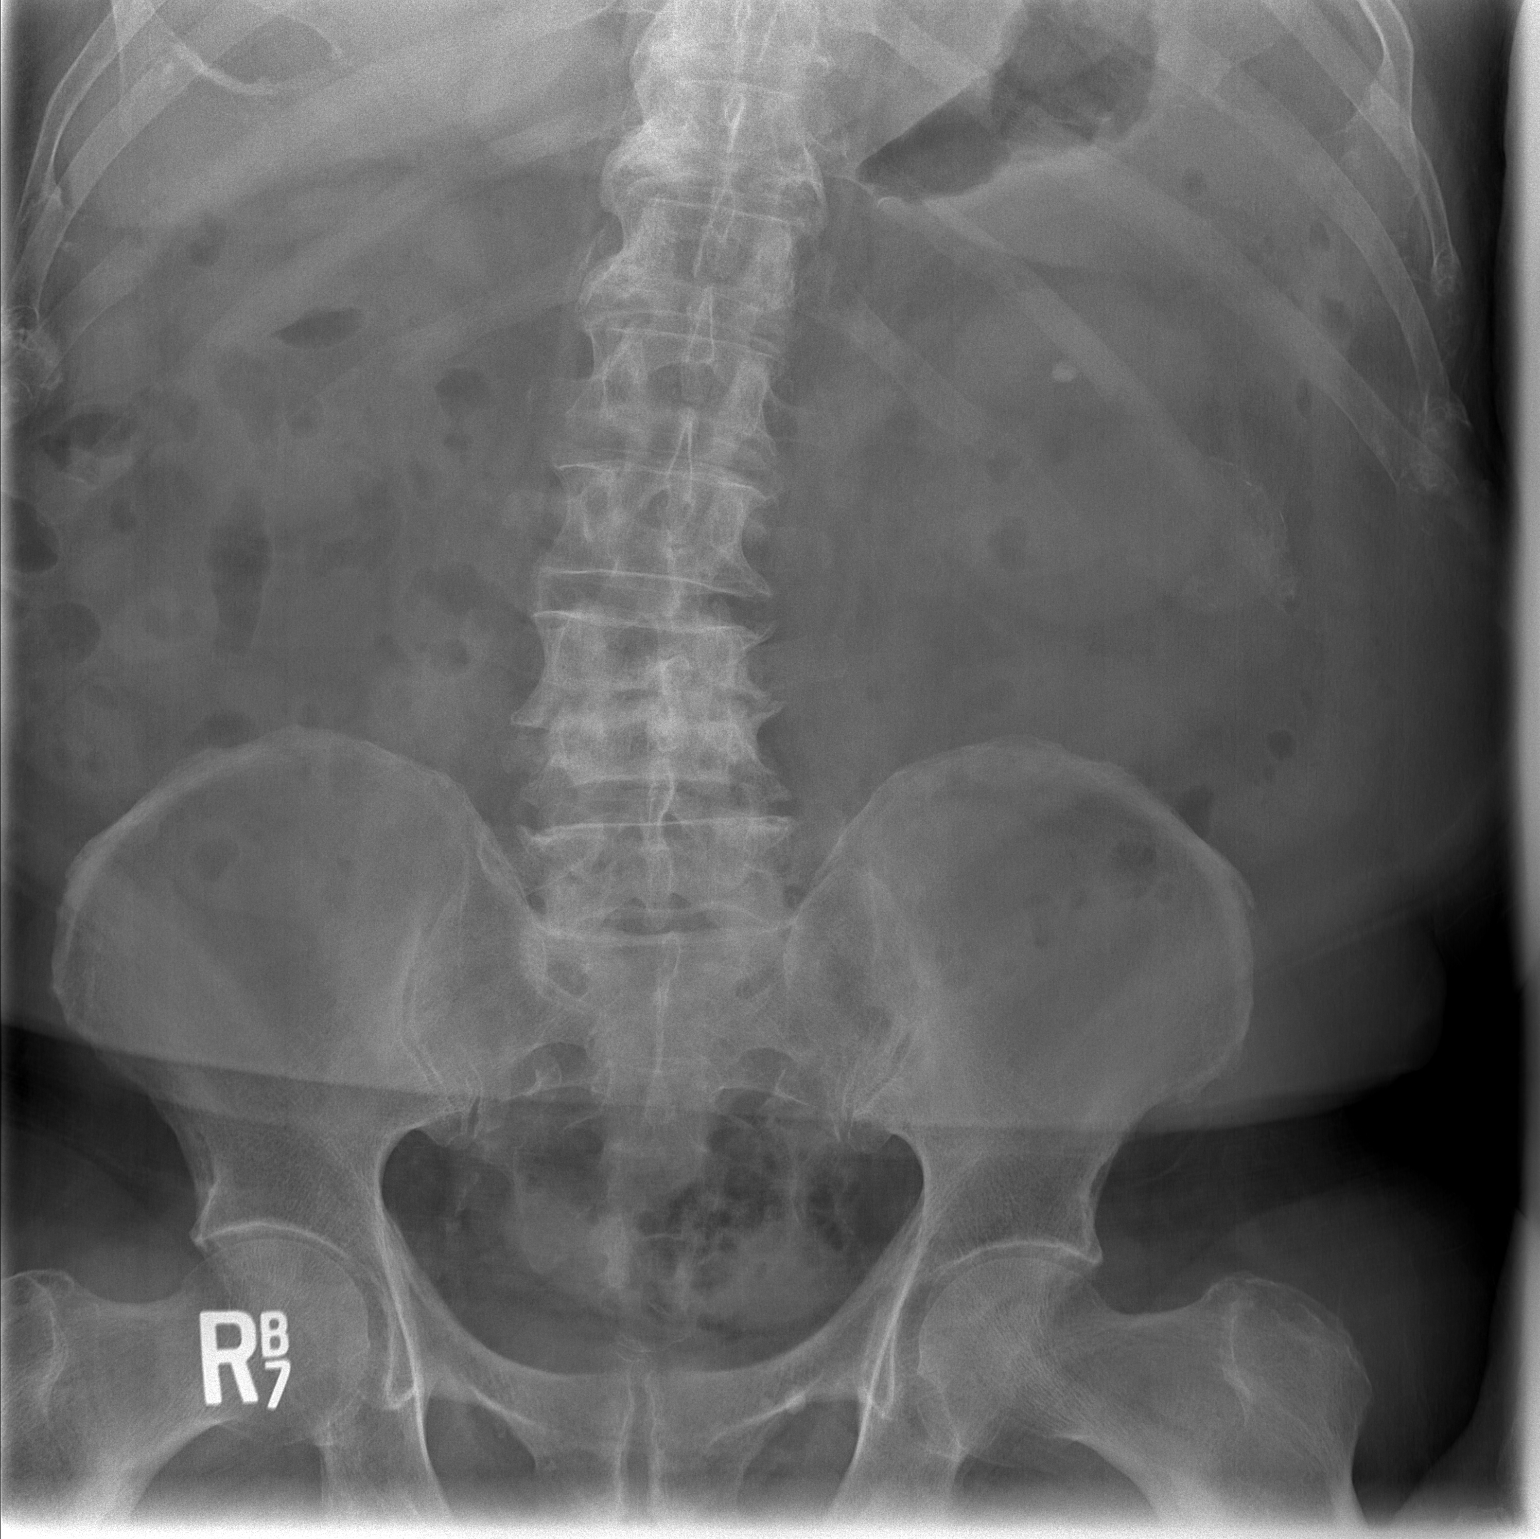

[1 of 1 positions shown; findings below may reference images not displayed]

FINDINGS: 0.7 cm stone projecting in the upper pole the left kidney is
unchanged. No other urinary tract stones are identified. Bowel gas
pattern is unremarkable. Marked loss of disc space height is seen at
L3-4 where there is to be erosive change in the endplates. Convex
right lumbar scoliosis noted.
IMPRESSION: No change in a 0.7 cm stone in the upper pole of the left kidney.

Abnormal appearance of the L3-4 disc interspace is suggestive of
remote discitis.

## 2019-10-08 DIAGNOSIS — I10 Essential (primary) hypertension: Secondary | ICD-10-CM | POA: Diagnosis not present

## 2019-10-09 DIAGNOSIS — R42 Dizziness and giddiness: Secondary | ICD-10-CM | POA: Diagnosis not present

## 2019-10-09 DIAGNOSIS — I10 Essential (primary) hypertension: Secondary | ICD-10-CM | POA: Diagnosis not present

## 2019-10-09 DIAGNOSIS — Z6828 Body mass index (BMI) 28.0-28.9, adult: Secondary | ICD-10-CM | POA: Diagnosis not present

## 2019-10-09 DIAGNOSIS — Z299 Encounter for prophylactic measures, unspecified: Secondary | ICD-10-CM | POA: Diagnosis not present

## 2019-10-09 DIAGNOSIS — R262 Difficulty in walking, not elsewhere classified: Secondary | ICD-10-CM | POA: Diagnosis not present

## 2019-10-09 DIAGNOSIS — M25562 Pain in left knee: Secondary | ICD-10-CM | POA: Diagnosis not present

## 2019-10-09 DIAGNOSIS — M170 Bilateral primary osteoarthritis of knee: Secondary | ICD-10-CM | POA: Diagnosis not present

## 2019-10-09 DIAGNOSIS — M25561 Pain in right knee: Secondary | ICD-10-CM | POA: Diagnosis not present

## 2019-10-10 ENCOUNTER — Other Ambulatory Visit: Payer: Self-pay

## 2019-10-10 DIAGNOSIS — N200 Calculus of kidney: Secondary | ICD-10-CM

## 2019-10-19 DIAGNOSIS — Z23 Encounter for immunization: Secondary | ICD-10-CM | POA: Diagnosis not present

## 2019-10-23 IMAGING — CR DG ABDOMEN 1V
1 series · 1 of 1 positions shown · non-contrast
Comparison: KUB 11/29/2017.

CLINICAL DATA: History of left renal stones. Patient status post
lithotripsy 11/29/2017.

EXAM:
ABDOMEN - 1 VIEW

[t abdomen supine]
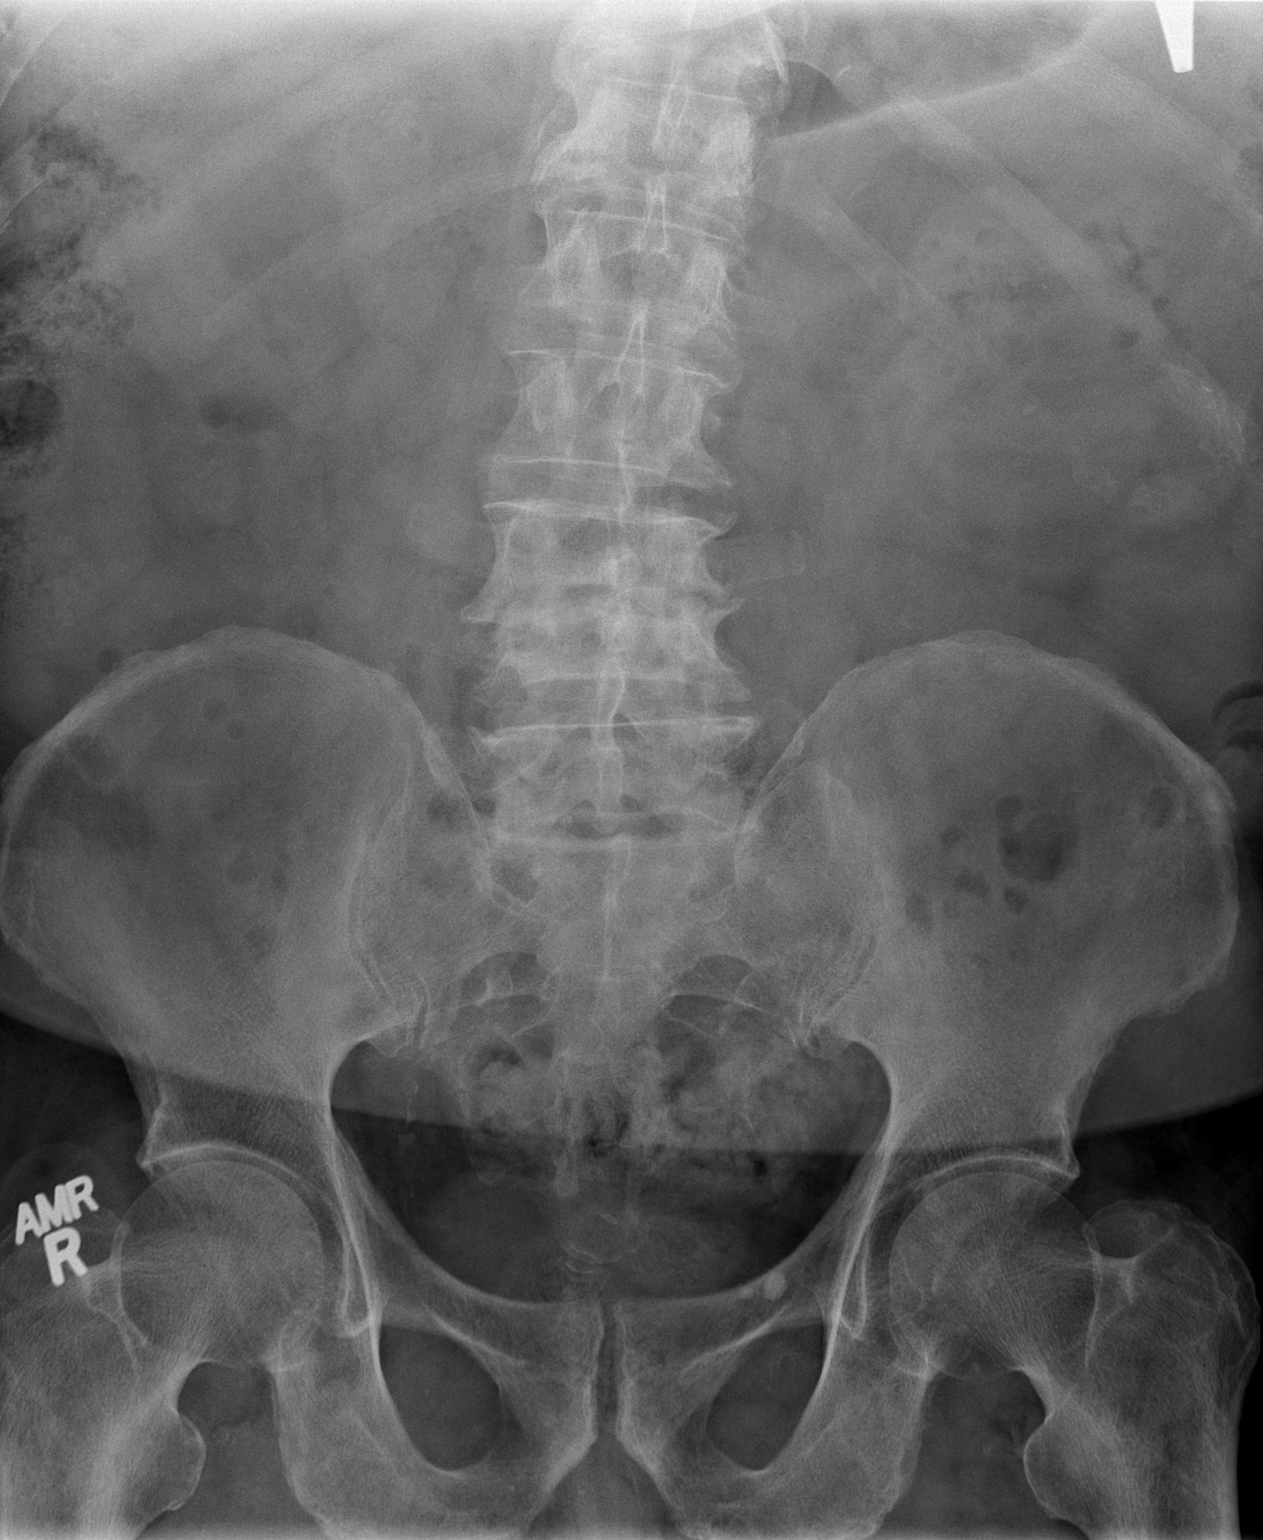

[1 of 1 positions shown; findings below may reference images not displayed]

FINDINGS: Previously seen left renal stone which had measured 0.7 cm in
diameter today measures 0.3 cm in diameter. No evidence of ureteral
stone is identified. No right urinary tract stones are seen. Bowel
gas pattern is normal. Convex right lumbar scoliosis and
degenerative disease at L3-4 appear unchanged.
IMPRESSION: Previously seen 0.7 cm left renal stone today measures 0.3 cm. No
new abnormality is identified.

## 2019-11-01 DIAGNOSIS — I10 Essential (primary) hypertension: Secondary | ICD-10-CM | POA: Diagnosis not present

## 2019-11-01 DIAGNOSIS — E1165 Type 2 diabetes mellitus with hyperglycemia: Secondary | ICD-10-CM | POA: Diagnosis not present

## 2019-11-01 DIAGNOSIS — G4700 Insomnia, unspecified: Secondary | ICD-10-CM | POA: Diagnosis not present

## 2019-11-01 DIAGNOSIS — Z299 Encounter for prophylactic measures, unspecified: Secondary | ICD-10-CM | POA: Diagnosis not present

## 2019-11-01 DIAGNOSIS — M79606 Pain in leg, unspecified: Secondary | ICD-10-CM | POA: Diagnosis not present

## 2019-11-07 DIAGNOSIS — I10 Essential (primary) hypertension: Secondary | ICD-10-CM | POA: Diagnosis not present

## 2019-11-21 DIAGNOSIS — E1165 Type 2 diabetes mellitus with hyperglycemia: Secondary | ICD-10-CM | POA: Diagnosis not present

## 2019-11-21 DIAGNOSIS — Z299 Encounter for prophylactic measures, unspecified: Secondary | ICD-10-CM | POA: Diagnosis not present

## 2019-11-21 DIAGNOSIS — J019 Acute sinusitis, unspecified: Secondary | ICD-10-CM | POA: Diagnosis not present

## 2019-11-21 DIAGNOSIS — F1120 Opioid dependence, uncomplicated: Secondary | ICD-10-CM | POA: Diagnosis not present

## 2019-11-21 DIAGNOSIS — I739 Peripheral vascular disease, unspecified: Secondary | ICD-10-CM | POA: Diagnosis not present

## 2019-12-08 DIAGNOSIS — I10 Essential (primary) hypertension: Secondary | ICD-10-CM | POA: Diagnosis not present

## 2019-12-13 DIAGNOSIS — Z299 Encounter for prophylactic measures, unspecified: Secondary | ICD-10-CM | POA: Diagnosis not present

## 2019-12-13 DIAGNOSIS — E1165 Type 2 diabetes mellitus with hyperglycemia: Secondary | ICD-10-CM | POA: Diagnosis not present

## 2019-12-13 DIAGNOSIS — I10 Essential (primary) hypertension: Secondary | ICD-10-CM | POA: Diagnosis not present

## 2019-12-13 DIAGNOSIS — J309 Allergic rhinitis, unspecified: Secondary | ICD-10-CM | POA: Diagnosis not present

## 2019-12-21 ENCOUNTER — Encounter (INDEPENDENT_AMBULATORY_CARE_PROVIDER_SITE_OTHER): Payer: Medicare HMO | Admitting: Ophthalmology

## 2019-12-26 DIAGNOSIS — Z299 Encounter for prophylactic measures, unspecified: Secondary | ICD-10-CM | POA: Diagnosis not present

## 2019-12-26 DIAGNOSIS — I739 Peripheral vascular disease, unspecified: Secondary | ICD-10-CM | POA: Diagnosis not present

## 2019-12-26 DIAGNOSIS — E1165 Type 2 diabetes mellitus with hyperglycemia: Secondary | ICD-10-CM | POA: Diagnosis not present

## 2019-12-26 DIAGNOSIS — M549 Dorsalgia, unspecified: Secondary | ICD-10-CM | POA: Diagnosis not present

## 2019-12-26 DIAGNOSIS — F1120 Opioid dependence, uncomplicated: Secondary | ICD-10-CM | POA: Diagnosis not present

## 2019-12-26 DIAGNOSIS — I10 Essential (primary) hypertension: Secondary | ICD-10-CM | POA: Diagnosis not present

## 2020-01-02 DIAGNOSIS — M9903 Segmental and somatic dysfunction of lumbar region: Secondary | ICD-10-CM | POA: Diagnosis not present

## 2020-01-02 DIAGNOSIS — S233XXA Sprain of ligaments of thoracic spine, initial encounter: Secondary | ICD-10-CM | POA: Diagnosis not present

## 2020-01-02 DIAGNOSIS — S338XXA Sprain of other parts of lumbar spine and pelvis, initial encounter: Secondary | ICD-10-CM | POA: Diagnosis not present

## 2020-01-02 DIAGNOSIS — M9902 Segmental and somatic dysfunction of thoracic region: Secondary | ICD-10-CM | POA: Diagnosis not present

## 2020-01-02 DIAGNOSIS — M9901 Segmental and somatic dysfunction of cervical region: Secondary | ICD-10-CM | POA: Diagnosis not present

## 2020-01-02 DIAGNOSIS — S134XXA Sprain of ligaments of cervical spine, initial encounter: Secondary | ICD-10-CM | POA: Diagnosis not present

## 2020-01-04 DIAGNOSIS — M9902 Segmental and somatic dysfunction of thoracic region: Secondary | ICD-10-CM | POA: Diagnosis not present

## 2020-01-04 DIAGNOSIS — S233XXA Sprain of ligaments of thoracic spine, initial encounter: Secondary | ICD-10-CM | POA: Diagnosis not present

## 2020-01-04 DIAGNOSIS — S134XXA Sprain of ligaments of cervical spine, initial encounter: Secondary | ICD-10-CM | POA: Diagnosis not present

## 2020-01-04 DIAGNOSIS — M9901 Segmental and somatic dysfunction of cervical region: Secondary | ICD-10-CM | POA: Diagnosis not present

## 2020-01-04 DIAGNOSIS — S338XXA Sprain of other parts of lumbar spine and pelvis, initial encounter: Secondary | ICD-10-CM | POA: Diagnosis not present

## 2020-01-04 DIAGNOSIS — M9903 Segmental and somatic dysfunction of lumbar region: Secondary | ICD-10-CM | POA: Diagnosis not present

## 2020-01-07 DIAGNOSIS — I10 Essential (primary) hypertension: Secondary | ICD-10-CM | POA: Diagnosis not present

## 2020-01-11 DIAGNOSIS — S134XXA Sprain of ligaments of cervical spine, initial encounter: Secondary | ICD-10-CM | POA: Diagnosis not present

## 2020-01-11 DIAGNOSIS — M9903 Segmental and somatic dysfunction of lumbar region: Secondary | ICD-10-CM | POA: Diagnosis not present

## 2020-01-11 DIAGNOSIS — M9902 Segmental and somatic dysfunction of thoracic region: Secondary | ICD-10-CM | POA: Diagnosis not present

## 2020-01-11 DIAGNOSIS — S233XXA Sprain of ligaments of thoracic spine, initial encounter: Secondary | ICD-10-CM | POA: Diagnosis not present

## 2020-01-11 DIAGNOSIS — S338XXA Sprain of other parts of lumbar spine and pelvis, initial encounter: Secondary | ICD-10-CM | POA: Diagnosis not present

## 2020-01-11 DIAGNOSIS — M9901 Segmental and somatic dysfunction of cervical region: Secondary | ICD-10-CM | POA: Diagnosis not present

## 2020-01-12 DIAGNOSIS — M170 Bilateral primary osteoarthritis of knee: Secondary | ICD-10-CM | POA: Diagnosis not present

## 2020-01-12 DIAGNOSIS — E1151 Type 2 diabetes mellitus with diabetic peripheral angiopathy without gangrene: Secondary | ICD-10-CM | POA: Diagnosis not present

## 2020-01-12 DIAGNOSIS — F1120 Opioid dependence, uncomplicated: Secondary | ICD-10-CM | POA: Diagnosis not present

## 2020-01-12 DIAGNOSIS — I10 Essential (primary) hypertension: Secondary | ICD-10-CM | POA: Diagnosis not present

## 2020-01-12 DIAGNOSIS — M25561 Pain in right knee: Secondary | ICD-10-CM | POA: Diagnosis not present

## 2020-01-12 DIAGNOSIS — M2560 Stiffness of unspecified joint, not elsewhere classified: Secondary | ICD-10-CM | POA: Diagnosis not present

## 2020-01-12 DIAGNOSIS — R262 Difficulty in walking, not elsewhere classified: Secondary | ICD-10-CM | POA: Diagnosis not present

## 2020-01-12 DIAGNOSIS — E1165 Type 2 diabetes mellitus with hyperglycemia: Secondary | ICD-10-CM | POA: Diagnosis not present

## 2020-01-12 DIAGNOSIS — I739 Peripheral vascular disease, unspecified: Secondary | ICD-10-CM | POA: Diagnosis not present

## 2020-01-12 DIAGNOSIS — Z299 Encounter for prophylactic measures, unspecified: Secondary | ICD-10-CM | POA: Diagnosis not present

## 2020-01-17 DIAGNOSIS — M9902 Segmental and somatic dysfunction of thoracic region: Secondary | ICD-10-CM | POA: Diagnosis not present

## 2020-01-17 DIAGNOSIS — S134XXA Sprain of ligaments of cervical spine, initial encounter: Secondary | ICD-10-CM | POA: Diagnosis not present

## 2020-01-17 DIAGNOSIS — M9901 Segmental and somatic dysfunction of cervical region: Secondary | ICD-10-CM | POA: Diagnosis not present

## 2020-01-17 DIAGNOSIS — S233XXA Sprain of ligaments of thoracic spine, initial encounter: Secondary | ICD-10-CM | POA: Diagnosis not present

## 2020-01-17 DIAGNOSIS — S338XXA Sprain of other parts of lumbar spine and pelvis, initial encounter: Secondary | ICD-10-CM | POA: Diagnosis not present

## 2020-01-17 DIAGNOSIS — M9903 Segmental and somatic dysfunction of lumbar region: Secondary | ICD-10-CM | POA: Diagnosis not present

## 2020-01-22 DIAGNOSIS — I10 Essential (primary) hypertension: Secondary | ICD-10-CM | POA: Diagnosis not present

## 2020-01-22 DIAGNOSIS — Z79899 Other long term (current) drug therapy: Secondary | ICD-10-CM | POA: Diagnosis not present

## 2020-01-22 DIAGNOSIS — Z1339 Encounter for screening examination for other mental health and behavioral disorders: Secondary | ICD-10-CM | POA: Diagnosis not present

## 2020-01-22 DIAGNOSIS — Z125 Encounter for screening for malignant neoplasm of prostate: Secondary | ICD-10-CM | POA: Diagnosis not present

## 2020-01-22 DIAGNOSIS — R5383 Other fatigue: Secondary | ICD-10-CM | POA: Diagnosis not present

## 2020-01-22 DIAGNOSIS — Z7189 Other specified counseling: Secondary | ICD-10-CM | POA: Diagnosis not present

## 2020-01-22 DIAGNOSIS — E538 Deficiency of other specified B group vitamins: Secondary | ICD-10-CM | POA: Diagnosis not present

## 2020-01-22 DIAGNOSIS — Z0000 Encounter for general adult medical examination without abnormal findings: Secondary | ICD-10-CM | POA: Diagnosis not present

## 2020-01-22 DIAGNOSIS — E7800 Pure hypercholesterolemia, unspecified: Secondary | ICD-10-CM | POA: Diagnosis not present

## 2020-01-22 DIAGNOSIS — E559 Vitamin D deficiency, unspecified: Secondary | ICD-10-CM | POA: Diagnosis not present

## 2020-01-22 DIAGNOSIS — Z1331 Encounter for screening for depression: Secondary | ICD-10-CM | POA: Diagnosis not present

## 2020-01-22 DIAGNOSIS — Z299 Encounter for prophylactic measures, unspecified: Secondary | ICD-10-CM | POA: Diagnosis not present

## 2020-01-22 DIAGNOSIS — Z1211 Encounter for screening for malignant neoplasm of colon: Secondary | ICD-10-CM | POA: Diagnosis not present

## 2020-01-24 ENCOUNTER — Encounter (INDEPENDENT_AMBULATORY_CARE_PROVIDER_SITE_OTHER): Payer: Medicare HMO | Admitting: Ophthalmology

## 2020-01-24 DIAGNOSIS — E1165 Type 2 diabetes mellitus with hyperglycemia: Secondary | ICD-10-CM | POA: Diagnosis not present

## 2020-01-24 DIAGNOSIS — Z299 Encounter for prophylactic measures, unspecified: Secondary | ICD-10-CM | POA: Diagnosis not present

## 2020-01-24 DIAGNOSIS — D649 Anemia, unspecified: Secondary | ICD-10-CM | POA: Diagnosis not present

## 2020-01-24 DIAGNOSIS — J309 Allergic rhinitis, unspecified: Secondary | ICD-10-CM | POA: Diagnosis not present

## 2020-01-24 DIAGNOSIS — I10 Essential (primary) hypertension: Secondary | ICD-10-CM | POA: Diagnosis not present

## 2020-01-24 DIAGNOSIS — E7800 Pure hypercholesterolemia, unspecified: Secondary | ICD-10-CM | POA: Diagnosis not present

## 2020-01-25 DIAGNOSIS — M9903 Segmental and somatic dysfunction of lumbar region: Secondary | ICD-10-CM | POA: Diagnosis not present

## 2020-01-25 DIAGNOSIS — M9901 Segmental and somatic dysfunction of cervical region: Secondary | ICD-10-CM | POA: Diagnosis not present

## 2020-01-25 DIAGNOSIS — S338XXA Sprain of other parts of lumbar spine and pelvis, initial encounter: Secondary | ICD-10-CM | POA: Diagnosis not present

## 2020-01-25 DIAGNOSIS — M9902 Segmental and somatic dysfunction of thoracic region: Secondary | ICD-10-CM | POA: Diagnosis not present

## 2020-01-25 DIAGNOSIS — S134XXA Sprain of ligaments of cervical spine, initial encounter: Secondary | ICD-10-CM | POA: Diagnosis not present

## 2020-01-25 DIAGNOSIS — S233XXA Sprain of ligaments of thoracic spine, initial encounter: Secondary | ICD-10-CM | POA: Diagnosis not present

## 2020-01-27 IMAGING — DX DG ABDOMEN 1V
2 series · 2 of 2 positions shown · non-contrast
Comparison: Radiograph dated 12/15/2017 and CT scan of the abdomen
dated 06/07/2017

CLINICAL DATA: Left renal stone.  Status post lithotripsy.

EXAM:
ABDOMEN - 1 VIEW

[abdomen kub (1 of 2)]
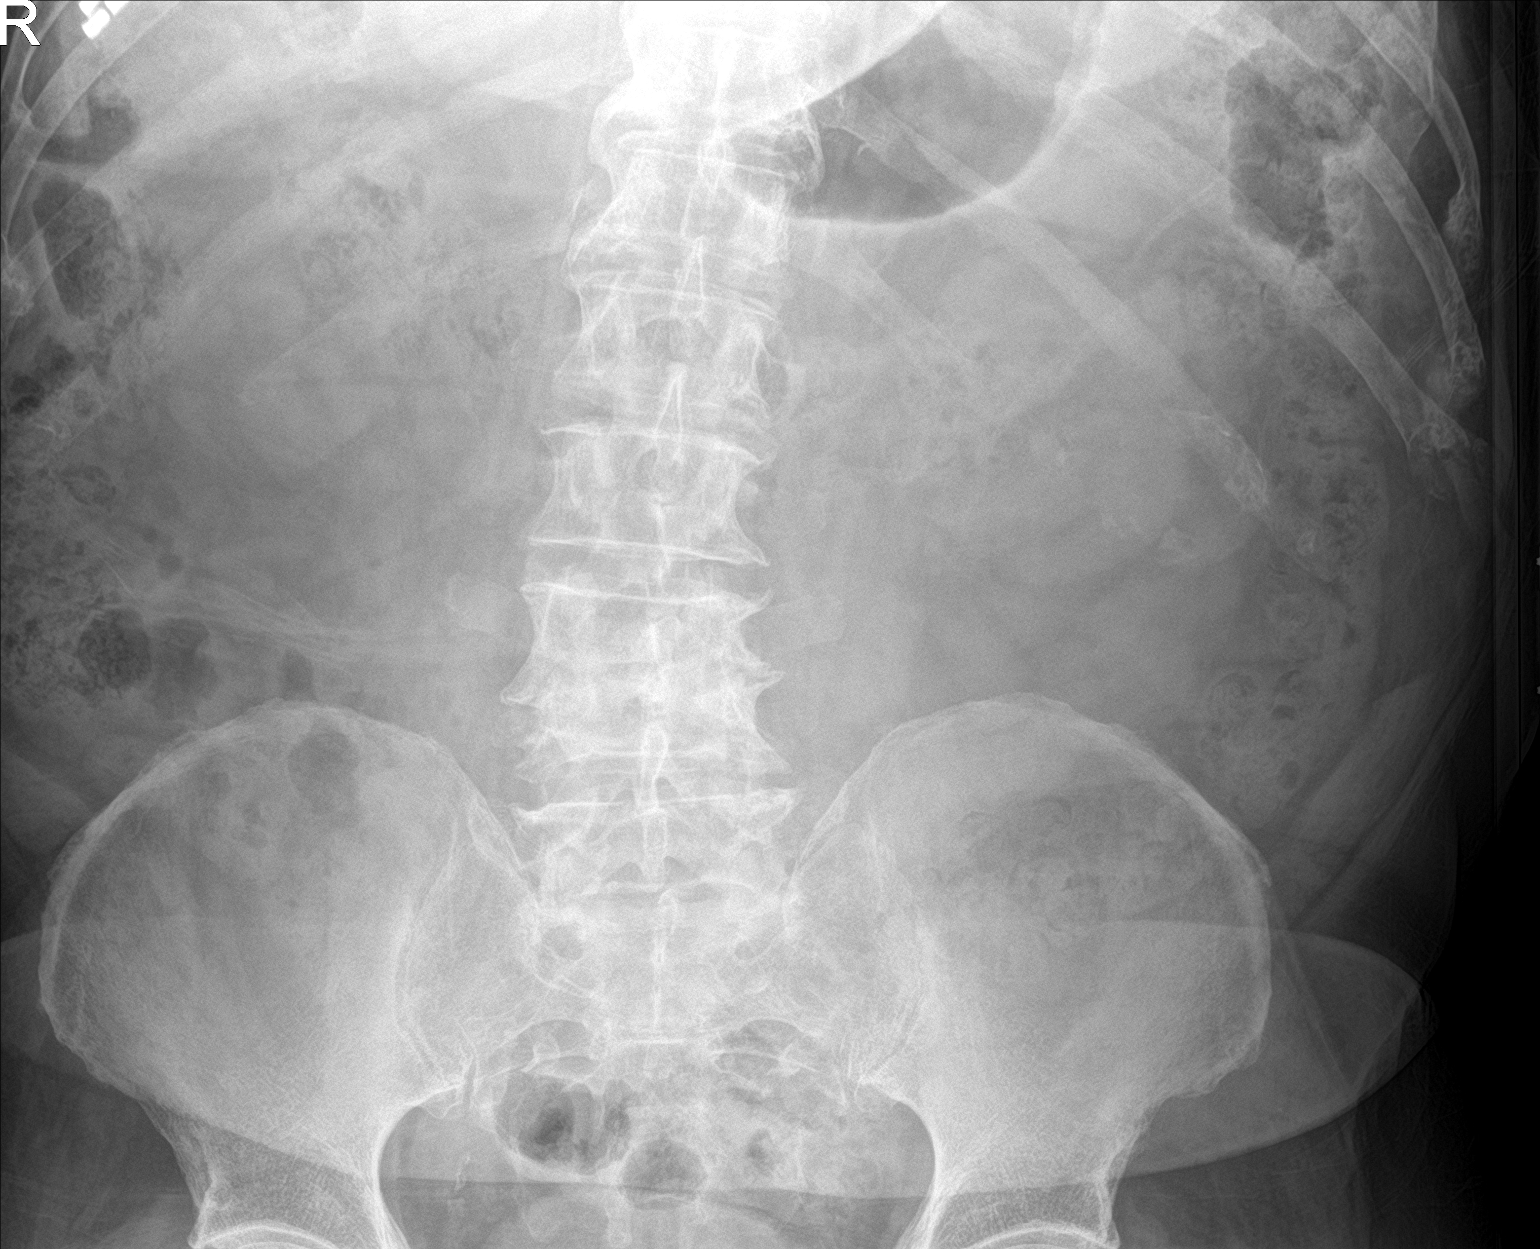

[abdomen kub (2 of 2)]
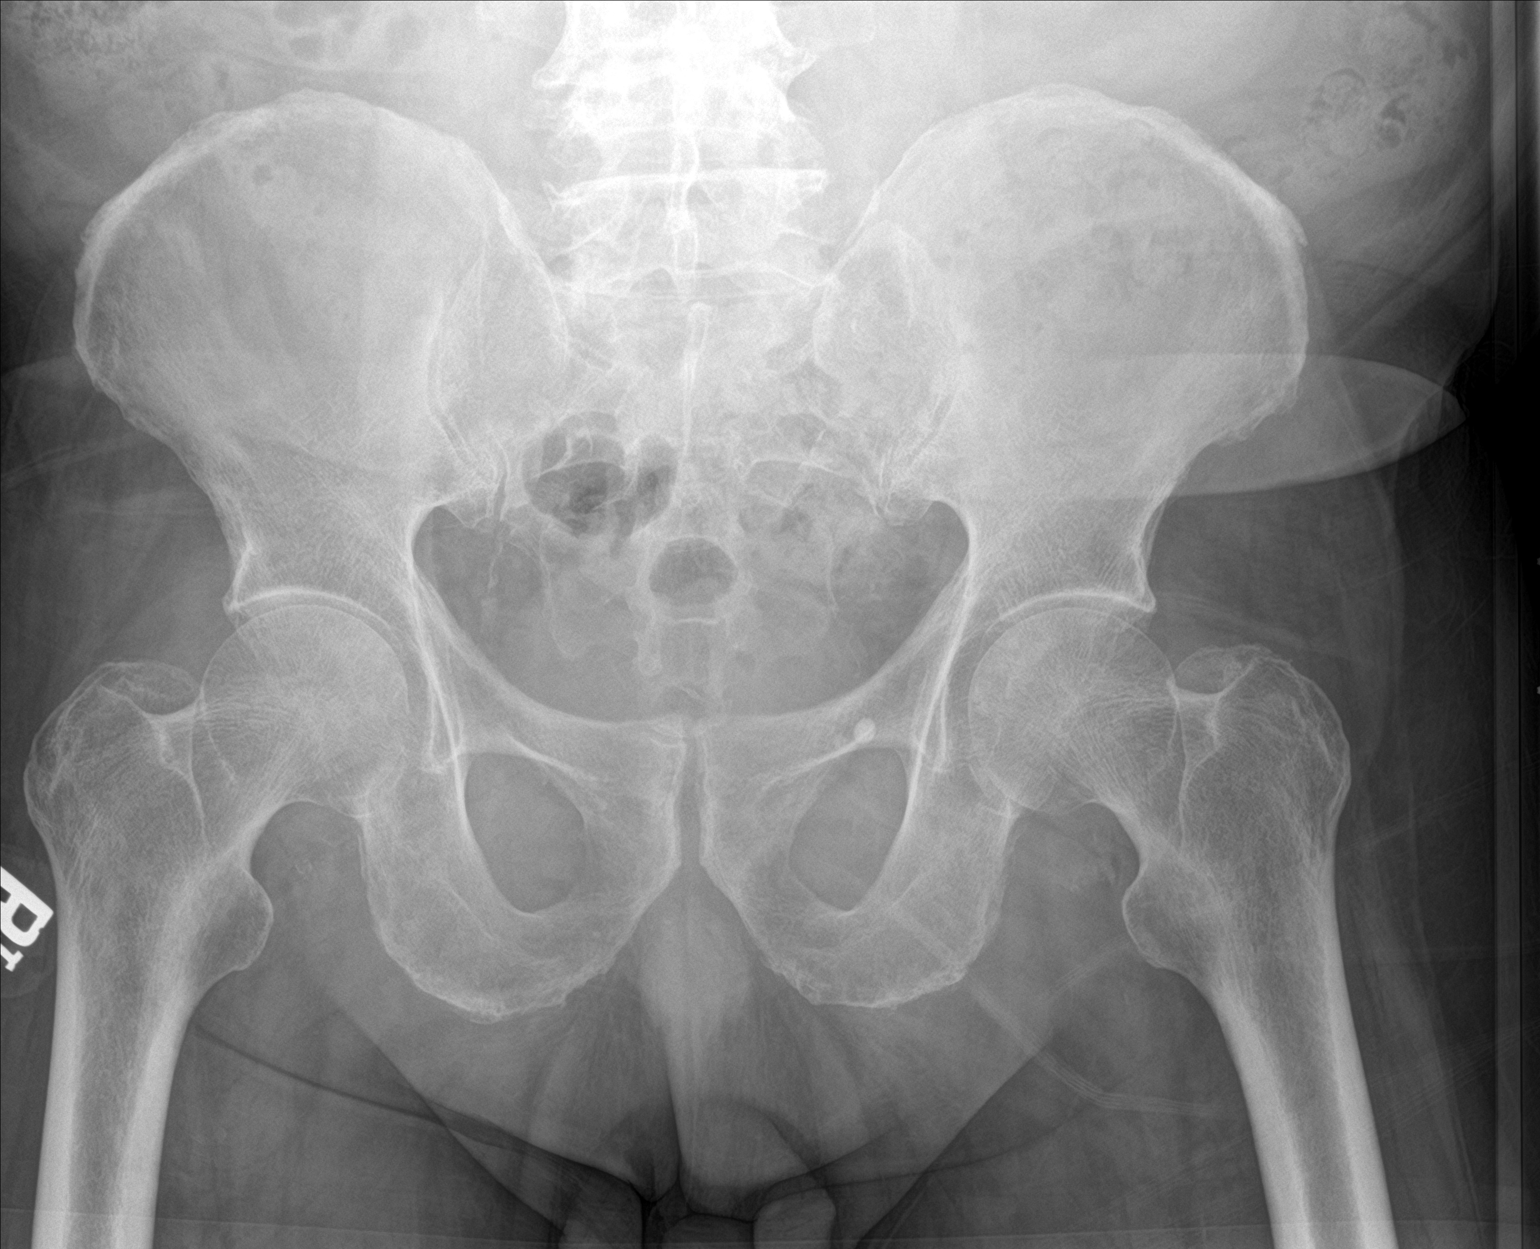

[2 of 2 positions shown; findings below may reference images not displayed]

FINDINGS: There is a 4 mm calcification overlying the lower pole the left
kidney, unchanged. Rim calcified 4.5 cm exophytic cyst on the lower
pole the left kidney also appears unchanged.

No new renal stones.  No stones along the course of either ureter.

Severe disc disease is present at L3-4 consistent with previous
discitis.

Bowel gas pattern is normal.
IMPRESSION: No change in the appearance of the abdomen. Stable small stone in
the lower pole of the left kidney.

## 2020-01-31 DIAGNOSIS — S233XXA Sprain of ligaments of thoracic spine, initial encounter: Secondary | ICD-10-CM | POA: Diagnosis not present

## 2020-01-31 DIAGNOSIS — Z299 Encounter for prophylactic measures, unspecified: Secondary | ICD-10-CM | POA: Diagnosis not present

## 2020-01-31 DIAGNOSIS — M9902 Segmental and somatic dysfunction of thoracic region: Secondary | ICD-10-CM | POA: Diagnosis not present

## 2020-01-31 DIAGNOSIS — R251 Tremor, unspecified: Secondary | ICD-10-CM | POA: Diagnosis not present

## 2020-01-31 DIAGNOSIS — R42 Dizziness and giddiness: Secondary | ICD-10-CM | POA: Diagnosis not present

## 2020-01-31 DIAGNOSIS — E1142 Type 2 diabetes mellitus with diabetic polyneuropathy: Secondary | ICD-10-CM | POA: Diagnosis not present

## 2020-01-31 DIAGNOSIS — I10 Essential (primary) hypertension: Secondary | ICD-10-CM | POA: Diagnosis not present

## 2020-01-31 DIAGNOSIS — R5383 Other fatigue: Secondary | ICD-10-CM | POA: Diagnosis not present

## 2020-01-31 DIAGNOSIS — S134XXA Sprain of ligaments of cervical spine, initial encounter: Secondary | ICD-10-CM | POA: Diagnosis not present

## 2020-01-31 DIAGNOSIS — E11319 Type 2 diabetes mellitus with unspecified diabetic retinopathy without macular edema: Secondary | ICD-10-CM | POA: Diagnosis not present

## 2020-01-31 DIAGNOSIS — M9903 Segmental and somatic dysfunction of lumbar region: Secondary | ICD-10-CM | POA: Diagnosis not present

## 2020-01-31 DIAGNOSIS — S338XXA Sprain of other parts of lumbar spine and pelvis, initial encounter: Secondary | ICD-10-CM | POA: Diagnosis not present

## 2020-01-31 DIAGNOSIS — R270 Ataxia, unspecified: Secondary | ICD-10-CM | POA: Diagnosis not present

## 2020-01-31 DIAGNOSIS — M9901 Segmental and somatic dysfunction of cervical region: Secondary | ICD-10-CM | POA: Diagnosis not present

## 2020-01-31 DIAGNOSIS — E1165 Type 2 diabetes mellitus with hyperglycemia: Secondary | ICD-10-CM | POA: Diagnosis not present

## 2020-01-31 DIAGNOSIS — F1120 Opioid dependence, uncomplicated: Secondary | ICD-10-CM | POA: Diagnosis not present

## 2020-02-06 DIAGNOSIS — Z299 Encounter for prophylactic measures, unspecified: Secondary | ICD-10-CM | POA: Diagnosis not present

## 2020-02-06 DIAGNOSIS — I10 Essential (primary) hypertension: Secondary | ICD-10-CM | POA: Diagnosis not present

## 2020-02-06 DIAGNOSIS — J309 Allergic rhinitis, unspecified: Secondary | ICD-10-CM | POA: Diagnosis not present

## 2020-02-06 DIAGNOSIS — H8110 Benign paroxysmal vertigo, unspecified ear: Secondary | ICD-10-CM | POA: Diagnosis not present

## 2020-02-07 DIAGNOSIS — I10 Essential (primary) hypertension: Secondary | ICD-10-CM | POA: Diagnosis not present

## 2020-02-07 DIAGNOSIS — S233XXA Sprain of ligaments of thoracic spine, initial encounter: Secondary | ICD-10-CM | POA: Diagnosis not present

## 2020-02-07 DIAGNOSIS — M9901 Segmental and somatic dysfunction of cervical region: Secondary | ICD-10-CM | POA: Diagnosis not present

## 2020-02-07 DIAGNOSIS — S338XXA Sprain of other parts of lumbar spine and pelvis, initial encounter: Secondary | ICD-10-CM | POA: Diagnosis not present

## 2020-02-07 DIAGNOSIS — S134XXA Sprain of ligaments of cervical spine, initial encounter: Secondary | ICD-10-CM | POA: Diagnosis not present

## 2020-02-07 DIAGNOSIS — M9902 Segmental and somatic dysfunction of thoracic region: Secondary | ICD-10-CM | POA: Diagnosis not present

## 2020-02-07 DIAGNOSIS — M9903 Segmental and somatic dysfunction of lumbar region: Secondary | ICD-10-CM | POA: Diagnosis not present

## 2020-02-14 ENCOUNTER — Other Ambulatory Visit: Payer: Self-pay | Admitting: *Deleted

## 2020-02-14 ENCOUNTER — Encounter: Payer: Self-pay | Admitting: *Deleted

## 2020-02-19 ENCOUNTER — Encounter: Payer: Self-pay | Admitting: Diagnostic Neuroimaging

## 2020-02-19 ENCOUNTER — Ambulatory Visit: Payer: Medicare HMO | Admitting: Diagnostic Neuroimaging

## 2020-02-19 ENCOUNTER — Other Ambulatory Visit: Payer: Self-pay

## 2020-02-19 VITALS — BP 115/67 | HR 81 | Ht 67.0 in | Wt 165.4 lb

## 2020-02-19 DIAGNOSIS — R42 Dizziness and giddiness: Secondary | ICD-10-CM

## 2020-02-19 DIAGNOSIS — H814 Vertigo of central origin: Secondary | ICD-10-CM | POA: Diagnosis not present

## 2020-02-19 NOTE — Patient Instructions (Signed)
-   Check MRI / MRA and echocardiogram

## 2020-02-19 NOTE — Progress Notes (Signed)
[facility]  PATIENT: Thomas Adkins DOB: [birth year]  REFERRING CLINICIAN: Glenda Chroman, MD HISTORY FROM: patient REASON FOR VISIT: new consult    HISTORICAL  CHIEF COMPLAINT:  Chief Complaint  Patient presents with  . Tremors    rm 6, New Pt, wife- Lelon Frohlich "2 months of nausea, weakness, dizzy spells; happens randomly"  . Dizziness    HISTORY OF PRESENT ILLNESS:   79 year old male here for evaluation of dizziness, nausea, weakness.  For past 1 to 2 months patient has had onset of orthostatic lightheadedness, dizziness, nausea.  Symptoms worse when he stands up for more than 10 minutes at a time.  If he sits down or lays down symptoms go away.  No spinning sensation.  No slurred speech or trouble talking.  No double vision.  Patient has history of diabetes, with A1c in the sevens.  Also with hypertension.  His gait and balance has declined over time.  He uses a single-point cane.  He has numbness in his feet.   REVIEW OF SYSTEMS: Full 14 system review of systems performed and negative with exception of: As per HPI.  ALLERGIES: Allergies  Allergen Reactions  . Bactrim [Sulfamethoxazole-Trimethoprim] Other (See Comments)    Fatigue, dry mouth  . Hydrocodone Nausea And Vomiting  . Januvia [Sitagliptin] Other (See Comments)    fatigue  . Norvasc [Amlodipine] Other (See Comments)    edema  . Primidone Nausea Only    HOME MEDICATIONS: Outpatient Medications Prior to Visit  Medication Sig Dispense Refill  . Alpha-Lipoic Acid 100 MG CAPS Take 1 capsule by mouth daily.    Marland Kitchen amLODipine (NORVASC) 5 MG tablet 5 mg daily.    Marland Kitchen aspirin 81 MG tablet Take 81 mg by mouth daily.     . candesartan (ATACAND) 16 MG tablet 16 mg daily.    . diclofenac Sodium (VOLTAREN) 1 % GEL     . ferrous gluconate (FERGON) 324 MG tablet Take 324 mg by mouth daily with breakfast.    . glipiZIDE (GLUCOTROL XL) 10 MG 24 hr tablet     . meclizine (ANTIVERT) 25 MG tablet as needed.    .  naproxen sodium (ALEVE) 220 MG tablet Take 440 mg by mouth 2 (two) times daily as needed.    Marland Kitchen omeprazole (PRILOSEC) 20 MG capsule Take 20 mg by mouth daily.    . ondansetron (ZOFRAN) 4 MG tablet Take 4 mg by mouth every 8 (eight) hours as needed for nausea or vomiting.    Marland Kitchen scopolamine (TRANSDERM-SCOP) 1 MG/3DAYS     . simvastatin (ZOCOR) 20 MG tablet Take 20 mg by mouth daily.    . traMADol (ULTRAM) 50 MG tablet Take 50 mg by mouth every 6 (six) hours as needed.    . gabapentin (NEURONTIN) 300 MG capsule Take 300 mg by mouth at bedtime. (Patient not taking: Reported on 02/19/2020)    . losartan (COZAAR) 50 MG tablet Take one BID (Patient not taking: Reported on 02/19/2020) 60 tablet 0  . metFORMIN (GLUCOPHAGE-XR) 500 MG 24 hr tablet Take 500-1,000 mg by mouth See admin instructions. Pt takes med every other day. 1000 mg in the morning, and 500 mg in the evening (Patient not taking: Reported on 02/19/2020)    . metoprolol (LOPRESSOR) 50 MG tablet Take 50 mg by mouth 2 (two) times daily.  (Patient not taking: Reported on 02/19/2020)    . pioglitazone (ACTOS) 15 MG tablet Take 15 mg by mouth daily. (Patient not taking: Reported  on 02/19/2020)    . sucralfate (CARAFATE) 1 g tablet  (Patient not taking: Reported on 02/19/2020)    . levofloxacin (LEVAQUIN) 750 MG tablet Take 1 tablet (750 mg total) by mouth daily. (Patient not taking: Reported on 12/20/2017) 21 tablet 0   No facility-administered medications prior to visit.    PAST MEDICAL HISTORY: Past Medical History:  Diagnosis Date  . Anxiety   . Arthritis   . Ataxia   . B12 deficiency   . Back ache    back problems  . Cataract   . Depression   . Diabetes ([place]) 06/23/2016   type 2  . Diabetic retinopathy ([place])   . Dizziness   . Fatigue   . GERD (gastroesophageal reflux disease)   . Hearing loss   . High blood pressure 06/23/2016  . High cholesterol 06/23/2016  . History of kidney stones   . Insomnia   . Kidney stone   . Lumbar  radiculopathy   . Osteoarthritis   . Osteomyelitis ([place])    L spine  . Polyneuropathy   . PUD (peptic ulcer disease)   . Tremor     PAST SURGICAL HISTORY: Past Surgical History:  Procedure Laterality Date  . CATARACT EXTRACTION     bilateral  . EXTRACORPOREAL SHOCK WAVE LITHOTRIPSY Left 11/29/2017   Procedure: LEFT EXTRACORPOREAL SHOCK WAVE LITHOTRIPSY (ESWL);  Surgeon: Franchot Gallo, MD;  Location: [facility];  Service: Urology;  Laterality: Left;  . EYE SURGERY    . IR LUMBAR DISC ASPIRATION W/IMG GUIDE  09/09/2017  . JOINT REPLACEMENT     toe 40 yrs ago    FAMILY HISTORY: Family History  Problem Relation Age of Onset  . Cancer Mother   . Heart attack Father     SOCIAL HISTORY: Social History   Socioeconomic History  . Marital status: Married    Spouse name: Lelon Frohlich  . Number of children: Not on file  . Years of education: 14  . Highest education level: Some college, no degree  Occupational History  . Occupation: retired  Tobacco Use  . Smoking status: Former Research scientist (life sciences)  . Smokeless tobacco: Never Used  . Tobacco comment: quit > 40 yrs ago  Vaping Use  . Vaping Use: Never used  Substance and Sexual Activity  . Alcohol use: No  . Drug use: No  . Sexual activity: Not Currently  Other Topics Concern  . Not on file  Social History Narrative   Lives with wife   No caffeine   Social Determinants of Health   Financial Resource Strain:   . Difficulty of Paying Living Expenses:   Food Insecurity:   . Worried About Charity fundraiser in the Last Year:   . Arboriculturist in the Last Year:   Transportation Needs:   . Film/video editor (Medical):   Marland Kitchen Lack of Transportation (Non-Medical):   Physical Activity:   . Days of Exercise per Week:   . Minutes of Exercise per Session:   Stress:   . Feeling of Stress :   Social Connections:   . Frequency of Communication with Friends and Family:   . Frequency of Social Gatherings with Friends and Family:   . Attends  Religious Services:   . Active Member of Clubs or Organizations:   . Attends Archivist Meetings:   Marland Kitchen Marital Status:   Intimate Partner Violence:   . Fear of Current or Ex-Partner:   . Emotionally Abused:   Marland Kitchen Physically  Abused:   . Sexually Abused:      PHYSICAL EXAM  GENERAL EXAM/CONSTITUTIONAL: Vitals:  Vitals:   02/19/20 1016  BP: 115/67  Pulse: 81  Weight: 165 lb 6.4 oz (75 kg)  Height: 5\' 7"  (1.702 m)     Body mass index is 25.91 kg/m. Wt Readings from Last 3 Encounters:  02/19/20 165 lb 6.4 oz (75 kg)  05/24/19 177 lb 6.4 oz (80.5 kg)  12/20/17 175 lb (79.4 kg)    Orthostatic VS for the past 24 hrs (Last 3 readings):  BP- Lying Pulse- Lying BP- Sitting Pulse- Sitting BP- Standing at 0 minutes Pulse- Standing at 0 minutes  02/19/20 1138 134/76 71 138/75 74 116/71 87     Patient is in no distress; well developed, nourished and groomed; neck is supple  CARDIOVASCULAR:  Examination of carotid arteries is normal; no carotid bruits  Regular rate and rhythm, no murmurs  Examination of peripheral vascular system by observation and palpation is normal  EYES:  Ophthalmoscopic exam of optic discs and posterior segments is normal; no papilledema or hemorrhages  No exam data present  MUSCULOSKELETAL:  Gait, strength, tone, movements noted in Neurologic exam below  NEUROLOGIC: MENTAL STATUS:  No flowsheet data found.  awake, alert, oriented to person, place and time  recent and remote memory intact  normal attention and concentration  language fluent, comprehension intact, naming intact  fund of knowledge appropriate  CRANIAL NERVE:   2nd - no papilledema on fundoscopic exam  2nd, 3rd, 4th, 6th - pupils equal and reactive to light, visual fields full to confrontation, extraocular muscles intact, no nystagmus  5th - facial sensation symmetric  7th - facial strength symmetric  8th - hearing intact  9th - palate elevates  symmetrically, uvula midline  11th - shoulder shrug symmetric  12th - tongue protrusion midline  HOARSE  MOTOR:   POSTURAL TREMOR IN HANDS  NO BRADYKINESIA OR COGWHEELING RIGIDITY  normal bulk and tone, full strength in the BUE, BLE; EXCEPT BILATERAL DORSIFLEXION WEAKNESS 4/5  SENSORY:   normal and symmetric to light touch, temperature, vibration  COORDINATION:   finger-nose-finger, fine finger movements normal  REFLEXES:   deep tendon reflexes TRACE and symmetric; ABSENT IN ANKLES  GAIT/STATION:   narrow based gait; USES CANE; SLOW AND CAUTIOUS     DIAGNOSTIC DATA (LABS, IMAGING, TESTING) - I reviewed patient records, labs, notes, testing and imaging myself where available.  Lab Results  Component Value Date   WBC 10.0 09/09/2017   HGB 13.8 09/09/2017   HCT 39.8 09/09/2017   MCV 91.9 09/09/2017   PLT 179 09/09/2017      Component Value Date/Time   NA 139 10/25/2017 1130   K 4.6 10/25/2017 1130   CL 107 10/25/2017 1130   CO2 25 10/25/2017 1130   GLUCOSE 176 (H) 10/25/2017 1130   BUN 24 10/25/2017 1130   CREATININE 1.02 10/25/2017 1130   CALCIUM 9.3 10/25/2017 1130   PROT 6.5 09/02/2017 1004   AST 14 09/02/2017 1004   ALT 17 09/02/2017 1004   BILITOT 0.7 09/02/2017 1004   GFRNONAA >60 09/09/2017 0640   GFRAA >60 09/09/2017 0640   No results found for: CHOL, HDL, LDLCALC, LDLDIRECT, TRIG, CHOLHDL Lab Results  Component Value Date   HGBA1C 7.2 05/19/2019   No results found for: VITAMINB12 No results found for: TSH     ASSESSMENT AND PLAN  79 y.o. 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  We will proceed with further work-up to rule out other causes.  Dx:  1. Dizziness   2. Vertigo of central origin       PLAN:  ORTHOSTATIC LIGHTHEADEDNESS, ATAXIA, DIZZINESS, NAUSEA (likely orthostatic hypotension related to  diabetes and autonomic dysfunction) - SBP drops 22 pts from sitting to standing - check MRI brain, MRA head / neck - check TTE  Orders Placed This Encounter  Procedures  . MR ANGIO HEAD WO CONTRAST  . MR ANGIO NECK W WO CONTRAST  . MR BRAIN/IAC W WO CONTRAST  . ECHOCARDIOGRAM COMPLETE BUBBLE STUDY   Return pending testing, for pending if symptoms worsen or fail to improve.    Penni Bombard, MD 7/86/7672, 09:47 AM Certified in Neurology, Neurophysiology and Neuroimaging  [facility] [address] [place], [place] [address] [phone]

## 2020-02-20 ENCOUNTER — Telehealth: Payer: Self-pay | Admitting: Diagnostic Neuroimaging

## 2020-02-20 NOTE — Telephone Encounter (Signed)
[employer] pending  

## 2020-02-21 DIAGNOSIS — M9901 Segmental and somatic dysfunction of cervical region: Secondary | ICD-10-CM | POA: Diagnosis not present

## 2020-02-21 DIAGNOSIS — S233XXA Sprain of ligaments of thoracic spine, initial encounter: Secondary | ICD-10-CM | POA: Diagnosis not present

## 2020-02-21 DIAGNOSIS — S338XXA Sprain of other parts of lumbar spine and pelvis, initial encounter: Secondary | ICD-10-CM | POA: Diagnosis not present

## 2020-02-21 DIAGNOSIS — M9903 Segmental and somatic dysfunction of lumbar region: Secondary | ICD-10-CM | POA: Diagnosis not present

## 2020-02-21 DIAGNOSIS — S134XXA Sprain of ligaments of cervical spine, initial encounter: Secondary | ICD-10-CM | POA: Diagnosis not present

## 2020-02-21 DIAGNOSIS — M9902 Segmental and somatic dysfunction of thoracic region: Secondary | ICD-10-CM | POA: Diagnosis not present

## 2020-02-23 ENCOUNTER — Other Ambulatory Visit: Payer: Self-pay

## 2020-02-23 ENCOUNTER — Ambulatory Visit (HOSPITAL_COMMUNITY): Payer: Medicare HMO | Attending: Internal Medicine | Admitting: Physical Therapy

## 2020-02-23 DIAGNOSIS — Z299 Encounter for prophylactic measures, unspecified: Secondary | ICD-10-CM | POA: Diagnosis not present

## 2020-02-23 DIAGNOSIS — M436 Torticollis: Secondary | ICD-10-CM | POA: Diagnosis not present

## 2020-02-23 DIAGNOSIS — H8113 Benign paroxysmal vertigo, bilateral: Secondary | ICD-10-CM | POA: Insufficient documentation

## 2020-02-23 DIAGNOSIS — M6281 Muscle weakness (generalized): Secondary | ICD-10-CM | POA: Insufficient documentation

## 2020-02-23 DIAGNOSIS — R2689 Other abnormalities of gait and mobility: Secondary | ICD-10-CM | POA: Diagnosis not present

## 2020-02-23 DIAGNOSIS — R42 Dizziness and giddiness: Secondary | ICD-10-CM | POA: Insufficient documentation

## 2020-02-23 DIAGNOSIS — M549 Dorsalgia, unspecified: Secondary | ICD-10-CM | POA: Diagnosis not present

## 2020-02-23 DIAGNOSIS — I10 Essential (primary) hypertension: Secondary | ICD-10-CM | POA: Diagnosis not present

## 2020-02-23 NOTE — Therapy (Signed)
[place], [place], [address] Phone: [phone]   Fax:  [phone]  Physical Therapy Evaluation  Patient Details  Name: Thomas Adkins MRN: [id number] Date of Birth: 11-12-1940 Referring Provider (PT): Jerene Bears   Encounter Date: 02/23/2020   PT End of Session - 02/23/20 1638    Visit Number 1    Number of Visits 12    Date for PT Re-Evaluation 04/05/20    Authorization Type [employer] requested 12    Progress Note Due on Visit 10    PT Start Time 1045    PT Stop Time 1140    PT Time Calculation (min) 55 min    Activity Tolerance Patient tolerated treatment well    Behavior During Therapy [facility] for tasks assessed/performed           Past Medical History:  Diagnosis Date  . Anxiety   . Arthritis   . Ataxia   . B12 deficiency   . Back ache    back problems  . Cataract   . Depression   . Diabetes ([place]) 06/23/2016   type 2  . Diabetic retinopathy ([place])   . Dizziness   . Fatigue   . GERD (gastroesophageal reflux disease)   . Hearing loss   . High blood pressure 06/23/2016  . High cholesterol 06/23/2016  . History of kidney stones   . Insomnia   . Kidney stone   . Lumbar radiculopathy   . Osteoarthritis   . Osteomyelitis ([place])    L spine  . Polyneuropathy   . PUD (peptic ulcer disease)   . Tremor     Past Surgical History:  Procedure Laterality Date  . CATARACT EXTRACTION     bilateral  . EXTRACORPOREAL SHOCK WAVE LITHOTRIPSY Left 11/29/2017   Procedure: LEFT EXTRACORPOREAL SHOCK WAVE LITHOTRIPSY (ESWL);  Surgeon: Franchot Gallo, MD;  Location: [facility];  Service: Urology;  Laterality: Left;  . EYE SURGERY    . IR LUMBAR DISC ASPIRATION W/IMG GUIDE  09/09/2017  . JOINT REPLACEMENT     toe 40 yrs ago    There were no vitals filed for this visit.        [facility] PT Assessment - 02/23/20 0001      Assessment   Medical Diagnosis central vertigo     Referring Provider (PT) Vyas Dhruv     Onset Date/Surgical Date 02/27/19    Next MD Visit 03/19/2020    Prior Therapy for back       Precautions   Precautions None      Restrictions   Weight Bearing Restrictions No      Balance Screen   Has the patient fallen in the past 6 months No      [place] residence      Prior Function   Level of Independence Independent with community mobility with device      Cognition   Overall Cognitive Status Within Functional Limits for tasks assessed      Observation/Other Assessments   Focus on Therapeutic Outcomes (FOTO)  40 ; limited 60 %      ROM / Strength   AROM / PROM / Strength AROM;Strength      AROM   AROM Assessment Site Cervical    Cervical Flexion wfl    Cervical Extension wfl    Cervical - Right Side Bend 18    Cervical - Left Side Bend 15    Cervical -  Right Rotation 40    Cervical - Left Rotation 45      Strength   Strength Assessment Site Hip;Knee;Ankle    Right/Left Hip Right;Left    Right Hip Flexion 3-/5    Right Hip ABduction 3-/5    Left Hip Flexion 3-/5    Left Hip ABduction 3-/5    Right/Left Knee Right;Left    Right Knee Extension 3+/5    Left Knee Extension 3+/5    Right/Left Ankle Right;Left    Right Ankle Dorsiflexion 3/5    Left Ankle Dorsiflexion 3/5                  Vestibular Assessment - 02/23/20 0001      Symptom Behavior   Subjective history of current problem Pt states that he was at work and was pulling on a drawer that was stuck when he fell, initially he felt alright but the next day he could hardly get out of bed.  Now he feels like he is very weak and he gets dizzy if he bends down or stands up.      Type of Dizziness  Lightheadedness    Frequency of Dizziness multiple times a day, everytime he stands     Duration of Dizziness a minute     Symptom Nature Motion provoked    Aggravating Factors Sit to stand    Relieving Factors Rest      Oculomotor Exam   Head shaking Horizontal  Absent    Head Shaking Vertical Absent    Smooth Pursuits Saccades    Saccades Poor trajectory;Slow      Vestibulo-Ocular Reflex   VOR 2 Head and Object (x 2 viewing) normal       Positional Testing   Dix-Hallpike Dix-Hallpike Right;Dix-Hallpike Left      Positional Sensitivities   Nose to Right Knee Mild dizziness    Nose to Left Knee Mild dizziness              Objective measurements completed on examination: See above findings.       [facility] Adult PT Treatment/Exercise - 02/23/20 0001      Exercises   Exercises Knee/Hip      Knee/Hip Exercises: Seated   Long Arc Quad Both;10 reps    Other Seated Knee/Hip Exercises DF/PF x 10            Vestibular Treatment/Exercise - 02/23/20 0001      Vestibular Treatment/Exercise   Vestibular Treatment Provided Habituation;Gaze    Habituation Exercises Comment   seated nose to RT knee; LT knee x 10 each    Gaze Exercises Eye/Head Exercise Horizontal      Eye/Head Exercise Horizontal   Foot Position on floor     Reps 5                 PT Education - 02/23/20 1637    Education Details HEP    Person(s) Educated Patient    Methods Explanation;Handout;Verbal cues    Comprehension Verbalized understanding;Returned demonstration            PT Short Term Goals - 02/23/20 1649      PT SHORT TERM GOAL #1   Title PT to be I in HEP to decrease his sx of dizziness by 50%    Time 3    Period Weeks    Status New    Target Date 03/15/20      PT SHORT TERM GOAL #2   Title PT  LE and core strength to increase 1 grade to allow pt to be able to walk for 15 minutes without feeling fatique.    Time 3    Period Weeks    Status New      PT SHORT TERM GOAL #3   Title Pt cervical ROM rotation to increase by 10 degrees B to allow safer driving .    Time 3    Period Weeks    Status New             PT Long Term Goals - 02/23/20 1652      PT LONG TERM GOAL #1   Title Pt to be able to single leg stance on both legs  for 30 seconds to feel confident in walking without his cane.    Time 6    Period Weeks    Status New    Target Date 04/05/20      PT LONG TERM GOAL #2   Title PT to state that his dizziness has resolve 80%    Time 6    Period Weeks    Status New      PT LONG TERM GOAL #3   Title PT to demonstarate at least 4+/5 strength in knees and hips to be able to complete yard and carpentry work    Time 6    Period Weeks    Status New      PT LONG TERM GOAL #4   Title PT to be able to have 15 degrees increased cervical rotation for safer driving    Time 6    Period Weeks    Status New                  Plan - 02/23/20 1640    Clinical Impression Statement Thomas Adkins is an 79 yo male who comes to physical therapy with a request to treat central vertigo.  Upon examination Thomas Adkins has multiple problems.  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  He has been using a cane since this incident due to feeling like his legs are going to give out.  He went to therapy at [facility] as well as a chiropractor but did not recieve much relief.  He states about the same time he started feeling light headed whenever he goes from sit to stand.  He has had his orthostatic BP taken and this is not the problem.  He also states that his neck has had increased pain and decreased motion since the accident.  Examination demonstrates decreased strength in core and LE,  increase mm spasm in trap area B, decreased ROM, difficulty walking and most likely decreased balance although the therapist did not have time to test this at this session.  Thomas Adkins will benefit from skilled PT to address these issues and maximize Thomas Adkins functional ability.    Personal Factors and Comorbidities Past/Current Experience;Time since onset of injury/illness/exacerbation    Examination-Activity Limitations  Locomotion Level;Stand;Squat;Transfers    Examination-Participation Restrictions Cleaning;Community Activity;Shop;Yard Work    Merchant [employer] officer Evolving/Moderate complexity    Clinical Decision Making Moderate    Rehab Potential Good    PT Frequency 2x / week    PT Duration 6 weeks    PT Treatment/Interventions Gait training;Stair training;Functional mobility training;Therapeutic activities;Therapeutic exercise;Balance training;Neuromuscular re-education;Patient/family education;Manual techniques  PT Next Visit Plan Compled Dix Hal-pike manuever to ensure there is not BPPV occuring due to hx of fall, begin manual on neck and increasing cervical ROM, test balance, begin sit to stand for both strengthening and habituation, functional sqat.           Patient will benefit from skilled therapeutic intervention in order to improve the following deficits and impairments:  Abnormal gait, Decreased activity tolerance, Decreased balance, Decreased strength, Difficulty walking, Dizziness, Decreased range of motion, Increased muscle spasms, Postural dysfunction  Visit Diagnosis: Muscle weakness (generalized) - Plan: PT plan of care cert/re-cert  Stiffness of neck - Plan: PT plan of care cert/re-cert  Other abnormalities of gait and mobility - Plan: PT plan of care cert/re-cert  BPPV (benign paroxysmal positional vertigo), bilateral - Plan: PT plan of care cert/re-cert     Problem List Patient Active Problem List   Diagnosis Date Noted  . Chronic bilateral low back pain 05/24/2019  . Gastroesophageal reflux disease without esophagitis 05/24/2019  . 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 High blood pressure 06/23/2016  . Diabetes [facility]) 06/23/2016    Rayetta Humphrey, [place] CLT [phone] 02/23/2020, 5:01 PM  [facility] [address] [place], [place], [address] Phone: [phone]   Fax:  [phone]  Name: Thomas Adkins MRN: [id number] Date of Birth: 01-22-1941

## 2020-02-26 ENCOUNTER — Encounter (HOSPITAL_COMMUNITY): Payer: Self-pay | Admitting: Physical Therapy

## 2020-02-26 ENCOUNTER — Ambulatory Visit (HOSPITAL_COMMUNITY): Payer: Medicare HMO | Admitting: Physical Therapy

## 2020-02-26 ENCOUNTER — Other Ambulatory Visit: Payer: Self-pay

## 2020-02-26 DIAGNOSIS — M6281 Muscle weakness (generalized): Secondary | ICD-10-CM | POA: Diagnosis not present

## 2020-02-26 DIAGNOSIS — H8113 Benign paroxysmal vertigo, bilateral: Secondary | ICD-10-CM

## 2020-02-26 DIAGNOSIS — M436 Torticollis: Secondary | ICD-10-CM | POA: Diagnosis not present

## 2020-02-26 DIAGNOSIS — M1711 Unilateral primary osteoarthritis, right knee: Secondary | ICD-10-CM | POA: Diagnosis not present

## 2020-02-26 DIAGNOSIS — R42 Dizziness and giddiness: Secondary | ICD-10-CM | POA: Diagnosis not present

## 2020-02-26 DIAGNOSIS — R2689 Other abnormalities of gait and mobility: Secondary | ICD-10-CM | POA: Diagnosis not present

## 2020-02-26 NOTE — Therapy (Signed)
[place], [place], [address] Phone: [phone]   Fax:  [phone]  Physical Therapy Treatment  Patient Details  Name: Thomas Adkins MRN: [id number] Date of Birth: October 11, 1940 Referring Provider (PT): Jerene Bears   Encounter Date: 02/26/2020   PT End of Session - 02/26/20 0915    Visit Number 2    Number of Visits 12    Date for PT Re-Evaluation 04/05/20    Authorization Type [employer] requested 12    Progress Note Due on Visit 10    PT Start Time 0915    PT Stop Time 0955    PT Time Calculation (min) 40 min    Activity Tolerance Patient tolerated treatment well    Behavior During Therapy [facility] for tasks assessed/performed           Past Medical History:  Diagnosis Date  . Anxiety   . Arthritis   . Ataxia   . B12 deficiency   . Back ache    back problems  . Cataract   . Depression   . Diabetes ([place]) 06/23/2016   type 2  . Diabetic retinopathy ([place])   . Dizziness   . Fatigue   . GERD (gastroesophageal reflux disease)   . Hearing loss   . High blood pressure 06/23/2016  . High cholesterol 06/23/2016  . History of kidney stones   . Insomnia   . Kidney stone   . Lumbar radiculopathy   . Osteoarthritis   . Osteomyelitis ([place])    L spine  . Polyneuropathy   . PUD (peptic ulcer disease)   . Tremor     Past Surgical History:  Procedure Laterality Date  . CATARACT EXTRACTION     bilateral  . EXTRACORPOREAL SHOCK WAVE LITHOTRIPSY Left 11/29/2017   Procedure: LEFT EXTRACORPOREAL SHOCK WAVE LITHOTRIPSY (ESWL);  Surgeon: Franchot Gallo, MD;  Location: [facility];  Service: Urology;  Laterality: Left;  . EYE SURGERY    . IR LUMBAR DISC ASPIRATION W/IMG GUIDE  09/09/2017  . JOINT REPLACEMENT     toe 40 yrs ago    There were no vitals filed for this visit.   Subjective Assessment - 02/26/20 0926    Subjective States that his dizziness is about the same since his last session. States he is a little  nauseous now. States no side is worse in regards to left or right. States he feels better when he has something to hold onto.    Currently in Pain? No/denies              [facility] PT Assessment - 02/26/20 0001      Assessment   Medical Diagnosis central vertigo     Referring Provider (PT) Vyas Dhruv    Onset Date/Surgical Date 02/27/19    Next MD Visit 03/19/2020      Transfers   Five time sit to stand comments  24.5   no UE support, no symptoms.               Vestibular Assessment - 02/26/20 0001      Dix-Hallpike Right   Dix-Hallpike Right Duration neg    Dix-Hallpike Right Symptoms No nystagmus      Dix-Hallpike Left   Dix-Hallpike Left Duration neg    Dix-Hallpike Left Symptoms No nystagmus                    [facility] Adult PT Treatment/Exercise - 02/26/20 0001  Exercises   Exercises Neck      Neck Exercises: Supine   Cervical Rotation Both;10 reps   5" holds, 2 sets   Other Supine Exercise chin tucks 3x10- 5"      Manual Therapy   Manual Therapy Joint mobilization;Soft tissue mobilization    Manual therapy comments all manual interventions performed independently of other interventions.     Joint Mobilization AP of cervical spine B - focused on R - grade II, medial glides to cervical spine - performed bilaterally focused on L grade II     Soft tissue mobilization STM to B SCM and anterior scalenes - tolerated well                  PT Education - 02/26/20 0947    Education Details on epley manuver- prior demonstration, rationale and expectations. On current condition and how cervical spine can be contributing to curremt conditions.    Person(s) Educated Patient    Methods Explanation    Comprehension Verbalized understanding            PT Short Term Goals - 02/23/20 1649      PT SHORT TERM GOAL #1   Title PT to be I in HEP to decrease his sx of dizziness by 50%    Time 3    Period Weeks    Status New    Target Date 03/15/20       PT SHORT TERM GOAL #2   Title PT LE and core strength to increase 1 grade to allow pt to be able to walk for 15 minutes without feeling fatique.    Time 3    Period Weeks    Status New      PT SHORT TERM GOAL #3   Title Pt cervical ROM rotation to increase by 10 degrees B to allow safer driving .    Time 3    Period Weeks    Status New             PT Long Term Goals - 02/23/20 1652      PT LONG TERM GOAL #1   Title Pt to be able to single leg stance on both legs for 30 seconds to feel confident in walking without his cane.    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 Educated patient in STS for strengthening at home. Focused on manual work of cervical spine. Patient reported that nausea seemed to improve  with STM and joint mobs of anterior of cervical region.  Added cervical mobility exercises to HEP but patient would continue to benefit from manual mobilizations and soft tissue mobilization to neck with focus on anterior region to improve mobility but also rule out cervicogenic causes of nausea.    Personal Factors and Comorbidities Past/Current Experience;Time since onset of injury/illness/exacerbation    Examination-Activity Limitations Locomotion Level;Stand;Squat;Transfers    Examination-Participation Restrictions Cleaning;Community  Activity;Shop;Yard Work    Merchant [employer] officer Evolving/Moderate complexity    Rehab Potential Good    PT Frequency 2x / week    PT Duration 6 weeks    PT Treatment/Interventions Gait training;Stair training;Functional mobility training;Therapeutic activities;Therapeutic exercise;Balance training;Neuromuscular re-education;Patient/family education;Manual techniques    PT Next Visit Plan STM to anterior neck, Joint mobs of neck, increase cervical ROM (retraction, ROT, neutral position), test balance,    PT Home Exercise Plan 7/19 STS, cervical ROT and retraction supine           Patient will benefit from skilled therapeutic intervention in order to improve the following deficits and impairments:  Abnormal gait, Decreased activity tolerance, Decreased balance, Decreased strength, Difficulty walking, Dizziness, Decreased range of motion, Increased muscle spasms, Postural dysfunction  Visit Diagnosis: Muscle weakness (generalized)  Stiffness of neck  Other abnormalities of gait and mobility  BPPV (benign paroxysmal positional vertigo), bilateral     Problem List Patient Active Problem List   Diagnosis Date Noted  . Chronic bilateral low back pain 05/24/2019  . Gastroesophageal reflux disease without esophagitis 05/24/2019  . 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 High blood pressure 06/23/2016  . Diabetes ([place]) 06/23/2016    10:55 AM, 02/26/20 Jerene Pitch, DPT Physical Therapy with [facility]  [phone] office  [facility] [address] [place], [place], [address] Phone: [phone]   Fax:  [phone]  Name: Thomas Adkins MRN: [id number] Date of Birth: [birth year]

## 2020-02-29 ENCOUNTER — Ambulatory Visit (HOSPITAL_COMMUNITY): Payer: Medicare HMO | Admitting: Physical Therapy

## 2020-02-29 ENCOUNTER — Other Ambulatory Visit: Payer: Self-pay

## 2020-02-29 DIAGNOSIS — R2689 Other abnormalities of gait and mobility: Secondary | ICD-10-CM

## 2020-02-29 DIAGNOSIS — M436 Torticollis: Secondary | ICD-10-CM | POA: Diagnosis not present

## 2020-02-29 DIAGNOSIS — M6281 Muscle weakness (generalized): Secondary | ICD-10-CM | POA: Diagnosis not present

## 2020-02-29 DIAGNOSIS — R42 Dizziness and giddiness: Secondary | ICD-10-CM | POA: Diagnosis not present

## 2020-02-29 DIAGNOSIS — M1712 Unilateral primary osteoarthritis, left knee: Secondary | ICD-10-CM | POA: Diagnosis not present

## 2020-02-29 DIAGNOSIS — H8113 Benign paroxysmal vertigo, bilateral: Secondary | ICD-10-CM | POA: Diagnosis not present

## 2020-02-29 NOTE — Therapy (Signed)
[place], [place], [address] Phone: [phone]   Fax:  [phone]  Physical Therapy Treatment  Patient Details  Name: Thomas Adkins MRN: [id number] Date of Birth: [birth year] Referring Provider (PT): Jerene Bears   Encounter Date: 02/29/2020   PT End of Session - 02/29/20 1210    Visit Number 3    Number of Visits 12    Date for PT Re-Evaluation 04/05/20    Authorization Type [employer] requested 12    Progress Note Due on Visit 10    PT Start Time 1045    PT Stop Time 1130    PT Time Calculation (min) 45 min    Activity Tolerance Patient tolerated treatment well    Behavior During Therapy [facility] for tasks assessed/performed           Past Medical History:  Diagnosis Date  . Anxiety   . Arthritis   . Ataxia   . B12 deficiency   . Back ache    back problems  . Cataract   . Depression   . Diabetes ([place]) 06/23/2016   type 2  . Diabetic retinopathy ([place])   . Dizziness   . Fatigue   . GERD (gastroesophageal reflux disease)   . Hearing loss   . High blood pressure 06/23/2016  . High cholesterol 06/23/2016  . History of kidney stones   . Insomnia   . Kidney stone   . Lumbar radiculopathy   . Osteoarthritis   . Osteomyelitis ([place])    L spine  . Polyneuropathy   . PUD (peptic ulcer disease)   . Tremor     Past Surgical History:  Procedure Laterality Date  . CATARACT EXTRACTION     bilateral  . EXTRACORPOREAL SHOCK WAVE LITHOTRIPSY Left 11/29/2017   Procedure: LEFT EXTRACORPOREAL SHOCK WAVE LITHOTRIPSY (ESWL);  Surgeon: Franchot Gallo, MD;  [facility];  Service: Urology;  Laterality: Left;  . EYE SURGERY    . IR LUMBAR DISC ASPIRATION W/IMG GUIDE  09/09/2017  . JOINT REPLACEMENT     toe 40 yrs ago    There were no vitals filed for this visit.   Subjective Assessment - 02/29/20 1058    Subjective Pt states that he has been doing the exercises; there is not  a lot of change.    Currently  in Pain? Yes    Pain Score 8     Pain Location Knee    Pain Orientation Right;Left    Pain Descriptors / Indicators Aching    Pain Type Chronic pain    Pain Onset More than a month ago    Pain Frequency --   wt bearing                   [facility] Adult PT Treatment/Exercise - 02/29/20 0001      Transfers   Five time sit to stand comments  --      Exercises   Exercises Neck      Neck Exercises: Supine   Cervical Rotation Both;10 reps   5" holds, 2 sets   Other Supine Exercise chin tucks 3x10- 5"      Manual Therapy   Manual Therapy Joint mobilization;Soft tissue mobilization    Manual therapy comments all manual interventions performed independently of other interventions.     Joint Mobilization AP of cervical spine B - focused on R - grade II, medial glides to cervical spine - performed bilaterally focused on L  grade II     Soft tissue mobilization STM to B SCM and anterior scalenes - tolerated well               Balance Exercises - 02/29/20 0001      Balance Exercises: Standing   Standing Eyes Opened Narrow base of support (BOS);5 reps   Both UE into flexion    Tandem Stance Eyes open;1 rep;10 secs   semi tandem 5x turning head Rt to LE / 5x nods   SLS Eyes open;3 reps;20 secs   one hand hold    Sidestepping 2 reps    Marching 10 reps    Heel Raises 10 reps    Sit to Stand Standard surface;Upper extremity support   x10            PT Education - 02/29/20 1210    Education Details HEP    Person(s) Educated Patient    Methods Explanation    Comprehension Verbalized understanding            PT Short Term Goals - 02/29/20 1214      PT SHORT TERM GOAL #1   Title PT to be I in HEP to decrease his sx of dizziness by 50%    Time 3    Period Weeks    Status On-going    Target Date 03/15/20      PT SHORT TERM GOAL #2   Title PT LE and core strength to increase 1 grade to allow pt to be able to walk for 15 minutes without feeling fatique.    Time 3      Period Weeks    Status On-going      PT SHORT TERM GOAL #3   Title Pt cervical ROM rotation to increase by 10 degrees B to allow safer driving .    Time 3    Period Weeks    Status Achieved             PT Long Term Goals - 02/29/20 1214      PT LONG TERM GOAL #1   Title Pt to be able to single leg stance on both legs for 30 seconds to feel confident in walking without his cane.    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  Pt is challenged by balance activities but does well with verbal feedback.  Noted increased cervical ROM with manual with moderate spasms and continued limited motion.  Added to Pt HEP>    Personal Factors and Comorbidities Past/Current  Experience;Time since onset of injury/illness/exacerbation    Examination-Activity Limitations Locomotion Level;Stand;Squat;Transfers    Examination-Participation Restrictions Cleaning;Community Activity;Shop;Yard Work    Merchant [employer] officer Evolving/Moderate complexity    Rehab Potential Good    PT Frequency 2x / week    PT Duration 6 weeks    PT Treatment/Interventions Gait training;Stair training;Functional mobility training;Therapeutic activities;Therapeutic exercise;Balance training;Neuromuscular re-education;Patient/family education;Manual techniques    PT Next Visit Plan Continue with balance and  manual for cervical region    PT Home Exercise Plan 7/19 STS, cervical ROT and retraction supine; 7/22:  heelraises and single leg stance with one hand support           Patient will benefit from skilled therapeutic intervention in order to improve the following deficits and impairments:  Abnormal gait, Decreased activity tolerance, Decreased balance, Decreased strength, Difficulty walking, Dizziness, Decreased range of motion, Increased muscle spasms, Postural dysfunction  Visit Diagnosis: Muscle weakness (generalized)  Stiffness of neck  Other abnormalities of gait and mobility  Dizziness and giddiness     Problem List Patient Active Problem List   Diagnosis Date Noted  . Chronic bilateral low back pain 05/24/2019  . Gastroesophageal reflux disease without esophagitis 05/24/2019  . 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 High blood pressure 06/23/2016  . Diabetes [facility]) 06/23/2016    Rayetta Humphrey, PT CLT [phone] 02/29/2020, 12:15 PM  [place] [address] [place], [place], [address] Phone: [phone]   Fax:  [phone]  Name: Thomas Adkins MRN: [id number] Date of Birth: [birth year]

## 2020-03-04 ENCOUNTER — Telehealth (HOSPITAL_COMMUNITY): Payer: Self-pay | Admitting: Physical Therapy

## 2020-03-04 DIAGNOSIS — M1711 Unilateral primary osteoarthritis, right knee: Secondary | ICD-10-CM | POA: Diagnosis not present

## 2020-03-04 NOTE — Telephone Encounter (Signed)
Mcarthur Rossetti Josem Kaufmann: [phone] (exp. 02/20/20 to 03/21/20) order sent to GI. They will reach out to the patient to schedule.

## 2020-03-04 NOTE — Telephone Encounter (Signed)
pt cancelled the remainder of his appts because he has to have surgery

## 2020-03-05 ENCOUNTER — Encounter (HOSPITAL_COMMUNITY): Payer: Self-pay | Admitting: Physical Therapy

## 2020-03-05 NOTE — Therapy (Unsigned)
[name], [place], [address] Phone: [phone]   Fax:  [phone]  Patient Details  Name: Thomas Adkins MRN: [id number] Date of Birth: November 12, 1940 Referring Provider:  No ref. provider found  Encounter Date: 03/05/2020  PHYSICAL THERAPY DISCHARGE SUMMARY  Visits from Start of Care: 3  Current functional level related to goals / functional outcomes: same   Remaining deficits: Decreased activity and dizziness Education / Equipment: HEP Plan: Patient agrees to discharge.  Patient goals were not met. Patient is being discharged due to a change in medical status.  ?????    PT cancelled due to having to have surgery Rayetta Humphrey, PT CLT [phone] 03/05/2020, 8:09 AM  [place], [place], [address] Phone: [phone]   Fax:  [phone]

## 2020-03-08 DIAGNOSIS — M1712 Unilateral primary osteoarthritis, left knee: Secondary | ICD-10-CM | POA: Diagnosis not present

## 2020-03-08 DIAGNOSIS — I10 Essential (primary) hypertension: Secondary | ICD-10-CM | POA: Diagnosis not present

## 2020-03-11 DIAGNOSIS — M1711 Unilateral primary osteoarthritis, right knee: Secondary | ICD-10-CM | POA: Diagnosis not present

## 2020-03-12 ENCOUNTER — Encounter (HOSPITAL_COMMUNITY): Payer: Medicare HMO | Admitting: Physical Therapy

## 2020-03-13 DIAGNOSIS — I10 Essential (primary) hypertension: Secondary | ICD-10-CM | POA: Diagnosis not present

## 2020-03-13 DIAGNOSIS — E1142 Type 2 diabetes mellitus with diabetic polyneuropathy: Secondary | ICD-10-CM | POA: Diagnosis not present

## 2020-03-13 DIAGNOSIS — Z299 Encounter for prophylactic measures, unspecified: Secondary | ICD-10-CM | POA: Diagnosis not present

## 2020-03-13 DIAGNOSIS — R5383 Other fatigue: Secondary | ICD-10-CM | POA: Diagnosis not present

## 2020-03-13 DIAGNOSIS — E1165 Type 2 diabetes mellitus with hyperglycemia: Secondary | ICD-10-CM | POA: Diagnosis not present

## 2020-03-14 ENCOUNTER — Encounter (HOSPITAL_COMMUNITY): Payer: Medicare HMO | Admitting: Physical Therapy

## 2020-03-14 DIAGNOSIS — M1712 Unilateral primary osteoarthritis, left knee: Secondary | ICD-10-CM | POA: Diagnosis not present

## 2020-03-16 ENCOUNTER — Inpatient Hospital Stay (HOSPITAL_COMMUNITY)
Admission: EM | Admit: 2020-03-16 | Discharge: 2020-03-17 | DRG: 065 | Disposition: A | Discharge status: Home Health | Payer: Medicare HMO | Attending: Family Medicine | Admitting: Family Medicine

## 2020-03-16 ENCOUNTER — Other Ambulatory Visit: Payer: Self-pay

## 2020-03-16 ENCOUNTER — Emergency Department (HOSPITAL_COMMUNITY): Payer: Medicare HMO

## 2020-03-16 ENCOUNTER — Ambulatory Visit
Admission: RE | Admit: 2020-03-16 | Discharge: 2020-03-16 | Disposition: A | Discharge status: Home / Self Care | Payer: Medicare HMO | Source: Ambulatory Visit | Attending: Diagnostic Neuroimaging | Admitting: Diagnostic Neuroimaging

## 2020-03-16 ENCOUNTER — Inpatient Hospital Stay (HOSPITAL_COMMUNITY): Payer: Medicare HMO

## 2020-03-16 ENCOUNTER — Telehealth: Payer: Self-pay | Admitting: Neurology

## 2020-03-16 ENCOUNTER — Encounter (HOSPITAL_COMMUNITY): Payer: Self-pay | Admitting: Emergency Medicine

## 2020-03-16 DIAGNOSIS — Z809 Family history of malignant neoplasm, unspecified: Secondary | ICD-10-CM

## 2020-03-16 DIAGNOSIS — Z8711 Personal history of peptic ulcer disease: Secondary | ICD-10-CM | POA: Diagnosis not present

## 2020-03-16 DIAGNOSIS — R937 Abnormal findings on diagnostic imaging of other parts of musculoskeletal system: Secondary | ICD-10-CM

## 2020-03-16 DIAGNOSIS — Z8249 Family history of ischemic heart disease and other diseases of the circulatory system: Secondary | ICD-10-CM | POA: Diagnosis not present

## 2020-03-16 DIAGNOSIS — Z20822 Contact with and (suspected) exposure to covid-19: Secondary | ICD-10-CM | POA: Diagnosis not present

## 2020-03-16 DIAGNOSIS — H814 Vertigo of central origin: Secondary | ICD-10-CM

## 2020-03-16 DIAGNOSIS — E119 Type 2 diabetes mellitus without complications: Secondary | ICD-10-CM | POA: Diagnosis not present

## 2020-03-16 DIAGNOSIS — Z87891 Personal history of nicotine dependence: Secondary | ICD-10-CM | POA: Diagnosis not present

## 2020-03-16 DIAGNOSIS — E11319 Type 2 diabetes mellitus with unspecified diabetic retinopathy without macular edema: Secondary | ICD-10-CM | POA: Diagnosis present

## 2020-03-16 DIAGNOSIS — R29702 NIHSS score 2: Secondary | ICD-10-CM | POA: Diagnosis not present

## 2020-03-16 DIAGNOSIS — E1151 Type 2 diabetes mellitus with diabetic peripheral angiopathy without gangrene: Secondary | ICD-10-CM | POA: Diagnosis present

## 2020-03-16 DIAGNOSIS — D649 Anemia, unspecified: Secondary | ICD-10-CM | POA: Diagnosis present

## 2020-03-16 DIAGNOSIS — I63233 Cerebral infarction due to unspecified occlusion or stenosis of bilateral carotid arteries: Secondary | ICD-10-CM | POA: Diagnosis not present

## 2020-03-16 DIAGNOSIS — H9190 Unspecified hearing loss, unspecified ear: Secondary | ICD-10-CM | POA: Diagnosis present

## 2020-03-16 DIAGNOSIS — R110 Nausea: Secondary | ICD-10-CM | POA: Diagnosis not present

## 2020-03-16 DIAGNOSIS — I10 Essential (primary) hypertension: Secondary | ICD-10-CM | POA: Diagnosis present

## 2020-03-16 DIAGNOSIS — I6389 Other cerebral infarction: Secondary | ICD-10-CM | POA: Diagnosis not present

## 2020-03-16 DIAGNOSIS — Z7984 Long term (current) use of oral hypoglycemic drugs: Secondary | ICD-10-CM | POA: Diagnosis not present

## 2020-03-16 DIAGNOSIS — K219 Gastro-esophageal reflux disease without esophagitis: Secondary | ICD-10-CM | POA: Diagnosis not present

## 2020-03-16 DIAGNOSIS — Z79899 Other long term (current) drug therapy: Secondary | ICD-10-CM | POA: Diagnosis not present

## 2020-03-16 DIAGNOSIS — E1140 Type 2 diabetes mellitus with diabetic neuropathy, unspecified: Secondary | ICD-10-CM | POA: Diagnosis present

## 2020-03-16 DIAGNOSIS — R9431 Abnormal electrocardiogram [ECG] [EKG]: Secondary | ICD-10-CM | POA: Diagnosis not present

## 2020-03-16 DIAGNOSIS — Z7982 Long term (current) use of aspirin: Secondary | ICD-10-CM

## 2020-03-16 DIAGNOSIS — I63412 Cerebral infarction due to embolism of left middle cerebral artery: Secondary | ICD-10-CM | POA: Diagnosis not present

## 2020-03-16 DIAGNOSIS — Z87442 Personal history of urinary calculi: Secondary | ICD-10-CM | POA: Diagnosis not present

## 2020-03-16 DIAGNOSIS — N179 Acute kidney failure, unspecified: Secondary | ICD-10-CM | POA: Diagnosis not present

## 2020-03-16 DIAGNOSIS — I63332 Cerebral infarction due to thrombosis of left posterior cerebral artery: Secondary | ICD-10-CM | POA: Diagnosis not present

## 2020-03-16 DIAGNOSIS — I639 Cerebral infarction, unspecified: Principal | ICD-10-CM | POA: Diagnosis present

## 2020-03-16 DIAGNOSIS — G903 Multi-system degeneration of the autonomic nervous system: Secondary | ICD-10-CM | POA: Diagnosis not present

## 2020-03-16 DIAGNOSIS — I6381 Other cerebral infarction due to occlusion or stenosis of small artery: Secondary | ICD-10-CM

## 2020-03-16 LAB — URINALYSIS, ROUTINE W REFLEX MICROSCOPIC
Bilirubin Urine: NEGATIVE
Glucose, UA: 150 mg/dL — AB
Hgb urine dipstick: NEGATIVE
Ketones, ur: NEGATIVE mg/dL
Leukocytes,Ua: NEGATIVE
Nitrite: NEGATIVE
Protein, ur: NEGATIVE mg/dL
Specific Gravity, Urine: 1.019 (ref 1.005–1.030)
pH: 5 (ref 5.0–8.0)

## 2020-03-16 LAB — COMPREHENSIVE METABOLIC PANEL
ALT: 32 U/L (ref 0–44)
AST: 21 U/L (ref 15–41)
Albumin: 3.2 g/dL — ABNORMAL LOW (ref 3.5–5.0)
Alkaline Phosphatase: 116 U/L (ref 38–126)
Anion gap: 8 (ref 5–15)
BUN: 26 mg/dL — ABNORMAL HIGH (ref 8–23)
CO2: 25 mmol/L (ref 22–32)
Calcium: 8.6 mg/dL — ABNORMAL LOW (ref 8.9–10.3)
Chloride: 103 mmol/L (ref 98–111)
Creatinine, Ser: 1.24 mg/dL (ref 0.61–1.24)
GFR calc Af Amer: >60 mL/min (ref >60)
GFR calc non Af Amer: 55 mL/min — ABNORMAL LOW (ref >60)
Glucose, Bld: 194 mg/dL — ABNORMAL HIGH (ref 70–99)
Potassium: 4.3 mmol/L (ref 3.5–5.1)
Sodium: 136 mmol/L (ref 135–145)
Total Bilirubin: 0.5 mg/dL (ref 0.3–1.2)
Total Protein: 6.4 g/dL — ABNORMAL LOW (ref 6.5–8.1)

## 2020-03-16 LAB — CBC
HCT: 29.3 % — ABNORMAL LOW (ref 39.0–52.0)
Hemoglobin: 9 g/dL — ABNORMAL LOW (ref 13.0–17.0)
MCH: 24.9 pg — ABNORMAL LOW (ref 26.0–34.0)
MCHC: 30.7 g/dL (ref 30.0–36.0)
MCV: 81.2 fL (ref 80.0–100.0)
Platelets: 281 10*3/uL (ref 150–400)
RBC: 3.61 MIL/uL — ABNORMAL LOW (ref 4.22–5.81)
RDW: 16.9 % — ABNORMAL HIGH (ref 11.5–15.5)
WBC: 9.4 10*3/uL (ref 4.0–10.5)
nRBC: 0 % (ref 0.0–0.2)

## 2020-03-16 LAB — DIFFERENTIAL
Abs Immature Granulocytes: 0.15 10*3/uL — ABNORMAL HIGH (ref 0.00–0.07)
Basophils Absolute: 0.1 10*3/uL (ref 0.0–0.1)
Basophils Relative: 1 %
Eosinophils Absolute: 0.3 10*3/uL (ref 0.0–0.5)
Eosinophils Relative: 3 %
Immature Granulocytes: 2 %
Lymphocytes Relative: 21 %
Lymphs Abs: 2 10*3/uL (ref 0.7–4.0)
Monocytes Absolute: 0.9 10*3/uL (ref 0.1–1.0)
Monocytes Relative: 10 %
Neutro Abs: 6.1 10*3/uL (ref 1.7–7.7)
Neutrophils Relative %: 63 %

## 2020-03-16 LAB — HEMOGLOBIN A1C
Hgb A1c MFr Bld: 8.7 % — ABNORMAL HIGH (ref 4.8–5.6)
Mean Plasma Glucose: 202.99 mg/dL

## 2020-03-16 LAB — RAPID URINE DRUG SCREEN, HOSP PERFORMED
Amphetamines: NOT DETECTED
Barbiturates: NOT DETECTED
Benzodiazepines: POSITIVE — AB
Cocaine: NOT DETECTED
Opiates: NOT DETECTED
Tetrahydrocannabinol: NOT DETECTED

## 2020-03-16 LAB — APTT: aPTT: 30 seconds (ref 24–36)

## 2020-03-16 LAB — PROTIME-INR
INR: 1 (ref 0.8–1.2)
Prothrombin Time: 12.9 seconds (ref 11.4–15.2)

## 2020-03-16 LAB — SARS CORONAVIRUS 2 BY RT PCR (HOSPITAL ORDER, PERFORMED IN ~~LOC~~ HOSPITAL LAB): SARS Coronavirus 2: NEGATIVE

## 2020-03-16 MED ORDER — SIMVASTATIN 20 MG PO TABS
20.0000 mg | ORAL_TABLET | Freq: Every day | ORAL | Status: DC
  Administered 2020-03-17: 20 mg via ORAL
  Filled 2020-03-16: qty 1

## 2020-03-16 MED ORDER — METOCLOPRAMIDE HCL 10 MG PO TABS: 5.0000 mg | ORAL_TABLET | Freq: Two times a day (BID) | ORAL | Status: DC | PRN

## 2020-03-16 MED ORDER — ACETAMINOPHEN 325 MG PO TABS: 650.0000 mg | ORAL_TABLET | ORAL | Status: DC | PRN

## 2020-03-16 MED ORDER — AMLODIPINE BESYLATE 2.5 MG PO TABS
2.5000 mg | ORAL_TABLET | Freq: Every evening | ORAL | Status: DC
  Administered 2020-03-17 (×2): 2.5 mg via ORAL
  Filled 2020-03-16 (×2): qty 1

## 2020-03-16 MED ORDER — TRAMADOL HCL 50 MG PO TABS: 50.0000 mg | ORAL_TABLET | Freq: Four times a day (QID) | ORAL | Status: DC | PRN

## 2020-03-16 MED ORDER — NAPROXEN SODIUM 220 MG PO TABS: 440.0000 mg | ORAL_TABLET | Freq: Two times a day (BID) | ORAL | Status: DC | PRN

## 2020-03-16 MED ORDER — CLOPIDOGREL BISULFATE 75 MG PO TABS
75.0000 mg | ORAL_TABLET | Freq: Every day | ORAL | Status: DC
  Administered 2020-03-17 (×2): 75 mg via ORAL
  Filled 2020-03-16 (×3): qty 1

## 2020-03-16 MED ORDER — ASPIRIN 300 MG RE SUPP: 300.0000 mg | Freq: Every day | RECTAL | Status: DC

## 2020-03-16 MED ORDER — ACETAMINOPHEN 160 MG/5ML PO SOLN: 650.0000 mg | ORAL | Status: DC | PRN

## 2020-03-16 MED ORDER — FERROUS GLUCONATE 324 (38 FE) MG PO TABS
324.0000 mg | ORAL_TABLET | Freq: Every day | ORAL | Status: DC
  Administered 2020-03-17: 324 mg via ORAL
  Filled 2020-03-16 (×2): qty 1

## 2020-03-16 MED ORDER — GADOBENATE DIMEGLUMINE 529 MG/ML IV SOLN
15.0000 mL | Freq: Once | INTRAVENOUS | Status: AC | PRN
  Administered 2020-03-16: 15 mL via INTRAVENOUS

## 2020-03-16 MED ORDER — ACETAMINOPHEN 650 MG RE SUPP: 650.0000 mg | RECTAL | Status: DC | PRN

## 2020-03-16 MED ORDER — STROKE: EARLY STAGES OF RECOVERY BOOK
Freq: Once | Status: DC
  Filled 2020-03-16: qty 1

## 2020-03-16 MED ORDER — PANTOPRAZOLE SODIUM 40 MG PO TBEC
40.0000 mg | DELAYED_RELEASE_TABLET | Freq: Every day | ORAL | Status: DC
  Administered 2020-03-17 (×2): 40 mg via ORAL
  Filled 2020-03-16 (×2): qty 1

## 2020-03-16 MED ORDER — SCOPOLAMINE 1 MG/3DAYS TD PT72: 1.5000 mg | MEDICATED_PATCH | TRANSDERMAL | Status: DC

## 2020-03-16 MED ORDER — INSULIN ASPART 100 UNIT/ML ~~LOC~~ SOLN
0.0000 [IU] | Freq: Three times a day (TID) | SUBCUTANEOUS | Status: DC
  Administered 2020-03-17 (×2): 5 [IU] via SUBCUTANEOUS
  Administered 2020-03-17: 3 [IU] via SUBCUTANEOUS
  Filled 2020-03-16: qty 0.15

## 2020-03-16 MED ORDER — SUCRALFATE 1 G PO TABS: 1.0000 g | ORAL_TABLET | Freq: Two times a day (BID) | ORAL | Status: DC | PRN

## 2020-03-16 MED ORDER — IRBESARTAN 150 MG PO TABS
150.0000 mg | ORAL_TABLET | Freq: Every day | ORAL | Status: DC
  Administered 2020-03-17: 150 mg via ORAL
  Filled 2020-03-16 (×2): qty 1

## 2020-03-16 MED ORDER — ASPIRIN 325 MG PO TABS
325.0000 mg | ORAL_TABLET | Freq: Every day | ORAL | Status: DC
  Administered 2020-03-17: 325 mg via ORAL
  Filled 2020-03-16: qty 1

## 2020-03-16 MED ORDER — SENNOSIDES-DOCUSATE SODIUM 8.6-50 MG PO TABS: 1.0000 | ORAL_TABLET | Freq: Every evening | ORAL | Status: DC | PRN

## 2020-03-16 MED ORDER — ENOXAPARIN SODIUM 40 MG/0.4ML ~~LOC~~ SOLN
40.0000 mg | SUBCUTANEOUS | Status: DC
  Administered 2020-03-17: 40 mg via SUBCUTANEOUS
  Filled 2020-03-16: qty 0.4

## 2020-03-16 MED ORDER — HYDRALAZINE HCL 10 MG PO TABS: 10.0000 mg | ORAL_TABLET | Freq: Four times a day (QID) | ORAL | Status: DC | PRN

## 2020-03-16 MED ORDER — SODIUM CHLORIDE 0.9 % IV SOLN: INTRAVENOUS | Status: AC

## 2020-03-16 MED ORDER — IOHEXOL 350 MG/ML SOLN
100.0000 mL | Freq: Once | INTRAVENOUS | Status: AC | PRN
  Administered 2020-03-16: 100 mL via INTRAVENOUS

## 2020-03-16 NOTE — H&P (Signed)
History and Physical    Thomas Adkins CBS:[id number] DOB: 06/14/1941 DOA: 03/16/2020  PCP: Glenda Chroman, MD (Confirm with patient/family/NH records and if not entered, this has to be entered at [facility] point of entry) Patient coming from: home  I have personally briefly reviewed patient's old medical records in [place]  Chief Complaint: dizziness; sent by Neuro on call 2/2 MRI evidence of acute stroke  HPI: Thomas Adkins is a 79 y.o. male with medical history significant of HTN, DM who wa seen 02/19/20 by neuro for dizziness when standing. 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 A work was initiated including MRI brain which was done on the day of admission. 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 Dr. Jannifer Franklin, on call for neuro, advised the patient to go to ED for admission for stroke workup.   ED Course: Afebrile, BP 162/ 106. Lab revealed glucose of 194, Cr 1.24 with prior Cr  March '19 1.02; Hgb 9.0. [facility] called to admit patient to [facility] for stroke workup.   Review of Systems: As per HPI otherwise 10 point review of systems negative. Admits to tingling intermittently arms and legs which is positional. Admits to very minimal change in swallow.    Past Medical History:  Diagnosis Date  . Anxiety   . Arthritis   . Ataxia   . B12 deficiency   . Back ache    back problems  . Cataract   . Depression   . Diabetes ([place]) 06/23/2016   type 2  . Diabetic retinopathy ([place])   . Dizziness   . Fatigue   . GERD (gastroesophageal reflux disease)   . Hearing loss   . High blood pressure 06/23/2016  . High cholesterol 06/23/2016  . History of kidney stones   . Insomnia   . Kidney stone   . Lumbar radiculopathy   . Osteoarthritis   . Osteomyelitis ([place])    L spine  . Polyneuropathy   . PUD (peptic ulcer disease)   . Tremor     Past Surgical History:  Procedure Laterality Date  . CATARACT EXTRACTION     bilateral  . EXTRACORPOREAL SHOCK WAVE  LITHOTRIPSY Left 11/29/2017   Procedure: LEFT EXTRACORPOREAL SHOCK WAVE LITHOTRIPSY (ESWL);  Surgeon: Franchot Gallo, MD;  Location: [facility] ORS;  Service: Urology;  Laterality: Left;  . EYE SURGERY    . IR LUMBAR DISC ASPIRATION W/IMG GUIDE  09/09/2017  . JOINT REPLACEMENT     toe 40 yrs ago    Soc Hx- married 27 years; 4 children, 6 grandchildren, 7 great-grandchildren. 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 Lives with his wife. I-ADLs. Son(s) help with yardwork.    reports that he has quit smoking. He has never used smokeless tobacco. He reports that he does not drink alcohol and does not use drugs.  Allergies  Allergen Reactions  . Bactrim [Sulfamethoxazole-Trimethoprim] Other (See Comments)    Fatigue, dry mouth  . Hydrocodone Nausea And Vomiting  . Januvia [Sitagliptin] Other (See Comments)    fatigue  . Metformin And Related Nausea And Vomiting and Other (See Comments)    headache  . Norvasc [Amlodipine] Other (See Comments)    edema  . Primidone Nausea Only    Family History  Problem Relation Age of Onset  . Cancer Mother   . Heart attack Father    )  Prior to Admission medications   Medication Sig Start Date End  Date Taking? Authorizing Provider  amLODipine (NORVASC) 5 MG tablet Take 2.5 mg by mouth every evening.  01/23/20  Yes [provider]  aspirin 81 MG tablet Take 81 mg by mouth daily.    Yes [provider]  candesartan (ATACAND) 16 MG tablet Take 8 mg by mouth daily.  11/01/19  Yes [provider]  cetirizine (ZYRTEC) 10 MG tablet Take 10 mg by mouth daily as needed for allergies or rhinitis.  11/06/19  Yes [provider]  diclofenac Sodium (VOLTAREN) 1 % GEL Apply 2 g topically 2 (two) times daily as needed (pain).  09/14/19  Yes [provider]  ferrous gluconate (FERGON) 324 MG tablet Take 324 mg by mouth daily with breakfast.   Yes [provider]    meclizine (ANTIVERT) 25 MG tablet Take 25 mg by mouth daily as needed for dizziness.  01/31/20  Yes [provider]  metoCLOPramide (REGLAN) 5 MG tablet Take 5 mg by mouth 2 (two) times daily as needed for nausea or vomiting.  01/22/20  Yes [provider]  Multiple Vitamins-Minerals (PRESERVISION AREDS 2 PO) Take 2 tablets by mouth daily.   Yes [provider]  naproxen sodium (ALEVE) 220 MG tablet Take 440 mg by mouth 2 (two) times daily as needed (pain).    Yes [provider]  omeprazole (PRILOSEC) 20 MG capsule Take 20 mg by mouth daily.   Yes [provider]  scopolamine (TRANSDERM-SCOP) 1 MG/3DAYS Place 1 mg onto the skin every 3 (three) days.  02/06/20  Yes [provider]  simvastatin (ZOCOR) 20 MG tablet Take 20 mg by mouth daily.   Yes [provider]  sucralfate (CARAFATE) 1 g tablet Take 1 g by mouth 2 (two) times daily as needed (GERD, heartburn).  11/05/19  Yes [provider]  traMADol (ULTRAM) 50 MG tablet Take 50 mg by mouth every 6 (six) hours as needed for moderate pain or severe pain.    Yes [provider]  losartan (COZAAR) 50 MG tablet Take one BID Patient not taking: Reported on 02/19/2020 05/24/19   Maryruth Hancock, MD    Physical Exam: Vitals:   03/16/20 1731 03/16/20 1748 03/16/20 1816 03/16/20 1847  BP: (!) 142/72  (!) 115/100 (!) 162/106  Pulse: 69  71 74  Resp: (!) 23  19 14   Temp: 98.1 F (36.7 C)     TempSrc: Oral     SpO2: 100%  99% 100%  Weight:  75.8 kg    Height:  5\' 8"  (1.727 m)      Constitutional: NAD, calm, comfortable Vitals:   03/16/20 1731 03/16/20 1748 03/16/20 1816 03/16/20 1847  BP: (!) 142/72  (!) 115/100 (!) 162/106  Pulse: 69  71 74  Resp: (!) 23  19 14   Temp: 98.1 F (36.7 C)     TempSrc: Oral     SpO2: 100%  99% 100%  Weight:  75.8 kg    Height:  5\' 8"  (1.727 m)     General - WNWD man in no distress Eyes: PERRL, lids and conjunctivae normal ENMT:  Mucous membranes are moist. Posterior pharynx clear of any exudate or lesions.Edentulous, full-dentures in place.  Neck: normal, supple, no masses, no thyromegaly Respiratory: clear to auscultation bilaterally, no wheezing, no crackles. Normal respiratory effort. No accessory muscle use.  Cardiovascular: Regular rate and rhythm, no murmurs / rubs / gallops. No extremity edema. 2+ pedal pulses. No carotid bruits.  Abdomen: no tenderness, no  masses palpated. No hepatosplenomegaly. Bowel sounds positive.  Musculoskeletal: no clubbing / cyanosis. No joint deformity upper and lower extremities. Good ROM, no contractures. Normal muscle tone.  Skin: no rashes, lesions, ulcers. No induration Neurologic: CN normal  facial symmetry and movement, PERRLA, EOMI, nl shoulder shrug. MS- 5/5 proximal/distal UE, nl grip strength, nl LE strength. DTRs 2+ symmetrical. Sensation well preserved to light touch. Psychiatric: Normal judgment and insight. Alert and oriented x 3. Normal mood.     Labs on Admission: I have personally reviewed following labs and imaging studies  CBC: Recent Labs  Lab 03/16/20 1739  WBC 9.4  NEUTROABS 6.1  HGB 9.0*  HCT 29.3*  MCV 81.2  PLT 350   Basic Metabolic Panel: Recent Labs  Lab 03/16/20 1739  NA 136  K 4.3  CL 103  CO2 25  GLUCOSE 194*  BUN 26*  CREATININE 1.24  CALCIUM 8.6*   GFR: Estimated Creatinine Clearance: 46 mL/min (by C-G formula based on SCr of 1.24 mg/dL). Liver Function Tests: Recent Labs  Lab 03/16/20 1739  AST 21  ALT 32  ALKPHOS 116  BILITOT 0.5  PROT 6.4*  ALBUMIN 3.2*   No results for input(s): LIPASE, AMYLASE in the last 168 hours. No results for input(s): AMMONIA in the last 168 hours. Coagulation Profile: Recent Labs  Lab 03/16/20 1739  INR 1.0   Cardiac Enzymes: No results for input(s): CKTOTAL, CKMB, CKMBINDEX, TROPONINI in the last 168 hours. BNP (last 3 results) No results for input(s): PROBNP in the last 8760  hours. HbA1C: No results for input(s): HGBA1C in the last 72 hours. CBG: No results for input(s): GLUCAP in the last 168 hours. Lipid Profile: No results for input(s): CHOL, HDL, LDLCALC, TRIG, CHOLHDL, LDLDIRECT in the last 72 hours. Thyroid Function Tests: No results for input(s): TSH, T4TOTAL, FREET4, T3FREE, THYROIDAB in the last 72 hours. Anemia Panel: No results for input(s): VITAMINB12, FOLATE, FERRITIN, TIBC, IRON, RETICCTPCT in the last 72 hours. Urine analysis: No results found for: COLORURINE, APPEARANCEUR, LABSPEC, [place], GLUCOSEU, HGBUR, BILIRUBINUR, KETONESUR, PROTEINUR, UROBILINOGEN, NITRITE, LEUKOCYTESUR  Radiological Exams on Admission: CT Head Wo Contrast  Result Date: 03/16/2020 CLINICAL DATA:  Follow-up stroke, dizziness EXAM: CT HEAD WITHOUT CONTRAST TECHNIQUE: Contiguous axial images were obtained from the base of the skull through the vertex without intravenous contrast. COMPARISON:  Same day MR brain, 03/16/2020 FINDINGS: Brain: Extensive periventricular and deep white matter hypodensity. Subtle focal hypodensity of the left thalamus in keeping with acute diffusion restricting infarction seen on same day MR (series 2, image 16). No evidence of hemorrhage, hydrocephalus, extra-axial collection or mass lesion/mass effect. Vascular: No hyperdense vessel or unexpected calcification. Skull: Normal. Negative for fracture or focal lesion. Sinuses/Orbits: No acute finding. Other: None. IMPRESSION: 1. Subtle focal hypodensity of the left thalamus in keeping with acute diffusion restricting infarction seen on same day MR. No evidence of hemorrhage. 2. Advanced small-vessel white matter disease. Electronically Signed   By: Eddie Candle M.D.   On: 03/16/2020 17:02   MR BRAIN/IAC W WO CONTRAST  Result Date: 03/16/2020 CLINICAL DATA:  Vertigo of central origin. Dizziness; vertigo, central. Additional history provided by scanning technologist: Patient reports 3-4 months of dizziness and  nausea. EXAM: MRI HEAD WITHOUT AND WITH CONTRAST TECHNIQUE: Multiplanar, multiecho pulse sequences of the brain and surrounding structures were obtained without and with intravenous contrast. CONTRAST:  62mL MULTIHANCE GADOBENATE DIMEGLUMINE 529 MG/ML IV SOLN COMPARISON:  CT cervical spine 06/05/2017. FINDINGS: Brain: Moderate generalized parenchymal atrophy. 9 mm  focus of restricted diffusion consistent with acute infarction within the left thalamus. Moderate patchy T2/FLAIR hyperintensity within the cerebral white matter is nonspecific, but consistent with chronic small vessel ischemic disease. 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 No intracranial mass is identified. Specifically, no cerebellopontine angle mass or internal auditory canal lesion is demonstrated. Unremarkable appearance of the seventh and eighth cranial nerves bilaterally. No abnormal intracranial enhancement. There is no acute infarct. No chronic intracranial blood products. No extra-axial fluid collection. No midline shift. Vascular: Expected proximal arterial flow voids. Skull and upper cervical spine: 12 mm T1 hypointense lesion within the ventral aspect of the dens (series 5, image 13) (series 14, image 2). There is no appreciable lesion at this site on the prior CT cervical spine of 06/05/2017. Sinuses/Orbits: Bilateral lens replacements. Mild ethmoid sinus mucosal thickening. No significant mastoid effusion. 9 mm acute left thalamic infarct. These results were called by telephone at the time of interpretation on 03/16/2020 at 3:51 pm to provider Dr. Floyde Parkins, who verbally acknowledged these results. Indeterminate 12 mm lesion within the dens. These results will be called to the ordering clinician or representative by the Radiologist Assistant, and communication documented in the PACS or [employer]. IMPRESSION: 9 mm acute left thalamic infarct. No cerebellopontine angle mass or internal auditory  canal lesion is demonstrated. Unremarkable appearance of the seventh and eighth cranial nerves bilaterally. Moderate generalized parenchymal atrophy and chronic small vessel ischemic disease. 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 Indeterminate 12 mm lesion within the ventral aspect of the dens. Contrast-enhanced cervical spine MRI is recommended for further characterization, as clinically warranted. Mild ethmoid sinus mucosal thickening. Electronically Signed   By: Kellie Simmering DO   On: 03/16/2020 15:59    EKG: Independently reviewed. NSR  Assessment/Plan Active Problems:   CVA (cerebral vascular accident) ([place])   High blood pressure   Diabetes ([place])   Gastroesophageal reflux disease without esophagitis  (please populate well all problems here in Problem List. (For example, if patient is on BP meds at home and you resume or decide to hold them, it is a problem that needs to be her. Same for CAD, COPD, HLD and so on)   1. Neuro- patient with neurogenic orthostatic hypotension who had MRI as part of neuro ordered w/u which revealed an acute thalamic CVA. He is asympotmatic except he does admit to a change in his swallow that is slight. Plan Per neuro request - admit to medical tele at [facility]  Routine w/u: CXR, Carotid dopplers, 2 D echo, CT angio head/neck  PT/OT eval  Continue ASA, add plavix 75 mg daily  2. HTN- mildly hypertensive in ED. Plan Continue home meds: atacand, norvasc  For SBP > 170 or DBP > 110 hydralazine 5mg  IV  3. DM - Last A1C 05/19/2019 7.2%. On no meds Plan When taking diet - carb modified  Sliding scale coverage  4. GERD w/o esophagitis - continue home meds  5. Anemia - Hgb 9.0 with last Hgb 13.8 09/09/17. Is taking iron Plan Heme test stools  Anemia panel  Continue supplemental iron  6. AKI - patient with mild elevation in Cr Plan Gentle hydration  7. Code status - discussed with patient - a novel discussion for him. Full  code at this time. Advised to discuss with PCP.  DVT prophylaxis: lovenox Code Status: full code  Family Communication: attempted to call wife, Rakin Lemelle, no answer  Consults called: Neuro -  Dr. Jannifer Franklin was called with MRI report and sent patient to hospital  Admission status: medical tele inpatient (inpatient / obs / tele / medical floor / SDU)   Adella Hare MD [facility] Pager [phone]  If 7PM-7AM, please contact night-coverage [email or web address] Password [email or web address]  03/16/2020, 7:07 PM

## 2020-03-16 NOTE — Telephone Encounter (Signed)
The patient appears to have gone to [facility] ER, he will need to be transferred to [facility] hospital for a stroke workup.

## 2020-03-16 NOTE — Telephone Encounter (Signed)
I called the patient, left 2 messages.  MRI of the brain done today shows evidence of an acute left thalamic stroke.  He has had symptoms of dizziness for at least a couple months, this probably therefore has nothing to do with his symptoms.  He has had at least a moderate level of chronic small vessel disease as well.  I have recommended that he go to the [facility] emergency room for evaluation.  He has a history of diabetes and hypertension.  I will try to call back later to see if I can reach him.

## 2020-03-16 NOTE — ED Provider Notes (Addendum)
[facility] DEPT Provider Note   CSN: [id number] Arrival date & time: 03/16/20  1527     History Chief Complaint  Patient presents with  . Dizziness  . Nausea    Violet Cart is a 79 y.o. male.  HPI    79 y/o comes in with cc of dizziness and nausea. Pt reports 3 months of poor balance. PT is fine when laying flat, but when he moves he get dizzy and staggers when he walks.   He has hx of htn, hl and dm. No strokes. PCP managing BP closely, as they are thinking that is the reason. Saw Neuro recently and had MRI - which was showing stroke.   Past Medical History:  Diagnosis Date  . Anxiety   . Arthritis   . Ataxia   . B12 deficiency   . Back ache    back problems  . Cataract   . Depression   . Diabetes ([place]) 06/23/2016   type 2  . Diabetic retinopathy ([place])   . Dizziness   . Fatigue   . GERD (gastroesophageal reflux disease)   . Hearing loss   . High blood pressure 06/23/2016  . High cholesterol 06/23/2016  . History of kidney stones   . Insomnia   . Kidney stone   . Lumbar radiculopathy   . Osteoarthritis   . Osteomyelitis ([place])    L spine  . Polyneuropathy   . PUD (peptic ulcer disease)   . Tremor     Patient Active Problem List   Diagnosis Date Noted  . Chronic bilateral low back pain 05/24/2019  . Gastroesophageal reflux disease without esophagitis 05/24/2019  . 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 High blood pressure 06/23/2016  . Diabetes ([place]) 06/23/2016    Past Surgical History:  Procedure Laterality Date  . CATARACT EXTRACTION     bilateral  . EXTRACORPOREAL SHOCK WAVE LITHOTRIPSY Left 11/29/2017   Procedure: LEFT EXTRACORPOREAL SHOCK WAVE LITHOTRIPSY (ESWL);  Surgeon: Franchot Gallo, MD;  Location: [facility];  Service: Urology;  Laterality: Left;  . EYE SURGERY    . IR LUMBAR DISC ASPIRATION W/IMG GUIDE  09/09/2017  . JOINT REPLACEMENT     toe 40 yrs ago       Family History  Problem Relation  Age of Onset  . Cancer Mother   . Heart attack Father     Social History   Tobacco Use  . Smoking status: Former Research scientist (life sciences)  . Smokeless tobacco: Never Used  . Tobacco comment: quit > 40 yrs ago  Vaping Use  . Vaping Use: Never used  Substance Use Topics  . Alcohol use: No  . Drug use: No    Home Medications Prior to Admission medications   Medication Sig Start Date End Date Taking? Authorizing Provider  Alpha-Lipoic Acid 100 MG CAPS Take 1 capsule by mouth daily.    [provider]  amLODipine (NORVASC) 5 MG tablet 5 mg daily. 01/23/20   [provider]  aspirin 81 MG tablet Take 81 mg by mouth daily.     [provider]  BELSOMRA 10 MG TABS Take 1 tablet by mouth at bedtime. 11/01/19   [provider]  candesartan (ATACAND) 16 MG tablet 16 mg daily. 11/01/19   [provider]  cetirizine (ZYRTEC) 10 MG tablet Take 10 mg by mouth daily. 11/06/19   [provider]  diclofenac Sodium (VOLTAREN) 1 % GEL  09/14/19   [provider]  ferrous gluconate (FERGON)  324 MG tablet Take 324 mg by mouth daily with breakfast.    [provider]  gabapentin (NEURONTIN) 300 MG capsule Take 300 mg by mouth at bedtime. Patient not taking: Reported on 02/19/2020    [provider]  glipiZIDE (GLUCOTROL XL) 10 MG 24 hr tablet  12/02/19   [provider]  LORazepam (ATIVAN) 1 MG tablet Take 1 tablet by mouth daily. 03/13/20   [provider]  losartan (COZAAR) 50 MG tablet Take one BID Patient not taking: Reported on 02/19/2020 05/24/19   Maryruth Hancock, MD  meclizine (ANTIVERT) 25 MG tablet as needed. 01/31/20   [provider]  metFORMIN (GLUCOPHAGE-XR) 500 MG 24 hr tablet Take 500-1,000 mg by mouth See admin instructions. Pt takes med every other day. 1000 mg in the morning, and 500 mg in the evening Patient not taking: Reported on 02/19/2020 07/27/17   [provider]  metoCLOPramide (REGLAN) 5 MG  tablet Take 5 mg by mouth 3 (three) times daily. 01/22/20   [provider]  metoprolol (LOPRESSOR) 50 MG tablet Take 50 mg by mouth 2 (two) times daily.  Patient not taking: Reported on 02/19/2020    [provider]  naproxen sodium (ALEVE) 220 MG tablet Take 440 mg by mouth 2 (two) times daily as needed.    [provider]  omeprazole (PRILOSEC) 20 MG capsule Take 20 mg by mouth daily.    [provider]  ondansetron (ZOFRAN) 4 MG tablet Take 4 mg by mouth every 8 (eight) hours as needed for nausea or vomiting.    [provider]  pantoprazole (PROTONIX) 40 MG tablet Take 40 mg by mouth 2 (two) times daily. 10/10/19   [provider]  pioglitazone (ACTOS) 15 MG tablet Take 15 mg by mouth daily. Patient not taking: Reported on 02/19/2020    [provider]  scopolamine (TRANSDERM-SCOP) 1 MG/3DAYS  02/06/20   [provider]  simvastatin (ZOCOR) 20 MG tablet Take 20 mg by mouth daily.    [provider]  sucralfate (CARAFATE) 1 g tablet  11/05/19   [provider]  traMADol (ULTRAM) 50 MG tablet Take 50 mg by mouth every 6 (six) hours as needed.    [provider]  valsartan (DIOVAN) 160 MG tablet Take 160 mg by mouth daily. 03/14/20   [provider]    Allergies    Bactrim [sulfamethoxazole-trimethoprim], Hydrocodone, Januvia [sitagliptin], Norvasc [amlodipine], and Primidone  Review of Systems   Review of Systems  Constitutional: Positive for activity change.  Respiratory: Negative for shortness of breath.   Cardiovascular: Negative for chest pain.  Gastrointestinal: Positive for nausea. Negative for vomiting.  Neurological: Positive for dizziness.    Physical Exam Updated Vital Signs BP (!) 115/100   Pulse 71   Temp 98.1 F (36.7 C) (Oral)   Resp 19   Ht 5\' 8"  (1.727 m)   Wt 75.8 kg   SpO2 99%   BMI 25.39 kg/m   Physical Exam Vitals and nursing note reviewed.    Constitutional:      Appearance: He is well-developed.  HENT:     Head: Atraumatic.  Cardiovascular:     Rate and Rhythm: Normal rate.  Pulmonary:     Effort: Pulmonary effort is normal.  Musculoskeletal:     Cervical back: Neck supple.  Skin:    General: Skin is warm.  Neurological:     General: No focal deficit present.     Mental Status: He  is alert and oriented to person, place, and time.     Cranial Nerves: No cranial nerve deficit.     Sensory: No sensory deficit.     Motor: No weakness.     Coordination: Coordination normal.     Gait: Gait abnormal.     ED Results / Procedures / Treatments   Labs (all labs ordered are listed, but only abnormal results are displayed) Labs Reviewed  CBC - Abnormal; Notable for the following components:      Result Value   RBC 3.61 (*)    Hemoglobin 9.0 (*)    HCT 29.3 (*)    MCH 24.9 (*)    RDW 16.9 (*)    All other components within normal limits  DIFFERENTIAL - Abnormal; Notable for the following components:   Abs Immature Granulocytes 0.15 (*)    All other components within normal limits  SARS CORONAVIRUS 2 BY RT PCR (HOSPITAL ORDER, [facility])  PROTIME-INR  APTT  COMPREHENSIVE METABOLIC PANEL  RAPID URINE DRUG SCREEN, HOSP PERFORMED  URINALYSIS, ROUTINE W REFLEX MICROSCOPIC    EKG None  Date: 03/16/2020  Rate: 73  Rhythm: normal sinus rhythm  QRS Axis: normal  Intervals: normal  ST/T Wave abnormalities: normal  Conduction Disutrbances: none  Narrative Interpretation: unremarkable     Radiology CT Head Wo Contrast  Result Date: 03/16/2020 CLINICAL DATA:  Follow-up stroke, dizziness EXAM: CT HEAD WITHOUT CONTRAST TECHNIQUE: Contiguous axial images were obtained from the base of the skull through the vertex without intravenous contrast. COMPARISON:  Same day MR brain, 03/16/2020 FINDINGS: Brain: Extensive periventricular and deep white matter hypodensity. Subtle focal hypodensity of the  left thalamus in keeping with acute diffusion restricting infarction seen on same day MR (series 2, image 16). No evidence of hemorrhage, hydrocephalus, extra-axial collection or mass lesion/mass effect. Vascular: No hyperdense vessel or unexpected calcification. Skull: Normal. Negative for fracture or focal lesion. Sinuses/Orbits: No acute finding. Other: None. IMPRESSION: 1. Subtle focal hypodensity of the left thalamus in keeping with acute diffusion restricting infarction seen on same day MR. No evidence of hemorrhage. 2. Advanced small-vessel white matter disease. Electronically Signed   By: Eddie Candle M.D.   On: 03/16/2020 17:02   MR BRAIN/IAC W WO CONTRAST  Result Date: 03/16/2020 CLINICAL DATA:  Vertigo of central origin. Dizziness; vertigo, central. Additional history provided by scanning technologist: Patient reports 3-4 months of dizziness and nausea. EXAM: MRI HEAD WITHOUT AND WITH CONTRAST TECHNIQUE: Multiplanar, multiecho pulse sequences of the brain and surrounding structures were obtained without and with intravenous contrast. CONTRAST:  41mL MULTIHANCE GADOBENATE DIMEGLUMINE 529 MG/ML IV SOLN COMPARISON:  CT cervical spine 06/05/2017. FINDINGS: Brain: Moderate generalized parenchymal atrophy. 9 mm focus of restricted diffusion consistent with acute infarction within the left thalamus. Moderate patchy T2/FLAIR hyperintensity within the cerebral white matter is nonspecific, but consistent with chronic small vessel ischemic disease. 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 No intracranial mass is identified. Specifically, no cerebellopontine angle mass or internal auditory canal lesion is demonstrated. Unremarkable appearance of the seventh and eighth cranial nerves bilaterally. No abnormal intracranial enhancement. There is no acute infarct. No chronic intracranial blood products. No extra-axial fluid collection. No midline shift. Vascular: Expected proximal  arterial flow voids. Skull and upper cervical spine: 12 mm T1 hypointense lesion within the ventral aspect of the dens (series 5, image 13) (series 14, image 2). There is no appreciable lesion at this site on the  prior CT cervical spine of 06/05/2017. Sinuses/Orbits: Bilateral lens replacements. Mild ethmoid sinus mucosal thickening. No significant mastoid effusion. 9 mm acute left thalamic infarct. These results were called by telephone at the time of interpretation on 03/16/2020 at 3:51 pm to provider Dr. Floyde Parkins, who verbally acknowledged these results. Indeterminate 12 mm lesion within the dens. These results will be called to the ordering clinician or representative by the Radiologist Assistant, and communication documented in the PACS or [employer]. IMPRESSION: 9 mm acute left thalamic infarct. No cerebellopontine angle mass or internal auditory canal lesion is demonstrated. Unremarkable appearance of the seventh and eighth cranial nerves bilaterally. Moderate generalized parenchymal atrophy and chronic small vessel ischemic disease. 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 Indeterminate 12 mm lesion within the ventral aspect of the dens. Contrast-enhanced cervical spine MRI is recommended for further characterization, as clinically warranted. Mild ethmoid sinus mucosal thickening. Electronically Signed   By: Kellie Simmering DO   On: 03/16/2020 15:59    Procedures Procedures (including critical care time)  Medications Ordered in ED Medications - No data to display  ED Course  I have reviewed the triage vital signs and the nursing notes.  Pertinent labs & imaging results that were available during my care of the patient were reviewed by me and considered in my medical decision making (see chart for details).    MDM Rules/Calculators/A&P                          Pt comes in with cc of dizziness and balance issues. MRI today showed acute thalamic  stroke.  Pt has non focal neuro finding. Seems like incidental finding, as he is last normal 3 months ago.  SPoke with Neuro. Dr. Drenda Freeze reviewed the mri and wants [facility].  Pt made aware.  Final Clinical Impression(s) / ED Diagnoses Final diagnoses:  Acute thalamic infarction [facility])    Rx / DC Orders ED Discharge Orders    None       Varney Biles, MD 03/16/20 1717    Varney Biles, MD 03/16/20 1845

## 2020-03-16 NOTE — ED Triage Notes (Signed)
Patient here from imaging center where he went for a MRI due to dizziness and nausea x1 month. MRI showed acute left thalamic infarct and referred to [facility]. Son brought patient here. Ambulatory with cane.

## 2020-03-17 ENCOUNTER — Other Ambulatory Visit (HOSPITAL_COMMUNITY): Payer: Medicare HMO

## 2020-03-17 ENCOUNTER — Inpatient Hospital Stay (HOSPITAL_COMMUNITY): Payer: Medicare HMO

## 2020-03-17 DIAGNOSIS — I639 Cerebral infarction, unspecified: Principal | ICD-10-CM

## 2020-03-17 DIAGNOSIS — I6389 Other cerebral infarction: Secondary | ICD-10-CM

## 2020-03-17 DIAGNOSIS — I63332 Cerebral infarction due to thrombosis of left posterior cerebral artery: Secondary | ICD-10-CM

## 2020-03-17 LAB — GLUCOSE, CAPILLARY
Glucose-Capillary: 250 mg/dL — ABNORMAL HIGH (ref 70–99)
Glucose-Capillary: 206 mg/dL — ABNORMAL HIGH (ref 70–99)

## 2020-03-17 LAB — CBG MONITORING, ED
Glucose-Capillary: 191 mg/dL — ABNORMAL HIGH (ref 70–99)
Glucose-Capillary: 103 mg/dL — ABNORMAL HIGH (ref 70–99)
Glucose-Capillary: 111 mg/dL — ABNORMAL HIGH (ref 70–99)

## 2020-03-17 LAB — LIPID PANEL
Cholesterol: 149 mg/dL (ref 0–200)
HDL: 35 mg/dL — ABNORMAL LOW (ref >40)
LDL Cholesterol: 91 mg/dL (ref 0–99)
Total CHOL/HDL Ratio: 4.3 RATIO
Triglycerides: 113 mg/dL (ref <150)
VLDL: 23 mg/dL (ref 0–40)

## 2020-03-17 LAB — VITAMIN B12: Vitamin B-12: 311 pg/mL (ref 180–914)

## 2020-03-17 LAB — FERRITIN: Ferritin: 14 ng/mL — ABNORMAL LOW (ref 24–336)

## 2020-03-17 LAB — RETICULOCYTES
Immature Retic Fract: 36.7 % — ABNORMAL HIGH (ref 2.3–15.9)
RBC.: 3.74 MIL/uL — ABNORMAL LOW (ref 4.22–5.81)
Retic Count, Absolute: 52.4 10*3/uL (ref 19.0–186.0)
Retic Ct Pct: 1.4 % (ref 0.4–3.1)

## 2020-03-17 LAB — IRON AND TIBC
Iron: 70 ug/dL (ref 45–182)
Saturation Ratios: 20 % (ref 17.9–39.5)
TIBC: 357 ug/dL (ref 250–450)
UIBC: 287 ug/dL

## 2020-03-17 LAB — FOLATE: Folate: 19.2 ng/mL (ref >5.9)

## 2020-03-17 LAB — ECHOCARDIOGRAM COMPLETE
Area-P 1/2: 3.89 cm2
Height: 68 in
S' Lateral: 2.2 cm
Weight: 2672 oz

## 2020-03-17 MED ORDER — PERFLUTREN LIPID MICROSPHERE
1.0000 mL | INTRAVENOUS | Status: AC | PRN
  Administered 2020-03-17: 2 mL via INTRAVENOUS
  Filled 2020-03-17: qty 10

## 2020-03-17 MED ORDER — CLOPIDOGREL BISULFATE 75 MG PO TABS: 75.0000 mg | ORAL_TABLET | Freq: Every day | ORAL | 0 refills | Status: AC

## 2020-03-17 MED ORDER — NAPROXEN 250 MG PO TABS: 500.0000 mg | ORAL_TABLET | Freq: Two times a day (BID) | ORAL | Status: DC | PRN

## 2020-03-17 MED ORDER — SIMVASTATIN 40 MG PO TABS: 40.0000 mg | ORAL_TABLET | Freq: Every day | ORAL | 0 refills | Status: AC

## 2020-03-17 MED ORDER — ASPIRIN EC 81 MG PO TBEC: 81.0000 mg | DELAYED_RELEASE_TABLET | Freq: Every day | ORAL | Status: DC

## 2020-03-17 MED ORDER — ASPIRIN 81 MG PO TABS: 81.0000 mg | ORAL_TABLET | Freq: Every day | ORAL | 0 refills | Status: AC

## 2020-03-17 MED ORDER — SIMVASTATIN 20 MG PO TABS: 40.0000 mg | ORAL_TABLET | Freq: Every day | ORAL | Status: DC

## 2020-03-17 NOTE — TOC Transition Note (Signed)
Transition of Care [facility]) - CM/SW Discharge Note   Patient Details  Name: Thomas Adkins MRN: [id number] Date of Birth: [birth year]  [facility] CM/SW Contact:  Pollie Friar, RN Phone Number: 03/17/2020, 3:59 PM   Clinical Narrative:    CM consulted for outpatient therapy. CM met with the patient and his spouse. They prefer [place] over outpatient. CM updated MD.  Alvis Lemmings was selected for [facility] and [place] with Alvis Lemmings accepted.  Pt has supervision at home and transport to home.   Final next level of care: Home w Home Health Services Barriers to Discharge: No Barriers Identified   Patient Goals and CMS Choice   CMS Medicare.gov Compare Post Acute Care list provided to:: Patient Choice offered to / list presented to : Patient, Spouse  Discharge Placement                       Discharge Plan and Services                          HH Arranged: PT (vestibular) [place]: [place] Date [place]: 03/17/20   Representative spoke with at [place]: [place] ([place]) Interventions     Readmission Risk Interventions No flowsheet data found.

## 2020-03-17 NOTE — Evaluation (Signed)
Physical Therapy Evaluation Patient Details Name: Thomas Adkins MRN: [id number] DOB: 1941-01-06 Today's Date: 03/17/2020   History of Present Illness  Patient is a 79 y/o male who presents with dizziness and nausea for ~1 month. Brain MRI-left thalamic infarct. PMH includes polyneuropathy, HTN, HLD, depression, DM, OA.  Clinical Impression  Patient presents with generalized weakness, dizziness, impaired balance and impaired mobility s/p above. Pt lives with wife and reports having dizziness for ~52month PTA. Pt uses walking stick for support during gait. Today, pt tolerated bed mobility, transfers and gait training with Min guard-Min A and walking stick for safety. Reports dizziness is better than it has been but normally not able to tolerate being up for long periods due to worsened dizziness and nausea. Will follow acutely to maximize independence and mobility prior to return home. See orthostatic vitals below. Supine BP 147/72, HR 102 bpm Sitting BP 126/103, HR 113 bpm Standing BP 127/72, HR 118 bpm Sitting BP post activity 160/84, HR 114 bpm. Reports dizziness throughout mobility but not worsened with any position.     Follow Up Recommendations Outpatient PT (vestibular OPPT)    Equipment Recommendations  None recommended by PT    Recommendations for Other Services       Precautions / Restrictions Precautions Precautions: Fall Precaution Comments: dizziness, watch BP Restrictions Weight Bearing Restrictions: No      Mobility  Bed Mobility Overal bed mobility: Needs Assistance Bed Mobility: Supine to Sit;Sit to Supine     Supine to sit: Supervision;HOB elevated Sit to supine: Supervision;HOB elevated   General bed mobility comments: Use of momentum and LE to swing trunk up to EOB. + dizziness.  Transfers Overall transfer level: Needs assistance Equipment used: None;Straight cane Transfers: Sit to/from Stand Sit to Stand: Min guard         General transfer comment:  Min guard for safety. Stood from EOB x2. + dizziness "the room is moving."  Ambulation/Gait Ambulation/Gait assistance: Min guard;Min assist Gait Distance (Feet): 120 Feet Assistive device: Straight cane Gait Pattern/deviations: Step-through pattern;Decreased stride length;Drifts right/left Gait velocity: decreased   General Gait Details: Slow, mildly unsteady gait with walking stick. Reports some dizziness. Tolerates some head movements with no overt LOB or worsened symptoms. Report sfeeling better today then previously.  Stairs            Wheelchair Mobility    Modified Rankin (Stroke Patients Only) Modified Rankin (Stroke Patients Only) Pre-Morbid Rankin Score: Moderate disability Modified Rankin: Moderately severe disability     Balance Overall balance assessment: History of Falls;Needs assistance Sitting-balance support: Feet supported;No upper extremity supported Sitting balance-Leahy Scale: Good     Standing balance support: During functional activity Standing balance-Leahy Scale: Fair Standing balance comment: Min guard for safety.                             Pertinent Vitals/Pain Pain Assessment: No/denies pain    Home Living Family/patient expects to be discharged to:: Private residence Living Arrangements: Spouse/significant other Available Help at Discharge: Family;Available 24 hours/day Type of Home: House Home Access: Ramped entrance     Home Layout: Two level;Able to live on main level with bedroom/bathroom Home Equipment: Kasandra Knudsen - single point;Shower seat;Grab bars - tub/shower;Walker - 2 wheels;Bedside commode;Wheelchair - [employer] - 4 wheels      Prior Function Level of Independence: Independent with assistive device(s)         Comments: Drives. does own ADls and some IADLs.  Uses walking stick for ambulation. Reports feeling nauseous when up walking for ~30 mintes. Tried OPPT and was not able to tolerate it due to feeling  nauseous. Reports 1 fall stubbing toe.     Hand Dominance   Dominant Hand: Right    Extremity/Trunk Assessment   Upper Extremity Assessment Upper Extremity Assessment: Defer to OT evaluation    Lower Extremity Assessment Lower Extremity Assessment: Generalized weakness    Cervical / Trunk Assessment Cervical / Trunk Assessment: Kyphotic  Communication   Communication: No difficulties  Cognition Arousal/Alertness: Awake/alert Behavior During Therapy: WFL for tasks assessed/performed Overall Cognitive Status: Within Functional Limits for tasks assessed                                        General Comments General comments (skin integrity, edema, etc.): Wife and son present during session.    Exercises     Assessment/Plan    PT Assessment Patient needs continued PT services  PT Problem List Decreased strength;Decreased mobility;Decreased balance;Decreased activity tolerance       PT Treatment Interventions Therapeutic activities;Gait training;Therapeutic exercise;Patient/family education;Balance training;Functional mobility training;Stair training;Neuromuscular re-education    PT Goals (Current goals can be found in the Care Plan section)  Acute Rehab PT Goals Patient Stated Goal: to get better and go home PT Goal Formulation: With patient/family Time For Goal Achievement: 03/31/20 Potential to Achieve Goals: Good    Frequency Min 4X/week   Barriers to discharge        Co-evaluation               AM-PAC PT "6 Clicks" Mobility  Outcome Measure Help needed turning from your back to your side while in a flat bed without using bedrails?: None Help needed moving from lying on your back to sitting on the side of a flat bed without using bedrails?: None Help needed moving to and from a bed to a chair (including a wheelchair)?: A Little Help needed standing up from a chair using your arms (e.g., wheelchair or bedside chair)?: A Little Help  needed to walk in hospital room?: A Little Help needed climbing 3-5 steps with a railing? : A Little 6 Click Score: 20    End of Session Equipment Utilized During Treatment: Gait belt Activity Tolerance: Patient tolerated treatment well Patient left: in bed;with call bell/phone within reach;with bed alarm set;with family/visitor present Nurse Communication: Mobility status;Other (comment) (need for follow up OPPT) PT Visit Diagnosis: Muscle weakness (generalized) (M62.81);Difficulty in walking, not elsewhere classified (R26.2);Dizziness and giddiness (R42)    Time: 1610-9604 PT Time Calculation (min) (ACUTE ONLY): 28 min   Charges:   PT Evaluation $PT Eval Moderate Complexity: 1 Mod PT Treatments $Gait Training: 8-22 mins        Marisa Severin, PT, DPT Acute Rehabilitation Services Pager [phone] Office [place] 03/17/2020, 2:24 PM

## 2020-03-17 NOTE — Progress Notes (Signed)
  Echocardiogram 2D Echocardiogram has been performed.  Jennette Dubin 03/17/2020, 3:32 PM

## 2020-03-17 NOTE — Plan of Care (Signed)
  Problem: Education: Goal: Knowledge of disease or condition will improve Outcome: Completed/Met Goal: Knowledge of secondary prevention will improve Outcome: Completed/Met Goal: Knowledge of patient specific risk factors addressed and post discharge goals established will improve Outcome: Completed/Met Goal: Individualized Educational Video(s) Outcome: Completed/Met   Problem: Coping: Goal: Will verbalize positive feelings about self Outcome: Completed/Met Goal: Will identify appropriate support needs Outcome: Completed/Met   Problem: Health Behavior/Discharge Planning: Goal: Ability to manage health-related needs will improve Outcome: Completed/Met   Problem: Self-Care: Goal: Ability to participate in self-care as condition permits will improve Outcome: Completed/Met Goal: Verbalization of feelings and concerns over difficulty with self-care will improve Outcome: Completed/Met Goal: Ability to communicate needs accurately will improve Outcome: Completed/Met   Problem: Nutrition: Goal: Risk of aspiration will decrease Outcome: Completed/Met Goal: Dietary intake will improve Outcome: Completed/Met   Problem: Intracerebral Hemorrhage Tissue Perfusion: Goal: Complications of Intracerebral Hemorrhage will be minimized Outcome: Completed/Met   Problem: Ischemic Stroke/TIA Tissue Perfusion: Goal: Complications of ischemic stroke/TIA will be minimized Outcome: Completed/Met

## 2020-03-17 NOTE — Consult Note (Signed)
Requesting Physician: Dr. Kathrynn Humble    Chief Complaint: 4 month history of dizziness/vertigo  History obtained from: Patient and Chart    HPI:                                                                                                                                       Thomas Adkins is a 79 y.o. male with past medical history significant for hypertension, diabetes mellitus who was seen at [facility] neurology associates for episodes of dizziness/vertigo usually provoked from sitting to standing or bending down that started in March of this year.  Work-up included MRI brain which demonstrated a left thalamic infarct.  Patient denies any symptoms such as numbness on the right side, right arm or leg weakness, difficulty speaking or slurred speech, vision changes.  He has longstanding gait difficulty which he attributes to his diabetic neuropathy.  Patient requested to go to the ER and presented to [facility] long emergency department.  He was admitted for further stroke work-up and neurology was consulted.   tPA Given: no, outside window  NIHSS: 0 Baseline MRS 0  Past Medical History:  Diagnosis Date  . Anxiety   . Arthritis   . Ataxia   . B12 deficiency   . Back ache    back problems  . Cataract   . Depression   . Diabetes ([place]) 06/23/2016   type 2  . Diabetic retinopathy ([place])   . Dizziness   . Fatigue   . GERD (gastroesophageal reflux disease)   . Hearing loss   . High blood pressure 06/23/2016  . High cholesterol 06/23/2016  . History of kidney stones   . Insomnia   . Kidney stone   . Lumbar radiculopathy   . Osteoarthritis   . Osteomyelitis ([place])    L spine  . Polyneuropathy   . PUD (peptic ulcer disease)   . Tremor     Past Surgical History:  Procedure Laterality Date  . CATARACT EXTRACTION     bilateral  . EXTRACORPOREAL SHOCK WAVE LITHOTRIPSY Left 11/29/2017   Procedure: LEFT EXTRACORPOREAL SHOCK WAVE LITHOTRIPSY (ESWL);  Surgeon: Franchot Gallo, MD;   Location: [facility] ORS;  Service: Urology;  Laterality: Left;  . EYE SURGERY    . IR LUMBAR DISC ASPIRATION W/IMG GUIDE  09/09/2017  . JOINT REPLACEMENT     toe 40 yrs ago    Family History  Problem Relation Age of Onset  . Cancer Mother   . Heart attack Father    Social History:  reports that he has quit smoking. He has never used smokeless tobacco. He reports that he does not drink alcohol and does not use drugs.  Allergies:  Allergies  Allergen Reactions  . Bactrim [Sulfamethoxazole-Trimethoprim] Other (See Comments)    Fatigue, dry mouth  . Hydrocodone Nausea And Vomiting  . Januvia [Sitagliptin] Other (See Comments)    fatigue  . Metformin And Related  Nausea And Vomiting and Other (See Comments)    headache  . Norvasc [Amlodipine] Other (See Comments)    edema  . Primidone Nausea Only    Medications:                                                                                                                        I reviewed home medications   ROS:                                                                                                                                     14 systems reviewed and negative except above   Examination:                                                                                                      General: Appears well-developed  Psych: Affect appropriate to situation Eyes: No scleral injection HENT: No OP obstrucion Head: Normocephalic.  Cardiovascular: Normal rate and regular rhythm.  Respiratory: Effort normal and breath sounds normal to anterior ascultation GI: Soft.  No distension. There is no tenderness.  Skin: WDI    Neurological Examination Mental Status: Alert, oriented, thought content appropriate.  Speech fluent without evidence of aphasia. Able to follow 3 step commands without difficulty. Cranial Nerves: II: Visual fields grossly normal,  III,IV, VI: ptosis not present, extra-ocular motions intact  bilaterally, pupils equal, round, reactive to light and accommodation V,VII: smile symmetric, facial light touch sensation normal bilaterally VIII: hearing normal bilaterally IX,X: uvula rises symmetrically XI: bilateral shoulder shrug XII: midline tongue extension Motor: Right : Upper extremity   5/5    Left:     Upper extremity   5/5  Lower extremity   5/5     Lower extremity   5/5 Tone and bulk:normal tone throughout; no atrophy noted Sensory: Reduced sensation in stocking distribution in both legs Plantars: Right: downgoing   Left: downgoing Cerebellar: normal finger-to-nose, normal rapid alternating movements and normal heel-to-shin test Gait: normal  gait, no rombergs     Lab Results: Basic Metabolic Panel: Recent Labs  Lab 03/16/20 1739  NA 136  K 4.3  CL 103  CO2 25  GLUCOSE 194*  BUN 26*  CREATININE 1.24  CALCIUM 8.6*    CBC: Recent Labs  Lab 03/16/20 1739  WBC 9.4  NEUTROABS 6.1  HGB 9.0*  HCT 29.3*  MCV 81.2  PLT 281    Coagulation Studies: Recent Labs    03/16/20 1739  LABPROT 12.9  INR 1.0    Imaging: CT ANGIO HEAD W OR WO CONTRAST  Result Date: 03/16/2020 CLINICAL DATA:  Stroke. Dizziness and nausea 1 month. Left thalamic infarct on MRI today. EXAM: CT ANGIOGRAPHY HEAD AND NECK TECHNIQUE: Multidetector CT imaging of the head and neck was performed using the standard protocol during bolus administration of intravenous contrast. Multiplanar CT image reconstructions and MIPs were obtained to evaluate the vascular anatomy. Carotid stenosis measurements (when applicable) are obtained utilizing NASCET criteria, using the distal internal carotid diameter as the denominator. CONTRAST:  110mL OMNIPAQUE IOHEXOL 350 MG/ML SOLN COMPARISON:  MRI head 03/16/2020 FINDINGS: CT HEAD FINDINGS Brain: Mild to moderate atrophy. Moderate white matter hypodensity diffusely and bilaterally consistent with chronic microvascular ischemia. Small acute infarct in the left  thalamus on MRI is not visualized by CT of this point. No acute infarct or hemorrhage. No mass lesion identified. Vascular: Normal arterial flow voids. Skull: Negative Sinuses: Paranasal sinuses clear. Orbits: Bilateral cataract extraction. Review of the MIP images confirms the above findings CTA NECK FINDINGS Aortic arch: Standard branching. Imaged portion shows no evidence of aneurysm or dissection. No significant stenosis of the major arch vessel origins. Right carotid system: Right carotid bifurcation widely patent. Approximately 1 cm above the bifurcation there is a calcified plaque in the right internal carotid artery narrowing the lumen by less than 25% diameter stenosis. Left carotid system: Minimal atherosclerotic calcification left carotid bifurcation without stenosis Vertebral arteries: Right vertebral artery dominant and patent to the basilar. Calcified plaque distal right vertebral artery with moderate stenosis. Moderate calcific stenosis origin of left vertebral artery mild stenosis distal left vertebral artery. Left vertebral artery is continuous to the basilar. Skeleton: Cervical spondylosis.  No acute skeletal abnormality. Other neck: Negative Upper chest: Lung apices clear bilaterally. Review of the MIP images confirms the above findings CTA HEAD FINDINGS Anterior circulation: Mild atherosclerotic calcification in the cavernous carotid bilaterally without significant stenosis. Anterior and middle cerebral arteries patent bilaterally without stenosis. Posterior circulation: Moderate calcific stenosis distal right vertebral artery the skull base. Mild stenosis distal left vertebral artery. PICA patent bilaterally. Basilar widely patent. Small AICA patent bilaterally. Superior cerebellar and posterior cerebral arteries patent bilaterally. No significant stenosis or large vessel occlusion. Venous sinuses: Normal venous enhancement Anatomic variants: None Review of the MIP images confirms the above  findings IMPRESSION: 1. No acute abnormality by CT. Small acute infarct left thalamus on MRI. There is atrophy and chronic microvascular ischemic changes in the white matter. 2. Mild atherosclerotic disease in the carotid artery in the neck bilaterally without significant stenosis. 3. 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 4. No intracranial large vessel occlusion. Electronically Signed   By: Franchot Gallo M.D.   On: 03/16/2020 20:40   DG Chest 2 View  Result Date: 03/16/2020 CLINICAL DATA:  Dizziness, nausea EXAM: CHEST - 2 VIEW COMPARISON:  None. FINDINGS: The heart size and mediastinal contours are within normal limits. Both  lungs are clear. The visualized skeletal structures are unremarkable. IMPRESSION: Normal study. Electronically Signed   By: Rolm Baptise M.D.   On: 03/16/2020 19:46   CT Head Wo Contrast  Result Date: 03/16/2020 CLINICAL DATA:  Follow-up stroke, dizziness EXAM: CT HEAD WITHOUT CONTRAST TECHNIQUE: Contiguous axial images were obtained from the base of the skull through the vertex without intravenous contrast. COMPARISON:  Same day MR brain, 03/16/2020 FINDINGS: Brain: Extensive periventricular and deep white matter hypodensity. Subtle focal hypodensity of the left thalamus in keeping with acute diffusion restricting infarction seen on same day MR (series 2, image 16). No evidence of hemorrhage, hydrocephalus, extra-axial collection or mass lesion/mass effect. Vascular: No hyperdense vessel or unexpected calcification. Skull: Normal. Negative for fracture or focal lesion. Sinuses/Orbits: No acute finding. Other: None. IMPRESSION: 1. Subtle focal hypodensity of the left thalamus in keeping with acute diffusion restricting infarction seen on same day MR. No evidence of hemorrhage. 2. Advanced small-vessel white matter disease. Electronically Signed   By: Eddie Candle M.D.   On: 03/16/2020 17:02   CT ANGIO NECK W  OR WO CONTRAST  Result Date: 03/16/2020 CLINICAL DATA:  Stroke. Dizziness and nausea 1 month. Left thalamic infarct on MRI today. EXAM: CT ANGIOGRAPHY HEAD AND NECK TECHNIQUE: Multidetector CT imaging of the head and neck was performed using the standard protocol during bolus administration of intravenous contrast. Multiplanar CT image reconstructions and MIPs were obtained to evaluate the vascular anatomy. Carotid stenosis measurements (when applicable) are obtained utilizing NASCET criteria, using the distal internal carotid diameter as the denominator. CONTRAST:  167mL OMNIPAQUE IOHEXOL 350 MG/ML SOLN COMPARISON:  MRI head 03/16/2020 FINDINGS: CT HEAD FINDINGS Brain: Mild to moderate atrophy. Moderate white matter hypodensity diffusely and bilaterally consistent with chronic microvascular ischemia. Small acute infarct in the left thalamus on MRI is not visualized by CT of this point. No acute infarct or hemorrhage. No mass lesion identified. Vascular: Normal arterial flow voids. Skull: Negative Sinuses: Paranasal sinuses clear. Orbits: Bilateral cataract extraction. Review of the MIP images confirms the above findings CTA NECK FINDINGS Aortic arch: Standard branching. Imaged portion shows no evidence of aneurysm or dissection. No significant stenosis of the major arch vessel origins. Right carotid system: Right carotid bifurcation widely patent. Approximately 1 cm above the bifurcation there is a calcified plaque in the right internal carotid artery narrowing the lumen by less than 25% diameter stenosis. Left carotid system: Minimal atherosclerotic calcification left carotid bifurcation without stenosis Vertebral arteries: Right vertebral artery dominant and patent to the basilar. Calcified plaque distal right vertebral artery with moderate stenosis. Moderate calcific stenosis origin of left vertebral artery mild stenosis distal left vertebral artery. Left vertebral artery is continuous to the basilar. Skeleton:  Cervical spondylosis.  No acute skeletal abnormality. Other neck: Negative Upper chest: Lung apices clear bilaterally. Review of the MIP images confirms the above findings CTA HEAD FINDINGS Anterior circulation: Mild atherosclerotic calcification in the cavernous carotid bilaterally without significant stenosis. Anterior and middle cerebral arteries patent bilaterally without stenosis. Posterior circulation: Moderate calcific stenosis distal right vertebral artery the skull base. Mild stenosis distal left vertebral artery. PICA patent bilaterally. Basilar widely patent. Small AICA patent bilaterally. Superior cerebellar and posterior cerebral arteries patent bilaterally. No significant stenosis or large vessel occlusion. Venous sinuses: Normal venous enhancement Anatomic variants: None Review of the MIP images confirms the above findings IMPRESSION: 1. No acute abnormality by CT. Small acute infarct left thalamus on MRI. There is atrophy and chronic microvascular ischemic changes in  the white matter. 2. Mild atherosclerotic disease in the carotid artery in the neck bilaterally without significant stenosis. 3. 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 4. No intracranial large vessel occlusion. Electronically Signed   By: Franchot Gallo M.D.   On: 03/16/2020 20:40   MR BRAIN/IAC W WO CONTRAST  Result Date: 03/16/2020 CLINICAL DATA:  Vertigo of central origin. Dizziness; vertigo, central. Additional history provided by scanning technologist: Patient reports 3-4 months of dizziness and nausea. EXAM: MRI HEAD WITHOUT AND WITH CONTRAST TECHNIQUE: Multiplanar, multiecho pulse sequences of the brain and surrounding structures were obtained without and with intravenous contrast. CONTRAST:  52mL MULTIHANCE GADOBENATE DIMEGLUMINE 529 MG/ML IV SOLN COMPARISON:  CT cervical spine 06/05/2017. FINDINGS: Brain: Moderate generalized parenchymal atrophy. 9 mm  focus of restricted diffusion consistent with acute infarction within the left thalamus. Moderate patchy T2/FLAIR hyperintensity within the cerebral white matter is nonspecific, but consistent with chronic small vessel ischemic disease. 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 No intracranial mass is identified. Specifically, no cerebellopontine angle mass or internal auditory canal lesion is demonstrated. Unremarkable appearance of the seventh and eighth cranial nerves bilaterally. No abnormal intracranial enhancement. There is no acute infarct. No chronic intracranial blood products. No extra-axial fluid collection. No midline shift. Vascular: Expected proximal arterial flow voids. Skull and upper cervical spine: 12 mm T1 hypointense lesion within the ventral aspect of the dens (series 5, image 13) (series 14, image 2). There is no appreciable lesion at this site on the prior CT cervical spine of 06/05/2017. Sinuses/Orbits: Bilateral lens replacements. Mild ethmoid sinus mucosal thickening. No significant mastoid effusion. 9 mm acute left thalamic infarct. These results were called by telephone at the time of interpretation on 03/16/2020 at 3:51 pm to provider Dr. Floyde Parkins, who verbally acknowledged these results. Indeterminate 12 mm lesion within the dens. These results will be called to the ordering clinician or representative by the Radiologist Assistant, and communication documented in the PACS or [employer]. IMPRESSION: 9 mm acute left thalamic infarct. No cerebellopontine angle mass or internal auditory canal lesion is demonstrated. Unremarkable appearance of the seventh and eighth cranial nerves bilaterally. Moderate generalized parenchymal atrophy and chronic small vessel ischemic disease. 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 Indeterminate 12 mm lesion within the ventral aspect of the dens. Contrast-enhanced cervical  spine MRI is recommended for further characterization, as clinically warranted. Mild ethmoid sinus mucosal thickening. Electronically Signed   By: Kellie Simmering DO   On: 03/16/2020 15:59     ASSESSMENT AND PLAN   79 year old male with hypertension, diabetes mellitus, chronic dizziness admitted for stroke work-up after MRI showed acute left thalamic infarct-likely representing a silent infarct due to small vessel disease in a patient who has significant vascular risk factors.  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  If cleared by PT and he obtains echocardiogram, can likely be discharged home.    Akshay Spang [facility] Pager Number [phone]

## 2020-03-17 NOTE — Progress Notes (Signed)
Patient for discharge home todday.  IV discontinued,  Telemetry removed.  AVS discussed with patient and wife. Patient denies any pain or discomfortable upon discharge.   Patient discharged home with family

## 2020-03-17 NOTE — Discharge Instructions (Signed)
Thomas Adkins,  You were in the hospital with a stroke. This was thankfully small. Please follow-up with your neurologist. You will also need to have your diabetes better controlled; please follow-up with your primary care physician.

## 2020-03-17 NOTE — Discharge Summary (Signed)
Physician Discharge Summary  Thomas Adkins FYB:[id number] DOB: [birth year] DOA: 03/16/2020  PCP: Glenda Chroman, MD  Admit date: 03/16/2020 Discharge date: 03/17/2020  Admitted From: Home Disposition: Home  Recommendations for Outpatient Follow-up:  1. Follow up with PCP in 1 week 2. Follow up with neurologist 3. Please follow up on the following pending results: None  Home Health: Home health PT Equipment/Devices: None  Discharge Condition: Stable CODE STATUS: Full code Diet recommendation: Heart healthy/carb modified   Brief/Interim Summary:  Admission HPI written by Neena Rhymes, MD   Chief Complaint: dizziness; sent by Neuro on call 2/2 MRI evidence of acute stroke  HPI: Thomas Adkins is a 79 y.o. male with medical history significant of HTN, DM who wa seen 02/19/20 by neuro for dizziness when standing. 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 A work was initiated including MRI brain which was done on the day of admission. 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 Dr. Jannifer Franklin, on call for neuro, advised the patient to go to ED for admission for stroke workup.    Hospital course:  Acute ischemic stroke Patient with an incidental left thalamic infarct on MRI. 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 Neurology with recommendations for Aspirin daily for 3 weeks in addition to Plavix daily. Transthoracic Echocardiogram obtained and significant for grade 2 diastolic dysfunction with no evidence of heart failure. LDL of 91 and hemoglobin A1C of 8.7%.  Dizziness Patient being followed by neurology. No obvious source for etiology on imaging. Patient does have some mild-moderate vertebral artery stenosis that is possibly contributing in conjunction with mild orthostatic vitals. Patient worked with PT with recommendations for outpatient PT with vestibular rehabilitation. Orthostatic  vitals negative.  Essential hypertension Patient is on amlodipine 2.5 mg daily and candesartan 8 mg daily as an outpatient. Resume on discharge.  Diabetes mellitus, type 2 Hemoglobin A1C of 8.7%. Patient has allergies listed for metformin and Januvia. Will defer to primary care physician but patient will require medication management of diabetes.Would recommend starting metformin as an outpatient. Fasting blood sugar of 103 this morning.  Hyperlipidemia Simvastatin 40 mg daily  GERD Continue Prilosec  AKI Listed on admission. No current baseline. Does not meet criteria for AKI per my assessment.  Anemia No baseline. Outpatient follow-up.  Discharge Diagnoses:  Active Problems:   High blood pressure   Diabetes (HCC)   Gastroesophageal reflux disease without esophagitis   CVA (cerebral vascular accident) [facility])    Discharge Instructions   Allergies as of 03/17/2020      Reactions   Bactrim [sulfamethoxazole-trimethoprim] Other (See Comments)   Fatigue, dry mouth   Hydrocodone Nausea And Vomiting   Januvia [sitagliptin] Other (See Comments)   fatigue   Metformin And Related Nausea And Vomiting, Other (See Comments)   headache   Norvasc [amlodipine] Other (See Comments)   edema   Primidone Nausea Only      Medication List    STOP taking these medications   losartan 50 MG tablet Commonly known as: COZAAR     TAKE these medications   amLODipine 5 MG tablet Commonly known as: NORVASC Take 2.5 mg by mouth every evening.   aspirin 81 MG tablet Take 1 tablet (81 mg total) by mouth daily for 20 days. Start taking on: March 18, 2020   candesartan 16 MG tablet Commonly known as: ATACAND Take 8 mg by mouth daily.   cetirizine 10 MG  tablet Commonly known as: ZYRTEC Take 10 mg by mouth daily as needed for allergies or rhinitis.   clopidogrel 75 MG tablet Commonly known as: PLAVIX Take 1 tablet (75 mg total) by mouth daily. Start taking on: March 18, 2020     diclofenac Sodium 1 % Gel Commonly known as: VOLTAREN Apply 2 g topically 2 (two) times daily as needed (pain).   ferrous gluconate 324 MG tablet Commonly known as: FERGON Take 324 mg by mouth daily with breakfast.   meclizine 25 MG tablet Commonly known as: ANTIVERT Take 25 mg by mouth daily as needed for dizziness.   metoCLOPramide 5 MG tablet Commonly known as: REGLAN Take 5 mg by mouth 2 (two) times daily as needed for nausea or vomiting.   naproxen sodium 220 MG tablet Commonly known as: ALEVE Take 440 mg by mouth 2 (two) times daily as needed (pain).   omeprazole 20 MG capsule Commonly known as: PRILOSEC Take 20 mg by mouth daily.   PRESERVISION AREDS 2 PO Take 2 tablets by mouth daily.   scopolamine 1 MG/3DAYS Commonly known as: TRANSDERM-SCOP Place 1 mg onto the skin every 3 (three) days.   simvastatin 40 MG tablet Commonly known as: ZOCOR Take 1 tablet (40 mg total) by mouth daily. Start taking on: March 18, 2020 What changed:   medication strength  how much to take   sucralfate 1 g tablet Commonly known as: CARAFATE Take 1 g by mouth 2 (two) times daily as needed (GERD, heartburn).   traMADol 50 MG tablet Commonly known as: ULTRAM Take 50 mg by mouth every 6 (six) hours as needed for moderate pain or severe pain.       Allergies  Allergen Reactions  . Bactrim [Sulfamethoxazole-Trimethoprim] Other (See Comments)    Fatigue, dry mouth  . Hydrocodone Nausea And Vomiting  . Januvia [Sitagliptin] Other (See Comments)    fatigue  . Metformin And Related Nausea And Vomiting and Other (See Comments)    headache  . Norvasc [Amlodipine] Other (See Comments)    edema  . Primidone Nausea Only    Consultations:  Neurology   Procedures/Studies: CT ANGIO HEAD W OR WO CONTRAST  Result Date: 03/16/2020 CLINICAL DATA:  Stroke. Dizziness and nausea 1 month. Left thalamic infarct on MRI today. EXAM: CT ANGIOGRAPHY HEAD AND NECK TECHNIQUE: Multidetector  CT imaging of the head and neck was performed using the standard protocol during bolus administration of intravenous contrast. Multiplanar CT image reconstructions and MIPs were obtained to evaluate the vascular anatomy. Carotid stenosis measurements (when applicable) are obtained utilizing NASCET criteria, using the distal internal carotid diameter as the denominator. CONTRAST:  142mL OMNIPAQUE IOHEXOL 350 MG/ML SOLN COMPARISON:  MRI head 03/16/2020 FINDINGS: CT HEAD FINDINGS Brain: Mild to moderate atrophy. Moderate white matter hypodensity diffusely and bilaterally consistent with chronic microvascular ischemia. Small acute infarct in the left thalamus on MRI is not visualized by CT of this point. No acute infarct or hemorrhage. No mass lesion identified. Vascular: Normal arterial flow voids. Skull: Negative Sinuses: Paranasal sinuses clear. Orbits: Bilateral cataract extraction. Review of the MIP images confirms the above findings CTA NECK FINDINGS Aortic arch: Standard branching. Imaged portion shows no evidence of aneurysm or dissection. No significant stenosis of the major arch vessel origins. Right carotid system: Right carotid bifurcation widely patent. Approximately 1 cm above the bifurcation there is a calcified plaque in the right internal carotid artery narrowing the lumen by less than 25% diameter stenosis. Left carotid system: Minimal  atherosclerotic calcification left carotid bifurcation without stenosis Vertebral arteries: Right vertebral artery dominant and patent to the basilar. Calcified plaque distal right vertebral artery with moderate stenosis. Moderate calcific stenosis origin of left vertebral artery mild stenosis distal left vertebral artery. Left vertebral artery is continuous to the basilar. Skeleton: Cervical spondylosis.  No acute skeletal abnormality. Other neck: Negative Upper chest: Lung apices clear bilaterally. Review of the MIP images confirms the above findings CTA HEAD FINDINGS  Anterior circulation: Mild atherosclerotic calcification in the cavernous carotid bilaterally without significant stenosis. Anterior and middle cerebral arteries patent bilaterally without stenosis. Posterior circulation: Moderate calcific stenosis distal right vertebral artery the skull base. Mild stenosis distal left vertebral artery. PICA patent bilaterally. Basilar widely patent. Small AICA patent bilaterally. Superior cerebellar and posterior cerebral arteries patent bilaterally. No significant stenosis or large vessel occlusion. Venous sinuses: Normal venous enhancement Anatomic variants: None Review of the MIP images confirms the above findings IMPRESSION: 1. No acute abnormality by CT. Small acute infarct left thalamus on MRI. There is atrophy and chronic microvascular ischemic changes in the white matter. 2. Mild atherosclerotic disease in the carotid artery in the neck bilaterally without significant stenosis. 3. 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 4. No intracranial large vessel occlusion. Electronically Signed   By: Franchot Gallo M.D.   On: 03/16/2020 20:40   DG Chest 2 View  Result Date: 03/16/2020 CLINICAL DATA:  Dizziness, nausea EXAM: CHEST - 2 VIEW COMPARISON:  None. FINDINGS: The heart size and mediastinal contours are within normal limits. Both lungs are clear. The visualized skeletal structures are unremarkable. IMPRESSION: Normal study. Electronically Signed   By: Rolm Baptise M.D.   On: 03/16/2020 19:46   CT Head Wo Contrast  Result Date: 03/16/2020 CLINICAL DATA:  Follow-up stroke, dizziness EXAM: CT HEAD WITHOUT CONTRAST TECHNIQUE: Contiguous axial images were obtained from the base of the skull through the vertex without intravenous contrast. COMPARISON:  Same day MR brain, 03/16/2020 FINDINGS: Brain: Extensive periventricular and deep white matter hypodensity. Subtle focal hypodensity of the left thalamus  in keeping with acute diffusion restricting infarction seen on same day MR (series 2, image 16). No evidence of hemorrhage, hydrocephalus, extra-axial collection or mass lesion/mass effect. Vascular: No hyperdense vessel or unexpected calcification. Skull: Normal. Negative for fracture or focal lesion. Sinuses/Orbits: No acute finding. Other: None. IMPRESSION: 1. Subtle focal hypodensity of the left thalamus in keeping with acute diffusion restricting infarction seen on same day MR. No evidence of hemorrhage. 2. Advanced small-vessel white matter disease. Electronically Signed   By: Eddie Candle M.D.   On: 03/16/2020 17:02   CT ANGIO NECK W OR WO CONTRAST  Result Date: 03/16/2020 CLINICAL DATA:  Stroke. Dizziness and nausea 1 month. Left thalamic infarct on MRI today. EXAM: CT ANGIOGRAPHY HEAD AND NECK TECHNIQUE: Multidetector CT imaging of the head and neck was performed using the standard protocol during bolus administration of intravenous contrast. Multiplanar CT image reconstructions and MIPs were obtained to evaluate the vascular anatomy. Carotid stenosis measurements (when applicable) are obtained utilizing NASCET criteria, using the distal internal carotid diameter as the denominator. CONTRAST:  159mL OMNIPAQUE IOHEXOL 350 MG/ML SOLN COMPARISON:  MRI head 03/16/2020 FINDINGS: CT HEAD FINDINGS Brain: Mild to moderate atrophy. Moderate white matter hypodensity diffusely and bilaterally consistent with chronic microvascular ischemia. Small acute infarct in the left thalamus on MRI is not visualized by CT of this point. No acute infarct or hemorrhage.  No mass lesion identified. Vascular: Normal arterial flow voids. Skull: Negative Sinuses: Paranasal sinuses clear. Orbits: Bilateral cataract extraction. Review of the MIP images confirms the above findings CTA NECK FINDINGS Aortic arch: Standard branching. Imaged portion shows no evidence of aneurysm or dissection. No significant stenosis of the major arch  vessel origins. Right carotid system: Right carotid bifurcation widely patent. Approximately 1 cm above the bifurcation there is a calcified plaque in the right internal carotid artery narrowing the lumen by less than 25% diameter stenosis. Left carotid system: Minimal atherosclerotic calcification left carotid bifurcation without stenosis Vertebral arteries: Right vertebral artery dominant and patent to the basilar. Calcified plaque distal right vertebral artery with moderate stenosis. Moderate calcific stenosis origin of left vertebral artery mild stenosis distal left vertebral artery. Left vertebral artery is continuous to the basilar. Skeleton: Cervical spondylosis.  No acute skeletal abnormality. Other neck: Negative Upper chest: Lung apices clear bilaterally. Review of the MIP images confirms the above findings CTA HEAD FINDINGS Anterior circulation: Mild atherosclerotic calcification in the cavernous carotid bilaterally without significant stenosis. Anterior and middle cerebral arteries patent bilaterally without stenosis. Posterior circulation: Moderate calcific stenosis distal right vertebral artery the skull base. Mild stenosis distal left vertebral artery. PICA patent bilaterally. Basilar widely patent. Small AICA patent bilaterally. Superior cerebellar and posterior cerebral arteries patent bilaterally. No significant stenosis or large vessel occlusion. Venous sinuses: Normal venous enhancement Anatomic variants: None Review of the MIP images confirms the above findings IMPRESSION: 1. No acute abnormality by CT. Small acute infarct left thalamus on MRI. There is atrophy and chronic microvascular ischemic changes in the white matter. 2. Mild atherosclerotic disease in the carotid artery in the neck bilaterally without significant stenosis. 3. 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 4. No intracranial large vessel occlusion.  Electronically Signed   By: Franchot Gallo M.D.   On: 03/16/2020 20:40   MR BRAIN/IAC W WO CONTRAST  Result Date: 03/16/2020 CLINICAL DATA:  Vertigo of central origin. Dizziness; vertigo, central. Additional history provided by scanning technologist: Patient reports 3-4 months of dizziness and nausea. EXAM: MRI HEAD WITHOUT AND WITH CONTRAST TECHNIQUE: Multiplanar, multiecho pulse sequences of the brain and surrounding structures were obtained without and with intravenous contrast. CONTRAST:  53mL MULTIHANCE GADOBENATE DIMEGLUMINE 529 MG/ML IV SOLN COMPARISON:  CT cervical spine 06/05/2017. FINDINGS: Brain: Moderate generalized parenchymal atrophy. 9 mm focus of restricted diffusion consistent with acute infarction within the left thalamus. Moderate patchy T2/FLAIR hyperintensity within the cerebral white matter is nonspecific, but consistent with chronic small vessel ischemic disease. 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 No intracranial mass is identified. Specifically, no cerebellopontine angle mass or internal auditory canal lesion is demonstrated. Unremarkable appearance of the seventh and eighth cranial nerves bilaterally. No abnormal intracranial enhancement. There is no acute infarct. No chronic intracranial blood products. No extra-axial fluid collection. No midline shift. Vascular: Expected proximal arterial flow voids. Skull and upper cervical spine: 12 mm T1 hypointense lesion within the ventral aspect of the dens (series 5, image 13) (series 14, image 2). There is no appreciable lesion at this site on the prior CT cervical spine of 06/05/2017. Sinuses/Orbits: Bilateral lens replacements. Mild ethmoid sinus mucosal thickening. No significant mastoid effusion. 9 mm acute left thalamic infarct. These results were called by telephone at the time of interpretation on 03/16/2020 at 3:51 pm to provider Dr. Floyde Parkins, who verbally acknowledged these results.  Indeterminate  12 mm lesion within the dens. These results will be called to the ordering clinician or representative by the Radiologist Assistant, and communication documented in the PACS or [employer]. IMPRESSION: 9 mm acute left thalamic infarct. No cerebellopontine angle mass or internal auditory canal lesion is demonstrated. Unremarkable appearance of the seventh and eighth cranial nerves bilaterally. Moderate generalized parenchymal atrophy and chronic small vessel ischemic disease. 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 Indeterminate 12 mm lesion within the ventral aspect of the dens. Contrast-enhanced cervical spine MRI is recommended for further characterization, as clinically warranted. Mild ethmoid sinus mucosal thickening. Electronically Signed   By: Kellie Simmering DO   On: 03/16/2020 15:59     TRANSTHORACIC ECHOCARDIOGRAM (03/17/2020) IMPRESSIONS    1. Left ventricular ejection fraction, by estimation, is 65 to 70%. The  left ventricle has normal function. The left ventricle has no regional  wall motion abnormalities. Left ventricular diastolic parameters are  consistent with Grade II diastolic  dysfunction (pseudonormalization).  2. Right ventricular systolic function is normal. The right ventricular  size is normal.  3. The mitral valve is normal in structure. No evidence of mitral valve  regurgitation. No evidence of mitral stenosis.  4. The aortic valve is grossly normal. Aortic valve regurgitation is  trivial. No aortic stenosis is present.   Subjective: No issues overnight. Dizziness with ambulation is stable.  Discharge Exam: Vitals:   03/17/20 0936 03/17/20 1051  BP: 130/67 (!) 170/76  Pulse: 96 99  Resp: 18 18  Temp:  98.3 F (36.8 C)  SpO2: 95% 100%   Vitals:   03/17/20 0730 03/17/20 0858 03/17/20 0936 03/17/20 1051  BP: 136/72 138/82 130/67 (!) 170/76  Pulse: 73  96 99  Resp: 15  18 18   Temp:    98.3 F (36.8  C)  TempSrc:    Oral  SpO2: 97%  95% 100%  Weight:      Height:        General: Pt is alert, awake, not in acute distress Cardiovascular: RRR, S1/S2 +, no rubs, no gallops Respiratory: CTA bilaterally, no wheezing, no rhonchi Abdominal: Soft, NT, ND, bowel sounds + Extremities: no edema, no cyanosis Neuro: 4/5 U/L strength, CN intact    The results of significant diagnostics from this hospitalization (including imaging, microbiology, ancillary and laboratory) are listed below for reference.     Microbiology: Recent Results (from the past 240 hour(s))  SARS Coronavirus 2 by RT PCR (hospital order, performed in [facility] hospital lab) Nasopharyngeal Nasopharyngeal Swab     Status: None   Collection Time: 03/16/20  5:39 PM   Specimen: Nasopharyngeal Swab  Result Value Ref Range Status   SARS Coronavirus 2 NEGATIVE NEGATIVE Final    Comment: (NOTE) SARS-CoV-2 target nucleic acids are NOT DETECTED.  The SARS-CoV-2 RNA is generally detectable in upper and lower respiratory specimens during the acute phase of infection. The lowest concentration of SARS-CoV-2 viral copies this assay can detect is 250 copies / mL. A negative result does not preclude SARS-CoV-2 infection and should not be used as the sole basis for treatment or other patient management decisions.  A negative result may occur with improper specimen collection / handling, submission of specimen other than nasopharyngeal swab, presence of viral mutation(s) within the areas targeted by this assay, and inadequate number of viral copies (<250 copies / mL). A negative result must be combined with clinical observations, patient history, and epidemiological information.  Fact Sheet for Patients:  StrictlyIdeas.no  Fact Sheet for Healthcare Providers: BankingDealers.co.za  This test is not yet approved or  cleared by the [place] FDA and has been authorized for  detection and/or diagnosis of SARS-CoV-2 by FDA under an Emergency Use Authorization (EUA).  This EUA will remain in effect (meaning this test can be used) for the duration of the COVID-19 declaration under Section 564(b)(1) of the Act, 21 U.S.C. section 360bbb-3(b)(1), unless the authorization is terminated or revoked sooner.  Performed at [facility], [place] [address], [place], [place] [address]      Labs: BNP (last 3 results) No results for input(s): BNP in the last 8760 hours. Basic Metabolic Panel: Recent Labs  Lab 03/16/20 1739  NA 136  K 4.3  CL 103  CO2 25  GLUCOSE 194*  BUN 26*  CREATININE 1.24  CALCIUM 8.6*   Liver Function Tests: Recent Labs  Lab 03/16/20 1739  AST 21  ALT 32  ALKPHOS 116  BILITOT 0.5  PROT 6.4*  ALBUMIN 3.2*   No results for input(s): LIPASE, AMYLASE in the last 168 hours. No results for input(s): AMMONIA in the last 168 hours. CBC: Recent Labs  Lab 03/16/20 1739  WBC 9.4  NEUTROABS 6.1  HGB 9.0*  HCT 29.3*  MCV 81.2  PLT 281   Cardiac Enzymes: No results for input(s): CKTOTAL, CKMB, CKMBINDEX, TROPONINI in the last 168 hours. BNP: Invalid input(s): POCBNP CBG: Recent Labs  Lab 03/17/20 0352 03/17/20 0739 03/17/20 0836 03/17/20 1108  GLUCAP 191* 103* 111* 250*   D-Dimer No results for input(s): DDIMER in the last 72 hours. Hgb A1c Recent Labs    03/16/20 1830  HGBA1C 8.7*   Lipid Profile Recent Labs    03/17/20 0447  CHOL 149  HDL 35*  LDLCALC 91  TRIG 113  CHOLHDL 4.3   Thyroid function studies No results for input(s): TSH, T4TOTAL, T3FREE, THYROIDAB in the last 72 hours.  Invalid input(s): FREET3 Anemia work up Recent Labs    03/17/20 1142  VITAMINB12 311  FOLATE 19.2  FERRITIN 14*  TIBC 357  IRON 70  RETICCTPCT 1.4   Urinalysis    Component Value Date/Time   COLORURINE YELLOW 03/16/2020 1836   APPEARANCEUR CLEAR 03/16/2020 1836   LABSPEC 1.019 03/16/2020 1836    PHURINE 5.0 03/16/2020 1836   GLUCOSEU 150 (A) 03/16/2020 1836   HGBUR NEGATIVE 03/16/2020 1836   BILIRUBINUR NEGATIVE 03/16/2020 1836   KETONESUR NEGATIVE 03/16/2020 1836   PROTEINUR NEGATIVE 03/16/2020 1836   NITRITE NEGATIVE 03/16/2020 1836   LEUKOCYTESUR NEGATIVE 03/16/2020 1836   Sepsis Labs Invalid input(s): PROCALCITONIN,  WBC,  LACTICIDVEN Microbiology Recent Results (from the past 240 hour(s))  SARS Coronavirus 2 by RT PCR (hospital order, performed in [facility] [place] hospital lab) Nasopharyngeal Nasopharyngeal Swab     Status: None   Collection Time: 03/16/20  5:39 PM   Specimen: Nasopharyngeal Swab  Result Value Ref Range Status   SARS Coronavirus 2 NEGATIVE NEGATIVE Final    Comment: (NOTE) SARS-CoV-2 target nucleic acids are NOT DETECTED.  The SARS-CoV-2 RNA is generally detectable in upper and lower respiratory specimens during the acute phase of infection. The lowest concentration of SARS-CoV-2 viral copies this assay can detect is 250 copies / mL. A negative result does not preclude SARS-CoV-2 infection and should not be used as the sole basis for treatment or other patient management decisions.  A negative result may occur with improper specimen collection / handling, submission of specimen other than  nasopharyngeal swab, presence of viral mutation(s) within the areas targeted by this assay, and inadequate number of viral copies (<250 copies / mL). A negative result must be combined with clinical observations, patient history, and epidemiological information.  Fact Sheet for Patients:   StrictlyIdeas.no  Fact Sheet for Healthcare Providers: BankingDealers.co.za  This test is not yet approved or  cleared by the [place] FDA and has been authorized for detection and/or diagnosis of SARS-CoV-2 by FDA under an Emergency Use Authorization (EUA).  This EUA will remain in effect (meaning this test can be used) for the  duration of the COVID-19 declaration under Section 564(b)(1) of the Act, 21 U.S.C. section 360bbb-3(b)(1), unless the authorization is terminated or revoked sooner.  Performed at [facility], [place] [address] [place], [place] [address]      Time coordinating discharge: 35 minutes  SIGNED:   Cordelia Poche, MD [facility] 03/17/2020, 3:45 PM

## 2020-03-17 NOTE — Progress Notes (Signed)
  Echocardiogram 2D Echocardiogram was attempted but PT was working with the patient. We will try again later.   Jennette Dubin 03/17/2020, 1:26 PM

## 2020-03-17 NOTE — Progress Notes (Signed)
STROKE TEAM PROGRESS NOTE   INTERVAL HISTORY His wife and son are at the bedside.  Pt lying in bed, no acute distress. He and his wife stated that last Monday he went to see his PCP and he had one of his dizzy episode, became very weak globally and pale and stayed in bed for a couple of days and then much improved. He is generally active person but recently found to have orthostatic hypotension with Dr. Leta Adkins. His PCP is changing his BP meds. I also encouraged him to monitor BP at home and record to help BP adjustment. Orthostatic vital is pending.   OBJECTIVE Vitals:   03/17/20 0710 03/17/20 0730 03/17/20 0858 03/17/20 0936  BP: (!) 144/88 136/72 138/82 130/67  Pulse: 97 73  96  Resp: 19 15  18   Temp:      TempSrc:      SpO2: 98% 97%  95%  Weight:      Height:        CBC:  Recent Labs  Lab 03/16/20 1739  WBC 9.4  NEUTROABS 6.1  HGB 9.0*  HCT 29.3*  MCV 81.2  PLT 696    Basic Metabolic Panel:  Recent Labs  Lab 03/16/20 1739  NA 136  K 4.3  CL 103  CO2 25  GLUCOSE 194*  BUN 26*  CREATININE 1.24  CALCIUM 8.6*    Lipid Panel:     Component Value Date/Time   CHOL 149 03/17/2020 0447   TRIG 113 03/17/2020 0447   HDL 35 (L) 03/17/2020 0447   CHOLHDL 4.3 03/17/2020 0447   VLDL 23 03/17/2020 0447   LDLCALC 91 03/17/2020 0447   HgbA1c:  Lab Results  Component Value Date   HGBA1C 8.7 (H) 03/16/2020   Urine Drug Screen:     Component Value Date/Time   LABOPIA NONE DETECTED 03/16/2020 1836   COCAINSCRNUR NONE DETECTED 03/16/2020 1836   LABBENZ POSITIVE (A) 03/16/2020 1836   AMPHETMU NONE DETECTED 03/16/2020 1836   THCU NONE DETECTED 03/16/2020 1836   LABBARB NONE DETECTED 03/16/2020 1836    Alcohol Level No results found for: ETH  IMAGING  CT ANGIO HEAD W OR WO CONTRAST CT ANGIO NECK W OR WO CONTRAST 03/16/2020 IMPRESSION:  1. No acute abnormality by CT. Small acute infarct left thalamus on MRI. There is atrophy and chronic microvascular ischemic  changes in the white matter.  2. Mild atherosclerotic disease in the carotid artery in the neck bilaterally without significant stenosis.  3. 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  4. No intracranial large vessel occlusion.    CT Head Wo Contrast 03/16/2020 IMPRESSION:  1. Subtle focal hypodensity of the left thalamus in keeping with acute diffusion restricting infarction seen on same day MR. No evidence of hemorrhage.  2. Advanced small-vessel white matter disease.   MR BRAIN/IAC W WO CONTRAST 03/16/2020 IMPRESSION:  9 mm acute left thalamic infarct. No cerebellopontine angle mass or internal auditory canal lesion is demonstrated. Unremarkable appearance of the seventh and eighth cranial nerves bilaterally. Moderate generalized parenchymal atrophy and chronic small vessel ischemic disease. 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 Indeterminate 12 mm lesion within the ventral aspect of the dens. Contrast-enhanced cervical spine MRI is recommended for further characterization, as clinically warranted. Mild ethmoid sinus mucosal thickening.   DG Chest 2 View 03/16/2020 IMPRESSION:  Normal study.   Transthoracic Echocardiogram  00/00/2021 Pending  ECG -  SR rate 73 BPM. (See cardiology reading for complete details)   PHYSICAL EXAM  Temp:  [97.4 F (36.3 C)-99 F (37.2 C)] 98.3 F (36.8 C) (08/08 1051) Pulse Rate:  [69-106] 99 (08/08 1051) Resp:  [11-23] 18 (08/08 1051) BP: (115-178)/(67-106) 170/76 (08/08 1051) SpO2:  [95 %-100 %] 100 % (08/08 1051) Weight:  [75.8 kg] 75.8 kg (08/07 1748)  General - Well nourished, well developed, in no apparent distress.  Ophthalmologic - fundi not visualized due to noncooperation.  Cardiovascular - Regular rhythm and rate.  Mental Status -  Level of arousal and orientation to time, place, and person were intact. Language including  expression, naming, repetition, comprehension was assessed and found intact. Fund of Knowledge was assessed and was intact.  Cranial Nerves II - XII - II - Visual field intact OU. III, IV, VI - Extraocular movements intact. V - Facial sensation intact bilaterally. VII - Facial movement intact bilaterally. VIII - Hearing & vestibular intact bilaterally. X - Palate elevates symmetrically. XI - Chin turning & shoulder shrug intact bilaterally. XII - Tongue protrusion intact.  Motor Strength - The patient's strength was normal in all extremities and pronator drift was absent.  Bulk was normal and fasciculations were absent.   Motor Tone - Muscle tone was assessed at the neck and appendages and was normal.  Reflexes - The patient's reflexes were symmetrical in all extremities and he had no pathological reflexes.  Sensory - Light touch, temperature/pinprick were assessed and were symmetrical.    Coordination - The patient had normal movements in the hands and feet with no ataxia or dysmetria.  Tremor was absent.  Gait and Station - deferred.   ASSESSMENT/PLAN [name] is a 79 y.o. male with history of hypertension, hyperlipidemia, diabetes mellitus who was seen at [facility] by Dr Thomas Adkins for episodes of dizziness/vertigo usually provoked from sitting to standing or bending down that started in March of this year -> Outpatient MRI 03/15/20 demonstrated a left thalamic infarct. He did not receive IV t-PA due to late presentation (>4.5 hours from time of onset).   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   CT head - Subtle focal hypodensity of the left thalamus in keeping with acute diffusion restricting infarction seen on same day MR.   MRI head W&WO - 9 mm acute left thalamic infarct. 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  CTA H&N - No acute  abnormality by CT. Moderate stenosis at the origin of the L VA origin and mild stenosis dominant R V4. BA patent.  2D Echo - pending  Thomas Adkins Virus 2 - negative  LDL - 91  HgbA1c - 8.7  UDS - benzo +  VTE prophylaxis - Lovenox  aspirin 81 mg daily prior to admission, now on aspirin 81 mg daily and clopidogrel 75 mg daily DAPT for 3 weeks and then plavix alone  Patient counseled to be compliant with his antithrombotic medications  Ongoing aggressive stroke risk factor management  Therapy recommendations:  outpt PT  Disposition:  Pending  Hypertension Orthostatic hypotension  Home BP meds: Norvasc ; irbesartan  Current BP meds: Norvasc ; Irbesartan  Stable  Orthostatic vital drops 22 points in 02/2020 with Dr. Leta Adkins . Orthostatic vital this time -  lying BP 147/72, sitting BP 126/103, standing BP 127/72 (drop 20 points) . Long-term BP goal normotensive . Encourage pt to monitor  BP at home and record and bring to PCP for BP meds management.  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 BA widely patent   Follows with Dr. Leta Adkins at [facility]  Hyperlipidemia  Home Lipid lowering medication: Zocor 20 mg daily  LDL 91, goal < 70  Current lipid lowering medication: Zocor 40 mg daily  Continue statin at discharge  Diabetes  Home diabetic meds: none   Current diabetic meds: SSI  HgbA1c 8.7, goal < 7.0  CBG monitoring  PCP follow up for better DM control  Other Stroke Risk Factors  Advanced age  Former cigarette smoker - quit  Previous strokes by imaging  Other Active Problems  Code status - Full code  Anemia - Hgb - 9.0   Indeterminate 12 mm lesion within the ventral aspect  of the dens. Contrast-enhanced cervical spine MRI is recommended for further characterization - follow up with Dr. [facility] day # 1  Neurology will sign off. Please call with questions. Pt will follow up with Dr. Leta Adkins at [facility] in about 3-4 weeks. Thanks for the consult.  Rosalin Hawking, MD PhD Stroke Neurology 03/17/2020 1:45 PM   To contact Stroke Continuity provider, please refer to [email or web address] After hours, contact General Neurology

## 2020-03-17 NOTE — ED Notes (Signed)
ED TO INPATIENT HANDOFF REPORT  ED Nurse Name and Phone #: [phone]  S Name/Age/Gender Thomas Adkins 79 y.o. male Room/Bed: RESA/RESA  Code Status   Code Status: Full Code  Home/SNF/Other Home Patient oriented to: self, place, time and situation Is this baseline? Yes   Triage Complete: Triage complete  Chief Complaint CVA (cerebral vascular accident) [facility]) [I63.9]  Triage Note Patient here from imaging center where he went for a MRI due to dizziness and nausea x1 month. MRI showed acute left thalamic infarct and referred to [facility]. Son brought patient here. Ambulatory with cane.     Allergies Allergies  Allergen Reactions  . Bactrim [Sulfamethoxazole-Trimethoprim] Other (See Comments)    Fatigue, dry mouth  . Hydrocodone Nausea And Vomiting  . Januvia [Sitagliptin] Other (See Comments)    fatigue  . Metformin And Related Nausea And Vomiting and Other (See Comments)    headache  . Norvasc [Amlodipine] Other (See Comments)    edema  . Primidone Nausea Only    Level of Care/Admitting Diagnosis ED Disposition    ED Disposition Condition [facility] Area: [place] [[id number]]  Level of Care: Telemetry Medical [104]  May admit patient to [facility] or [facility] if equivalent level of care is available:: No  Covid Evaluation: Asymptomatic Screening Protocol (No Symptoms)  Diagnosis: CVA (cerebral vascular accident) [facility]) [[id number]]  Admitting Physician: Neena Rhymes [[id number]]  Attending Physician: Adella Hare E [[id number]]  Estimated length of stay: past midnight tomorrow  Certification:: I certify this patient will need inpatient services for at least 2 midnights       B Medical/Surgery History Past Medical History:  Diagnosis Date  . Anxiety   . Arthritis   . Ataxia   . B12 deficiency   . Back ache    back problems  . Cataract   . Depression   . Diabetes ([place]) 06/23/2016   type 2  . Diabetic retinopathy ([place])   .  Dizziness   . Fatigue   . GERD (gastroesophageal reflux disease)   . Hearing loss   . High blood pressure 06/23/2016  . High cholesterol 06/23/2016  . History of kidney stones   . Insomnia   . Kidney stone   . Lumbar radiculopathy   . Osteoarthritis   . Osteomyelitis ([place])    L spine  . Polyneuropathy   . PUD (peptic ulcer disease)   . Tremor    Past Surgical History:  Procedure Laterality Date  . CATARACT EXTRACTION     bilateral  . EXTRACORPOREAL SHOCK WAVE LITHOTRIPSY Left 11/29/2017   Procedure: LEFT EXTRACORPOREAL SHOCK WAVE LITHOTRIPSY (ESWL);  Surgeon: Franchot Gallo, MD;  Location: [facility] ORS;  Service: Urology;  Laterality: Left;  . EYE SURGERY    . IR LUMBAR DISC ASPIRATION W/IMG GUIDE  09/09/2017  . JOINT REPLACEMENT     toe 40 yrs ago     A IV Location/Drains/Wounds Patient Lines/Drains/Airways Status    Active Line/Drains/Airways    Name Placement date Placement time Site Days   Peripheral IV 03/16/20 Right Antecubital 03/16/20  1747  Antecubital  1   Incision (Closed) 09/09/17 Back Mid;Lower 09/09/17  0953   920          Intake/Output Last 24 hours  Intake/Output Summary (Last 24 hours) at 03/17/2020 0845 Last data filed at 03/17/2020 0710 Gross per 24 hour  Intake 1120 ml  Output 850 ml  Net 270 ml    Labs/Imaging  Results for orders placed or performed during the hospital encounter of 03/16/20 (from the past 48 hour(s))  Protime-INR     Status: None   Collection Time: 03/16/20  5:39 PM  Result Value Ref Range   Prothrombin Time 12.9 11.4 - 15.2 seconds   INR 1.0 0.8 - 1.2    Comment: (NOTE) INR goal varies based on device and disease states. Performed at [facility], [place] [address] [place], [place] [address]   APTT     Status: None   Collection Time: 03/16/20  5:39 PM  Result Value Ref Range   aPTT 30 24 - 36 seconds    Comment: Performed at [facility], [place] [address], [place], [place] [address]   CBC     Status: Abnormal   Collection Time: 03/16/20  5:39 PM  Result Value Ref Range   WBC 9.4 4.0 - 10.5 K/uL   RBC 3.61 (L) 4.22 - 5.81 MIL/uL   Hemoglobin 9.0 (L) 13.0 - 17.0 g/dL   HCT 29.3 (L) 39 - 52 %   MCV 81.2 80.0 - 100.0 fL   MCH 24.9 (L) 26.0 - 34.0 pg   MCHC 30.7 30.0 - 36.0 g/dL   RDW 16.9 (H) 11.5 - 15.5 %   Platelets 281 150 - 400 K/uL   nRBC 0.0 0.0 - 0.2 %    Comment: Performed at [facility], [place] [address], [place], [place] [address]  Differential     Status: Abnormal   Collection Time: 03/16/20  5:39 PM  Result Value Ref Range   Neutrophils Relative % 63 %   Neutro Abs 6.1 1.7 - 7.7 K/uL   Lymphocytes Relative 21 %   Lymphs Abs 2.0 0.7 - 4.0 K/uL   Monocytes Relative 10 %   Monocytes Absolute 0.9 0 - 1 K/uL   Eosinophils Relative 3 %   Eosinophils Absolute 0.3 0 - 0 K/uL   Basophils Relative 1 %   Basophils Absolute 0.1 0 - 0 K/uL   Immature Granulocytes 2 %   Abs Immature Granulocytes 0.15 (H) 0.00 - 0.07 K/uL    Comment: Performed at [facility], [place] [address] [place], [place] [address]  Comprehensive metabolic panel     Status: Abnormal   Collection Time: 03/16/20  5:39 PM  Result Value Ref Range   Sodium 136 135 - 145 mmol/L   Potassium 4.3 3.5 - 5.1 mmol/L   Chloride 103 98 - 111 mmol/L   CO2 25 22 - 32 mmol/L   Glucose, Bld 194 (H) 70 - 99 mg/dL    Comment: Glucose reference range applies only to samples taken after fasting for at least 8 hours.   BUN 26 (H) 8 - 23 mg/dL   Creatinine, Ser 1.24 0.61 - 1.24 mg/dL   Calcium 8.6 (L) 8.9 - 10.3 mg/dL   Total Protein 6.4 (L) 6.5 - 8.1 g/dL   Albumin 3.2 (L) 3.5 - 5.0 g/dL   AST 21 15 - 41 U/L   ALT 32 0 - 44 U/L   Alkaline Phosphatase 116 38 - 126 U/L   Total Bilirubin 0.5 0.3 - 1.2 mg/dL   GFR calc non Af Amer 55 (L) >60 mL/min   GFR calc Af Amer >60 >60 mL/min   Anion gap 8 5 - 15    Comment: Performed at [facility], [place]  [address], [place], [place] [address]  SARS Coronavirus 2 by RT PCR (  hospital order, performed in [facility] hospital lab) Nasopharyngeal Nasopharyngeal Swab     Status: None   Collection Time: 03/16/20  5:39 PM   Specimen: Nasopharyngeal Swab  Result Value Ref Range   SARS Coronavirus 2 NEGATIVE NEGATIVE    Comment: (NOTE) SARS-CoV-2 target nucleic acids are NOT DETECTED.  The SARS-CoV-2 RNA is generally detectable in upper and lower respiratory specimens during the acute phase of infection. The lowest concentration of SARS-CoV-2 viral copies this assay can detect is 250 copies / mL. A negative result does not preclude SARS-CoV-2 infection and should not be used as the sole basis for treatment or other patient management decisions.  A negative result may occur with improper specimen collection / handling, submission of specimen other than nasopharyngeal swab, presence of viral mutation(s) within the areas targeted by this assay, and inadequate number of viral copies (<250 copies / mL). A negative result must be combined with clinical observations, patient history, and epidemiological information.  Fact Sheet for Patients:   StrictlyIdeas.no  Fact Sheet for Healthcare Providers: BankingDealers.co.za  This test is not yet approved or  cleared by the [place] FDA and has been authorized for detection and/or diagnosis of SARS-CoV-2 by FDA under an Emergency Use Authorization (EUA).  This EUA will remain in effect (meaning this test can be used) for the duration of the COVID-19 declaration under Section 564(b)(1) of the Act, 21 U.S.C. section 360bbb-3(b)(1), unless the authorization is terminated or revoked sooner.  Performed at [facility], [place] [address], [place], [place] [address]   Hemoglobin A1c     Status: Abnormal   Collection Time: 03/16/20  6:30 PM  Result Value Ref Range   Hgb A1c MFr Bld 8.7 (H) 4.8  - 5.6 %    Comment: (NOTE) Pre diabetes:          5.7%-6.4%  Diabetes:              >6.4%  Glycemic control for   <7.0% adults with diabetes    Mean Plasma Glucose 202.99 mg/dL    Comment: Performed at [place] [address], [place], [place] [address]  Urine rapid drug screen (hosp performed)     Status: Abnormal   Collection Time: 03/16/20  6:36 PM  Result Value Ref Range   Opiates NONE DETECTED NONE DETECTED   Cocaine NONE DETECTED NONE DETECTED   Benzodiazepines POSITIVE (A) NONE DETECTED   Amphetamines NONE DETECTED NONE DETECTED   Tetrahydrocannabinol NONE DETECTED NONE DETECTED   Barbiturates NONE DETECTED NONE DETECTED    Comment: (NOTE) DRUG SCREEN FOR MEDICAL PURPOSES ONLY.  IF CONFIRMATION IS NEEDED FOR ANY PURPOSE, NOTIFY LAB WITHIN 5 DAYS.  LOWEST DETECTABLE LIMITS FOR URINE DRUG SCREEN Drug Class                     Cutoff (ng/mL) Amphetamine and metabolites    1000 Barbiturate and metabolites    200 Benzodiazepine                 353 Tricyclics and metabolites     300 Opiates and metabolites        300 Cocaine and metabolites        300 THC                            50 Performed at [facility], [place] [address], [place], [place] [address]  Urinalysis, Routine w reflex microscopic Urine, Clean Catch     Status: Abnormal   Collection Time: 03/16/20  6:36 PM  Result Value Ref Range   Color, Urine YELLOW YELLOW   APPearance CLEAR CLEAR   Specific Gravity, Urine 1.019 1.005 - 1.030   pH 5.0 5.0 - 8.0   Glucose, UA 150 (A) NEGATIVE mg/dL   Hgb urine dipstick NEGATIVE NEGATIVE   Bilirubin Urine NEGATIVE NEGATIVE   Ketones, ur NEGATIVE NEGATIVE mg/dL   Protein, ur NEGATIVE NEGATIVE mg/dL   Nitrite NEGATIVE NEGATIVE   Leukocytes,Ua NEGATIVE NEGATIVE    Comment: Performed at [place] [address], [place], [place] [address]  CBG monitoring, ED     Status: Abnormal   Collection Time: 03/17/20  3:52 AM   Result Value Ref Range   Glucose-Capillary 191 (H) 70 - 99 mg/dL    Comment: Glucose reference range applies only to samples taken after fasting for at least 8 hours.   Comment 1 Notify RN   Lipid panel     Status: Abnormal   Collection Time: 03/17/20  4:47 AM  Result Value Ref Range   Cholesterol 149 0 - 200 mg/dL   Triglycerides 113 <150 mg/dL   HDL 35 (L) >40 mg/dL   Total CHOL/HDL Ratio 4.3 RATIO   VLDL 23 0 - 40 mg/dL   LDL Cholesterol 91 0 - 99 mg/dL    Comment:        Total Cholesterol/HDL:CHD Risk Coronary Heart Disease Risk Table                     Men   Women  1/2 Average Risk   3.4   3.3  Average Risk       5.0   4.4  2 X Average Risk   9.6   7.1  3 X Average Risk  23.4   11.0        Use the calculated Patient Ratio above and the CHD Risk Table to determine the patient's CHD Risk.        ATP III CLASSIFICATION (LDL):  <100     mg/dL   Optimal  100-129  mg/dL   Near or Above                    Optimal  130-159  mg/dL   Borderline  160-189  mg/dL   High  >190     mg/dL   Very High Performed at [place] [address] [place], [place] [address]   CBG monitoring, ED     Status: Abnormal   Collection Time: 03/17/20  7:39 AM  Result Value Ref Range   Glucose-Capillary 103 (H) 70 - 99 mg/dL    Comment: Glucose reference range applies only to samples taken after fasting for at least 8 hours.  CBG monitoring, ED     Status: Abnormal   Collection Time: 03/17/20  8:36 AM  Result Value Ref Range   Glucose-Capillary 111 (H) 70 - 99 mg/dL    Comment: Glucose reference range applies only to samples taken after fasting for at least 8 hours.   CT ANGIO HEAD W OR WO CONTRAST  Result Date: 03/16/2020 CLINICAL DATA:  Stroke. Dizziness and nausea 1 month. Left thalamic infarct on MRI today. EXAM: CT ANGIOGRAPHY HEAD AND NECK TECHNIQUE: Multidetector CT imaging of the head and neck was performed using the standard protocol during bolus administration of  intravenous contrast.  Multiplanar CT image reconstructions and MIPs were obtained to evaluate the vascular anatomy. Carotid stenosis measurements (when applicable) are obtained utilizing NASCET criteria, using the distal internal carotid diameter as the denominator. CONTRAST:  145mL OMNIPAQUE IOHEXOL 350 MG/ML SOLN COMPARISON:  MRI head 03/16/2020 FINDINGS: CT HEAD FINDINGS Brain: Mild to moderate atrophy. Moderate white matter hypodensity diffusely and bilaterally consistent with chronic microvascular ischemia. Small acute infarct in the left thalamus on MRI is not visualized by CT of this point. No acute infarct or hemorrhage. No mass lesion identified. Vascular: Normal arterial flow voids. Skull: Negative Sinuses: Paranasal sinuses clear. Orbits: Bilateral cataract extraction. Review of the MIP images confirms the above findings CTA NECK FINDINGS Aortic arch: Standard branching. Imaged portion shows no evidence of aneurysm or dissection. No significant stenosis of the major arch vessel origins. Right carotid system: Right carotid bifurcation widely patent. Approximately 1 cm above the bifurcation there is a calcified plaque in the right internal carotid artery narrowing the lumen by less than 25% diameter stenosis. Left carotid system: Minimal atherosclerotic calcification left carotid bifurcation without stenosis Vertebral arteries: Right vertebral artery dominant and patent to the basilar. Calcified plaque distal right vertebral artery with moderate stenosis. Moderate calcific stenosis origin of left vertebral artery mild stenosis distal left vertebral artery. Left vertebral artery is continuous to the basilar. Skeleton: Cervical spondylosis.  No acute skeletal abnormality. Other neck: Negative Upper chest: Lung apices clear bilaterally. Review of the MIP images confirms the above findings CTA HEAD FINDINGS Anterior circulation: Mild atherosclerotic calcification in the cavernous carotid bilaterally without  significant stenosis. Anterior and middle cerebral arteries patent bilaterally without stenosis. Posterior circulation: Moderate calcific stenosis distal right vertebral artery the skull base. Mild stenosis distal left vertebral artery. PICA patent bilaterally. Basilar widely patent. Small AICA patent bilaterally. Superior cerebellar and posterior cerebral arteries patent bilaterally. No significant stenosis or large vessel occlusion. Venous sinuses: Normal venous enhancement Anatomic variants: None Review of the MIP images confirms the above findings IMPRESSION: 1. No acute abnormality by CT. Small acute infarct left thalamus on MRI. There is atrophy and chronic microvascular ischemic changes in the white matter. 2. Mild atherosclerotic disease in the carotid artery in the neck bilaterally without significant stenosis. 3. 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 4. No intracranial large vessel occlusion. Electronically Signed   By: Franchot Gallo M.D.   On: 03/16/2020 20:40   DG Chest 2 View  Result Date: 03/16/2020 CLINICAL DATA:  Dizziness, nausea EXAM: CHEST - 2 VIEW COMPARISON:  None. FINDINGS: The heart size and mediastinal contours are within normal limits. Both lungs are clear. The visualized skeletal structures are unremarkable. IMPRESSION: Normal study. Electronically Signed   By: Rolm Baptise M.D.   On: 03/16/2020 19:46   CT Head Wo Contrast  Result Date: 03/16/2020 CLINICAL DATA:  Follow-up stroke, dizziness EXAM: CT HEAD WITHOUT CONTRAST TECHNIQUE: Contiguous axial images were obtained from the base of the skull through the vertex without intravenous contrast. COMPARISON:  Same day MR brain, 03/16/2020 FINDINGS: Brain: Extensive periventricular and deep white matter hypodensity. Subtle focal hypodensity of the left thalamus in keeping with acute diffusion restricting infarction seen on same day MR (series 2, image 16). No  evidence of hemorrhage, hydrocephalus, extra-axial collection or mass lesion/mass effect. Vascular: No hyperdense vessel or unexpected calcification. Skull: Normal. Negative for fracture or focal lesion. Sinuses/Orbits: No acute finding. Other: None. IMPRESSION: 1. Subtle focal hypodensity of the left thalamus  in keeping with acute diffusion restricting infarction seen on same day MR. No evidence of hemorrhage. 2. Advanced small-vessel white matter disease. Electronically Signed   By: Eddie Candle M.D.   On: 03/16/2020 17:02   CT ANGIO NECK W OR WO CONTRAST  Result Date: 03/16/2020 CLINICAL DATA:  Stroke. Dizziness and nausea 1 month. Left thalamic infarct on MRI today. EXAM: CT ANGIOGRAPHY HEAD AND NECK TECHNIQUE: Multidetector CT imaging of the head and neck was performed using the standard protocol during bolus administration of intravenous contrast. Multiplanar CT image reconstructions and MIPs were obtained to evaluate the vascular anatomy. Carotid stenosis measurements (when applicable) are obtained utilizing NASCET criteria, using the distal internal carotid diameter as the denominator. CONTRAST:  145mL OMNIPAQUE IOHEXOL 350 MG/ML SOLN COMPARISON:  MRI head 03/16/2020 FINDINGS: CT HEAD FINDINGS Brain: Mild to moderate atrophy. Moderate white matter hypodensity diffusely and bilaterally consistent with chronic microvascular ischemia. Small acute infarct in the left thalamus on MRI is not visualized by CT of this point. No acute infarct or hemorrhage. No mass lesion identified. Vascular: Normal arterial flow voids. Skull: Negative Sinuses: Paranasal sinuses clear. Orbits: Bilateral cataract extraction. Review of the MIP images confirms the above findings CTA NECK FINDINGS Aortic arch: Standard branching. Imaged portion shows no evidence of aneurysm or dissection. No significant stenosis of the major arch vessel origins. Right carotid system: Right carotid bifurcation widely patent. Approximately 1 cm above  the bifurcation there is a calcified plaque in the right internal carotid artery narrowing the lumen by less than 25% diameter stenosis. Left carotid system: Minimal atherosclerotic calcification left carotid bifurcation without stenosis Vertebral arteries: Right vertebral artery dominant and patent to the basilar. Calcified plaque distal right vertebral artery with moderate stenosis. Moderate calcific stenosis origin of left vertebral artery mild stenosis distal left vertebral artery. Left vertebral artery is continuous to the basilar. Skeleton: Cervical spondylosis.  No acute skeletal abnormality. Other neck: Negative Upper chest: Lung apices clear bilaterally. Review of the MIP images confirms the above findings CTA HEAD FINDINGS Anterior circulation: Mild atherosclerotic calcification in the cavernous carotid bilaterally without significant stenosis. Anterior and middle cerebral arteries patent bilaterally without stenosis. Posterior circulation: Moderate calcific stenosis distal right vertebral artery the skull base. Mild stenosis distal left vertebral artery. PICA patent bilaterally. Basilar widely patent. Small AICA patent bilaterally. Superior cerebellar and posterior cerebral arteries patent bilaterally. No significant stenosis or large vessel occlusion. Venous sinuses: Normal venous enhancement Anatomic variants: None Review of the MIP images confirms the above findings IMPRESSION: 1. No acute abnormality by CT. Small acute infarct left thalamus on MRI. There is atrophy and chronic microvascular ischemic changes in the white matter. 2. Mild atherosclerotic disease in the carotid artery in the neck bilaterally without significant stenosis. 3. 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 4. No intracranial large vessel occlusion. Electronically Signed   By: Franchot Gallo M.D.   On: 03/16/2020 20:40   MR BRAIN/IAC W WO CONTRAST  Result  Date: 03/16/2020 CLINICAL DATA:  Vertigo of central origin. Dizziness; vertigo, central. Additional history provided by scanning technologist: Patient reports 3-4 months of dizziness and nausea. EXAM: MRI HEAD WITHOUT AND WITH CONTRAST TECHNIQUE: Multiplanar, multiecho pulse sequences of the brain and surrounding structures were obtained without and with intravenous contrast. CONTRAST:  56mL MULTIHANCE GADOBENATE DIMEGLUMINE 529 MG/ML IV SOLN COMPARISON:  CT cervical spine 06/05/2017. FINDINGS: Brain: Moderate generalized parenchymal atrophy. 9 mm focus of restricted diffusion consistent  with acute infarction within the left thalamus. Moderate patchy T2/FLAIR hyperintensity within the cerebral white matter is nonspecific, but consistent with chronic small vessel ischemic disease. 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 No intracranial mass is identified. Specifically, no cerebellopontine angle mass or internal auditory canal lesion is demonstrated. Unremarkable appearance of the seventh and eighth cranial nerves bilaterally. No abnormal intracranial enhancement. There is no acute infarct. No chronic intracranial blood products. No extra-axial fluid collection. No midline shift. Vascular: Expected proximal arterial flow voids. Skull and upper cervical spine: 12 mm T1 hypointense lesion within the ventral aspect of the dens (series 5, image 13) (series 14, image 2). There is no appreciable lesion at this site on the prior CT cervical spine of 06/05/2017. Sinuses/Orbits: Bilateral lens replacements. Mild ethmoid sinus mucosal thickening. No significant mastoid effusion. 9 mm acute left thalamic infarct. These results were called by telephone at the time of interpretation on 03/16/2020 at 3:51 pm to provider Dr. Floyde Parkins, who verbally acknowledged these results. Indeterminate 12 mm lesion within the dens. These results will be called to the ordering clinician or representative by  the Radiologist Assistant, and communication documented in the PACS or [employer]. IMPRESSION: 9 mm acute left thalamic infarct. No cerebellopontine angle mass or internal auditory canal lesion is demonstrated. Unremarkable appearance of the seventh and eighth cranial nerves bilaterally. Moderate generalized parenchymal atrophy and chronic small vessel ischemic disease. 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 Indeterminate 12 mm lesion within the ventral aspect of the dens. Contrast-enhanced cervical spine MRI is recommended for further characterization, as clinically warranted. Mild ethmoid sinus mucosal thickening. Electronically Signed   By: Kellie Simmering DO   On: 03/16/2020 15:59    Pending Labs Unresulted Labs (From admission, onward) Comment          Start     Ordered   03/23/20 0500  Creatinine, serum  (enoxaparin (LOVENOX)    CrCl >/= 30 ml/min)  Weekly,   R     Comments: while on enoxaparin therapy    03/16/20 1906          Vitals/Pain Today's Vitals   03/17/20 0530 03/17/20 0710 03/17/20 0730 03/17/20 0740  BP: (!) 154/98 (!) 144/88 136/72   Pulse: 79 97 73   Resp: 17 19 15    Temp:      TempSrc:      SpO2: 96% 98% 97%   Weight:      Height:      PainSc:    0-No pain    Isolation Precautions No active isolations  Medications Medications   stroke: mapping our early stages of recovery book (has no administration in time range)  0.9 %  sodium chloride infusion ( Intravenous Stopped 03/17/20 0710)  acetaminophen (TYLENOL) tablet 650 mg (has no administration in time range)    Or  acetaminophen (TYLENOL) 160 MG/5ML solution 650 mg (has no administration in time range)    Or  acetaminophen (TYLENOL) suppository 650 mg (has no administration in time range)  senna-docusate (Senokot-S) tablet 1 tablet (has no administration in time range)  enoxaparin (LOVENOX) injection 40 mg (40 mg Subcutaneous Given 03/17/20 0239)  aspirin  suppository 300 mg ( Rectal See Alternative 03/17/20 0238)    Or  aspirin tablet 325 mg (325 mg Oral Given 03/17/20 0238)  traMADol (ULTRAM) tablet 50 mg (has no administration in time range)  amLODipine (NORVASC) tablet 2.5 mg (2.5 mg Oral Given 03/17/20  4158)  irbesartan (AVAPRO) tablet 150 mg (has no administration in time range)  simvastatin (ZOCOR) tablet 20 mg (20 mg Oral Given 03/17/20 0239)  metoCLOPramide (REGLAN) tablet 5 mg (has no administration in time range)  pantoprazole (PROTONIX) EC tablet 40 mg (40 mg Oral Given 03/17/20 0237)  scopolamine (TRANSDERM-SCOP) 1 MG/3DAYS 1.5 mg (has no administration in time range)  sucralfate (CARAFATE) tablet 1 g (has no administration in time range)  ferrous gluconate (FERGON) tablet 324 mg (has no administration in time range)  clopidogrel (PLAVIX) tablet 75 mg (75 mg Oral Given 03/17/20 0238)  hydrALAZINE (APRESOLINE) tablet 10 mg (has no administration in time range)  insulin aspart (novoLOG) injection 0-15 Units (0 Units Subcutaneous Not Given 03/17/20 0741)  naproxen (NAPROSYN) tablet 500 mg (has no administration in time range)  iohexol (OMNIPAQUE) 350 MG/ML injection 100 mL (100 mLs Intravenous Contrast Given 03/16/20 1952)    Mobility walks with device Low fall risk   Focused Assessments .   R Recommendations: See Admitting Provider Note  Report given to:   Additional Notes: n/a

## 2020-03-18 ENCOUNTER — Other Ambulatory Visit: Payer: Self-pay

## 2020-03-18 DIAGNOSIS — R937 Abnormal findings on diagnostic imaging of other parts of musculoskeletal system: Secondary | ICD-10-CM | POA: Insufficient documentation

## 2020-03-18 NOTE — Telephone Encounter (Signed)
IMPRESSION: 9 mm acute left thalamic infarct.  No cerebellopontine angle mass or internal auditory canal lesion is demonstrated. Unremarkable appearance of the seventh and eighth cranial nerves bilaterally.  Moderate generalized parenchymal atrophy and chronic small vessel ischemic disease. 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  Indeterminate 12 mm lesion within the ventral aspect of the dens. Contrast-enhanced cervical spine MRI is recommended for further characterization, as clinically warranted.  Please call patient, I have ordered MRI of cervical spine with without contrast per suggestion by radiologist, please make it happen fairly soon,  Patient's creatinine was at the upper limit of normal range, make sure he drinks a lot of water prior to and after MRI contrast  Orders Placed This Encounter  Procedures  . MR CERVICAL SPINE W WO CONTRAST

## 2020-03-18 NOTE — Telephone Encounter (Signed)
Thomas Adkins from [facility] radiology called to verify if MD reviewed MRI results on 03/16/2020. I advised Dr. Jannifer Franklin did call pt about the evidence of an acute left thalamic stroke and was admitted to the ED on 03/16/2020.  Thomas Adkins states the radiologist asked that she call this morning to verify but also advise there was an indeterminate 12 mm mass seen in the ventral aspect of the dens.   The recommendation is for MRI of cervical spine with contrast be completed.  This was all documented in the report from Dr. Hilma Favors on 03/16/2020. Pt was admitted on the hsp on 03/16/2020 at discharged on 03/17/2020.

## 2020-03-18 NOTE — Addendum Note (Signed)
Addended by: Marcial Pacas on: 03/18/2020 11:31 AM   Modules accepted: Orders

## 2020-03-18 NOTE — Telephone Encounter (Signed)
Cancel MRA of neck and head, since he already had CTA of head and neck, but he does need MRI cervical w/wo soon

## 2020-03-18 NOTE — Telephone Encounter (Signed)
Mcarthur Rossetti Josem Kaufmann: [phone] (exp. 03/18/20 to 04/17/20) I sent an email to Olin Hauser at [place] about scheduling him as soon as possible and also for them to cancel his MRA's since he already had CTA's.

## 2020-03-18 NOTE — Telephone Encounter (Signed)
Noted, [employer] pending uploaded notes .

## 2020-03-18 NOTE — Patient Outreach (Signed)
[facility]) Care Management  03/18/2020  Nazario Russom 20-Jun-1941 [id number]   Referral Date: 03/18/20 Referral Source: [employer] Report Date of Discharge: 03/17/20 Facility:  [place]: [facility]   Referral received.  No outreach warranted at this time.  Transition of Care calls being completed via EMMI. RN CM will outreach patient for any red flags received.    Plan: RN CM will close case.    Jone Baseman, RN, MSN [facility] Care Management Care Management Coordinator Direct Line [phone] Toll Free: [phone]  Fax: [phone]

## 2020-03-18 NOTE — Telephone Encounter (Signed)
This is a patient of Dr. Leta Baptist, I will send the note to him.

## 2020-03-18 NOTE — Telephone Encounter (Signed)
Right now patient is scheduled for a MRA Head and Neck. Does he still need to have those since he has had CTA head and Neck??

## 2020-03-19 ENCOUNTER — Encounter (HOSPITAL_COMMUNITY): Payer: Medicare HMO | Admitting: Physical Therapy

## 2020-03-19 DIAGNOSIS — I351 Nonrheumatic aortic (valve) insufficiency: Secondary | ICD-10-CM | POA: Diagnosis not present

## 2020-03-19 DIAGNOSIS — E1142 Type 2 diabetes mellitus with diabetic polyneuropathy: Secondary | ICD-10-CM | POA: Diagnosis not present

## 2020-03-19 DIAGNOSIS — I6322 Cerebral infarction due to unspecified occlusion or stenosis of basilar arteries: Secondary | ICD-10-CM | POA: Diagnosis not present

## 2020-03-19 DIAGNOSIS — J069 Acute upper respiratory infection, unspecified: Secondary | ICD-10-CM | POA: Diagnosis not present

## 2020-03-19 DIAGNOSIS — I69398 Other sequelae of cerebral infarction: Secondary | ICD-10-CM | POA: Diagnosis not present

## 2020-03-19 DIAGNOSIS — I6503 Occlusion and stenosis of bilateral vertebral arteries: Secondary | ICD-10-CM | POA: Diagnosis not present

## 2020-03-19 DIAGNOSIS — I63212 Cerebral infarction due to unspecified occlusion or stenosis of left vertebral arteries: Secondary | ICD-10-CM | POA: Diagnosis not present

## 2020-03-19 DIAGNOSIS — M6281 Muscle weakness (generalized): Secondary | ICD-10-CM | POA: Diagnosis not present

## 2020-03-19 DIAGNOSIS — Z299 Encounter for prophylactic measures, unspecified: Secondary | ICD-10-CM | POA: Diagnosis not present

## 2020-03-19 DIAGNOSIS — E785 Hyperlipidemia, unspecified: Secondary | ICD-10-CM | POA: Diagnosis not present

## 2020-03-19 DIAGNOSIS — I639 Cerebral infarction, unspecified: Secondary | ICD-10-CM | POA: Diagnosis not present

## 2020-03-19 DIAGNOSIS — I10 Essential (primary) hypertension: Secondary | ICD-10-CM | POA: Diagnosis not present

## 2020-03-19 DIAGNOSIS — R42 Dizziness and giddiness: Secondary | ICD-10-CM | POA: Diagnosis not present

## 2020-03-19 DIAGNOSIS — D649 Anemia, unspecified: Secondary | ICD-10-CM | POA: Diagnosis not present

## 2020-03-19 NOTE — Telephone Encounter (Signed)
Patient is scheduled at GI for 03/20/20.

## 2020-03-20 ENCOUNTER — Ambulatory Visit
Admission: RE | Admit: 2020-03-20 | Discharge: 2020-03-20 | Disposition: A | Discharge status: Home / Self Care | Payer: Medicare HMO | Source: Ambulatory Visit | Attending: Neurology | Admitting: Neurology

## 2020-03-20 DIAGNOSIS — R937 Abnormal findings on diagnostic imaging of other parts of musculoskeletal system: Secondary | ICD-10-CM

## 2020-03-20 MED ORDER — GADOBENATE DIMEGLUMINE 529 MG/ML IV SOLN
15.0000 mL | Freq: Once | INTRAVENOUS | Status: AC | PRN
  Administered 2020-03-20: 15 mL via INTRAVENOUS

## 2020-03-21 ENCOUNTER — Encounter (HOSPITAL_COMMUNITY): Payer: Medicare HMO | Admitting: Physical Therapy

## 2020-03-21 ENCOUNTER — Ambulatory Visit (INDEPENDENT_AMBULATORY_CARE_PROVIDER_SITE_OTHER): Payer: Medicare HMO | Admitting: Gastroenterology

## 2020-03-21 DIAGNOSIS — U071 COVID-19: Secondary | ICD-10-CM | POA: Diagnosis not present

## 2020-03-21 DIAGNOSIS — K8500 Idiopathic acute pancreatitis without necrosis or infection: Secondary | ICD-10-CM | POA: Diagnosis not present

## 2020-03-21 DIAGNOSIS — N281 Cyst of kidney, acquired: Secondary | ICD-10-CM | POA: Diagnosis not present

## 2020-03-21 DIAGNOSIS — K859 Acute pancreatitis without necrosis or infection, unspecified: Secondary | ICD-10-CM | POA: Diagnosis not present

## 2020-03-21 DIAGNOSIS — E119 Type 2 diabetes mellitus without complications: Secondary | ICD-10-CM | POA: Diagnosis not present

## 2020-03-21 DIAGNOSIS — E869 Volume depletion, unspecified: Secondary | ICD-10-CM | POA: Diagnosis not present

## 2020-03-21 DIAGNOSIS — Z794 Long term (current) use of insulin: Secondary | ICD-10-CM | POA: Diagnosis not present

## 2020-03-21 DIAGNOSIS — I700 Atherosclerosis of aorta: Secondary | ICD-10-CM | POA: Diagnosis not present

## 2020-03-21 DIAGNOSIS — R112 Nausea with vomiting, unspecified: Secondary | ICD-10-CM | POA: Diagnosis not present

## 2020-03-21 DIAGNOSIS — N200 Calculus of kidney: Secondary | ICD-10-CM | POA: Diagnosis not present

## 2020-03-21 DIAGNOSIS — R739 Hyperglycemia, unspecified: Secondary | ICD-10-CM | POA: Diagnosis not present

## 2020-03-21 DIAGNOSIS — J1282 Pneumonia due to coronavirus disease 2019: Secondary | ICD-10-CM | POA: Diagnosis not present

## 2020-03-21 DIAGNOSIS — K5730 Diverticulosis of large intestine without perforation or abscess without bleeding: Secondary | ICD-10-CM | POA: Diagnosis not present

## 2020-03-21 DIAGNOSIS — R42 Dizziness and giddiness: Secondary | ICD-10-CM | POA: Diagnosis not present

## 2020-03-21 DIAGNOSIS — J9600 Acute respiratory failure, unspecified whether with hypoxia or hypercapnia: Secondary | ICD-10-CM | POA: Diagnosis not present

## 2020-03-21 DIAGNOSIS — I10 Essential (primary) hypertension: Secondary | ICD-10-CM | POA: Diagnosis not present

## 2020-03-21 DIAGNOSIS — E1165 Type 2 diabetes mellitus with hyperglycemia: Secondary | ICD-10-CM | POA: Diagnosis not present

## 2020-03-21 DIAGNOSIS — K6389 Other specified diseases of intestine: Secondary | ICD-10-CM | POA: Diagnosis not present

## 2020-03-21 DIAGNOSIS — E785 Hyperlipidemia, unspecified: Secondary | ICD-10-CM | POA: Diagnosis not present

## 2020-03-21 DIAGNOSIS — E11649 Type 2 diabetes mellitus with hypoglycemia without coma: Secondary | ICD-10-CM | POA: Diagnosis not present

## 2020-03-21 DIAGNOSIS — Z299 Encounter for prophylactic measures, unspecified: Secondary | ICD-10-CM | POA: Diagnosis not present

## 2020-03-21 DIAGNOSIS — E10649 Type 1 diabetes mellitus with hypoglycemia without coma: Secondary | ICD-10-CM | POA: Diagnosis not present

## 2020-03-21 DIAGNOSIS — M549 Dorsalgia, unspecified: Secondary | ICD-10-CM | POA: Diagnosis not present

## 2020-03-21 DIAGNOSIS — E162 Hypoglycemia, unspecified: Secondary | ICD-10-CM | POA: Diagnosis not present

## 2020-03-25 ENCOUNTER — Encounter (HOSPITAL_COMMUNITY): Payer: Medicare HMO | Admitting: Physical Therapy

## 2020-03-27 ENCOUNTER — Encounter (HOSPITAL_COMMUNITY): Payer: Medicare HMO | Admitting: Physical Therapy

## 2020-03-27 DIAGNOSIS — R42 Dizziness and giddiness: Secondary | ICD-10-CM | POA: Diagnosis not present

## 2020-03-27 DIAGNOSIS — M6281 Muscle weakness (generalized): Secondary | ICD-10-CM | POA: Diagnosis not present

## 2020-03-27 DIAGNOSIS — I10 Essential (primary) hypertension: Secondary | ICD-10-CM | POA: Diagnosis not present

## 2020-03-27 DIAGNOSIS — I6503 Occlusion and stenosis of bilateral vertebral arteries: Secondary | ICD-10-CM | POA: Diagnosis not present

## 2020-03-27 DIAGNOSIS — E785 Hyperlipidemia, unspecified: Secondary | ICD-10-CM | POA: Diagnosis not present

## 2020-03-27 DIAGNOSIS — I69398 Other sequelae of cerebral infarction: Secondary | ICD-10-CM | POA: Diagnosis not present

## 2020-03-27 DIAGNOSIS — I351 Nonrheumatic aortic (valve) insufficiency: Secondary | ICD-10-CM | POA: Diagnosis not present

## 2020-03-27 DIAGNOSIS — D649 Anemia, unspecified: Secondary | ICD-10-CM | POA: Diagnosis not present

## 2020-03-27 DIAGNOSIS — E1142 Type 2 diabetes mellitus with diabetic polyneuropathy: Secondary | ICD-10-CM | POA: Diagnosis not present

## 2020-03-28 ENCOUNTER — Other Ambulatory Visit: Payer: Self-pay

## 2020-03-28 ENCOUNTER — Ambulatory Visit: Payer: Medicare HMO | Admitting: Neurology

## 2020-03-28 DIAGNOSIS — I10 Essential (primary) hypertension: Secondary | ICD-10-CM | POA: Diagnosis not present

## 2020-03-28 DIAGNOSIS — R42 Dizziness and giddiness: Secondary | ICD-10-CM | POA: Diagnosis not present

## 2020-03-28 DIAGNOSIS — D649 Anemia, unspecified: Secondary | ICD-10-CM | POA: Diagnosis not present

## 2020-03-28 DIAGNOSIS — E1142 Type 2 diabetes mellitus with diabetic polyneuropathy: Secondary | ICD-10-CM | POA: Diagnosis not present

## 2020-03-28 DIAGNOSIS — I6503 Occlusion and stenosis of bilateral vertebral arteries: Secondary | ICD-10-CM | POA: Diagnosis not present

## 2020-03-28 DIAGNOSIS — E785 Hyperlipidemia, unspecified: Secondary | ICD-10-CM | POA: Diagnosis not present

## 2020-03-28 DIAGNOSIS — M6281 Muscle weakness (generalized): Secondary | ICD-10-CM | POA: Diagnosis not present

## 2020-03-28 DIAGNOSIS — I351 Nonrheumatic aortic (valve) insufficiency: Secondary | ICD-10-CM | POA: Diagnosis not present

## 2020-03-28 DIAGNOSIS — I69398 Other sequelae of cerebral infarction: Secondary | ICD-10-CM | POA: Diagnosis not present

## 2020-03-28 NOTE — Patient Outreach (Signed)
[facility]) Care Management  03/28/2020  Thomas Adkins 1941/06/12 [id number]   Referral Date: 03/28/20 Referral Source: [employer] Report Date of Discharge: 03/18/20 Facility:  [place]: [facility]   Referral received. No outreach warranted at this time. Transition of Care  will be completed by primary care provider office who will refer to [facility] care management if needed.  Plan: RN CM will close case.  Thomas Baseman, RN, MSN [place] Management Care Management Coordinator Direct Line [phone] Cell [phone] Toll Free: [phone]  Fax: [phone]

## 2020-03-29 DIAGNOSIS — E1142 Type 2 diabetes mellitus with diabetic polyneuropathy: Secondary | ICD-10-CM | POA: Diagnosis not present

## 2020-03-29 DIAGNOSIS — Z299 Encounter for prophylactic measures, unspecified: Secondary | ICD-10-CM | POA: Diagnosis not present

## 2020-03-29 DIAGNOSIS — U071 COVID-19: Secondary | ICD-10-CM | POA: Diagnosis not present

## 2020-03-29 DIAGNOSIS — M6281 Muscle weakness (generalized): Secondary | ICD-10-CM | POA: Diagnosis not present

## 2020-03-29 DIAGNOSIS — E1165 Type 2 diabetes mellitus with hyperglycemia: Secondary | ICD-10-CM | POA: Diagnosis not present

## 2020-03-29 DIAGNOSIS — I69398 Other sequelae of cerebral infarction: Secondary | ICD-10-CM | POA: Diagnosis not present

## 2020-03-29 DIAGNOSIS — R42 Dizziness and giddiness: Secondary | ICD-10-CM | POA: Diagnosis not present

## 2020-03-29 DIAGNOSIS — F1120 Opioid dependence, uncomplicated: Secondary | ICD-10-CM | POA: Diagnosis not present

## 2020-03-29 DIAGNOSIS — I10 Essential (primary) hypertension: Secondary | ICD-10-CM | POA: Diagnosis not present

## 2020-03-30 ENCOUNTER — Emergency Department (HOSPITAL_COMMUNITY): Payer: Medicare HMO

## 2020-03-30 ENCOUNTER — Other Ambulatory Visit: Payer: Medicare HMO

## 2020-03-30 ENCOUNTER — Other Ambulatory Visit: Payer: Self-pay

## 2020-03-30 ENCOUNTER — Encounter (HOSPITAL_COMMUNITY): Payer: Medicare HMO

## 2020-03-30 ENCOUNTER — Inpatient Hospital Stay (HOSPITAL_COMMUNITY): Payer: Medicare HMO

## 2020-03-30 ENCOUNTER — Inpatient Hospital Stay (HOSPITAL_COMMUNITY)
Admission: EM | Admit: 2020-03-30 | Discharge: 2020-04-10 | DRG: 871 | Disposition: E | Payer: Medicare HMO | Attending: Internal Medicine | Admitting: Internal Medicine

## 2020-03-30 DIAGNOSIS — J159 Unspecified bacterial pneumonia: Secondary | ICD-10-CM | POA: Diagnosis present

## 2020-03-30 DIAGNOSIS — I6503 Occlusion and stenosis of bilateral vertebral arteries: Secondary | ICD-10-CM | POA: Diagnosis not present

## 2020-03-30 DIAGNOSIS — R6520 Severe sepsis without septic shock: Secondary | ICD-10-CM

## 2020-03-30 DIAGNOSIS — D649 Anemia, unspecified: Secondary | ICD-10-CM | POA: Diagnosis not present

## 2020-03-30 DIAGNOSIS — Z87891 Personal history of nicotine dependence: Secondary | ICD-10-CM

## 2020-03-30 DIAGNOSIS — Z515 Encounter for palliative care: Secondary | ICD-10-CM | POA: Diagnosis not present

## 2020-03-30 DIAGNOSIS — T380X5A Adverse effect of glucocorticoids and synthetic analogues, initial encounter: Secondary | ICD-10-CM | POA: Diagnosis not present

## 2020-03-30 DIAGNOSIS — D509 Iron deficiency anemia, unspecified: Secondary | ICD-10-CM | POA: Diagnosis present

## 2020-03-30 DIAGNOSIS — J1282 Pneumonia due to coronavirus disease 2019: Secondary | ICD-10-CM | POA: Diagnosis present

## 2020-03-30 DIAGNOSIS — G9341 Metabolic encephalopathy: Secondary | ICD-10-CM | POA: Diagnosis not present

## 2020-03-30 DIAGNOSIS — Z8673 Personal history of transient ischemic attack (TIA), and cerebral infarction without residual deficits: Secondary | ICD-10-CM

## 2020-03-30 DIAGNOSIS — Z66 Do not resuscitate: Secondary | ICD-10-CM | POA: Diagnosis present

## 2020-03-30 DIAGNOSIS — E1165 Type 2 diabetes mellitus with hyperglycemia: Secondary | ICD-10-CM | POA: Diagnosis present

## 2020-03-30 DIAGNOSIS — A419 Sepsis, unspecified organism: Secondary | ICD-10-CM | POA: Diagnosis not present

## 2020-03-30 DIAGNOSIS — K279 Peptic ulcer, site unspecified, unspecified as acute or chronic, without hemorrhage or perforation: Secondary | ICD-10-CM | POA: Diagnosis present

## 2020-03-30 DIAGNOSIS — E119 Type 2 diabetes mellitus without complications: Secondary | ICD-10-CM

## 2020-03-30 DIAGNOSIS — E1142 Type 2 diabetes mellitus with diabetic polyneuropathy: Secondary | ICD-10-CM | POA: Diagnosis not present

## 2020-03-30 DIAGNOSIS — I2699 Other pulmonary embolism without acute cor pulmonale: Secondary | ICD-10-CM | POA: Diagnosis not present

## 2020-03-30 DIAGNOSIS — U071 COVID-19: Secondary | ICD-10-CM | POA: Diagnosis present

## 2020-03-30 DIAGNOSIS — I82452 Acute embolism and thrombosis of left peroneal vein: Secondary | ICD-10-CM | POA: Diagnosis present

## 2020-03-30 DIAGNOSIS — A4189 Other specified sepsis: Principal | ICD-10-CM | POA: Diagnosis present

## 2020-03-30 DIAGNOSIS — I63332 Cerebral infarction due to thrombosis of left posterior cerebral artery: Secondary | ICD-10-CM | POA: Diagnosis not present

## 2020-03-30 DIAGNOSIS — I82443 Acute embolism and thrombosis of tibial vein, bilateral: Secondary | ICD-10-CM | POA: Diagnosis present

## 2020-03-30 DIAGNOSIS — E785 Hyperlipidemia, unspecified: Secondary | ICD-10-CM | POA: Diagnosis not present

## 2020-03-30 DIAGNOSIS — I351 Nonrheumatic aortic (valve) insufficiency: Secondary | ICD-10-CM | POA: Diagnosis not present

## 2020-03-30 DIAGNOSIS — Z7902 Long term (current) use of antithrombotics/antiplatelets: Secondary | ICD-10-CM | POA: Diagnosis not present

## 2020-03-30 DIAGNOSIS — D75839 Thrombocytosis, unspecified: Secondary | ICD-10-CM | POA: Diagnosis present

## 2020-03-30 DIAGNOSIS — N179 Acute kidney failure, unspecified: Secondary | ICD-10-CM | POA: Diagnosis present

## 2020-03-30 DIAGNOSIS — R0602 Shortness of breath: Secondary | ICD-10-CM | POA: Diagnosis not present

## 2020-03-30 DIAGNOSIS — E11319 Type 2 diabetes mellitus with unspecified diabetic retinopathy without macular edema: Secondary | ICD-10-CM | POA: Diagnosis present

## 2020-03-30 DIAGNOSIS — Z7984 Long term (current) use of oral hypoglycemic drugs: Secondary | ICD-10-CM

## 2020-03-30 DIAGNOSIS — Z79899 Other long term (current) drug therapy: Secondary | ICD-10-CM

## 2020-03-30 DIAGNOSIS — R0902 Hypoxemia: Secondary | ICD-10-CM | POA: Diagnosis not present

## 2020-03-30 DIAGNOSIS — Z7982 Long term (current) use of aspirin: Secondary | ICD-10-CM

## 2020-03-30 DIAGNOSIS — Z8249 Family history of ischemic heart disease and other diseases of the circulatory system: Secondary | ICD-10-CM

## 2020-03-30 DIAGNOSIS — K219 Gastro-esophageal reflux disease without esophagitis: Secondary | ICD-10-CM | POA: Diagnosis present

## 2020-03-30 DIAGNOSIS — I639 Cerebral infarction, unspecified: Secondary | ICD-10-CM | POA: Diagnosis present

## 2020-03-30 DIAGNOSIS — M1990 Unspecified osteoarthritis, unspecified site: Secondary | ICD-10-CM | POA: Diagnosis present

## 2020-03-30 DIAGNOSIS — J189 Pneumonia, unspecified organism: Secondary | ICD-10-CM | POA: Diagnosis not present

## 2020-03-30 DIAGNOSIS — J9601 Acute respiratory failure with hypoxia: Secondary | ICD-10-CM | POA: Diagnosis present

## 2020-03-30 DIAGNOSIS — I2609 Other pulmonary embolism with acute cor pulmonale: Secondary | ICD-10-CM | POA: Diagnosis not present

## 2020-03-30 DIAGNOSIS — R42 Dizziness and giddiness: Secondary | ICD-10-CM | POA: Diagnosis not present

## 2020-03-30 DIAGNOSIS — I10 Essential (primary) hypertension: Secondary | ICD-10-CM | POA: Diagnosis present

## 2020-03-30 DIAGNOSIS — M6281 Muscle weakness (generalized): Secondary | ICD-10-CM | POA: Diagnosis not present

## 2020-03-30 DIAGNOSIS — J90 Pleural effusion, not elsewhere classified: Secondary | ICD-10-CM | POA: Diagnosis not present

## 2020-03-30 DIAGNOSIS — I69398 Other sequelae of cerebral infarction: Secondary | ICD-10-CM | POA: Diagnosis not present

## 2020-03-30 LAB — CREATININE, SERUM
Creatinine, Ser: 1.41 mg/dL — ABNORMAL HIGH (ref 0.61–1.24)
GFR calc Af Amer: 54 mL/min — ABNORMAL LOW (ref >60)
GFR calc non Af Amer: 47 mL/min — ABNORMAL LOW (ref >60)

## 2020-03-30 LAB — CBC WITH DIFFERENTIAL/PLATELET
Abs Immature Granulocytes: 0.1 10*3/uL — ABNORMAL HIGH (ref 0.00–0.07)
Basophils Absolute: 0 10*3/uL (ref 0.0–0.1)
Basophils Relative: 0 %
Eosinophils Absolute: 0 10*3/uL (ref 0.0–0.5)
Eosinophils Relative: 0 %
HCT: 28.2 % — ABNORMAL LOW (ref 39.0–52.0)
Hemoglobin: 8.5 g/dL — ABNORMAL LOW (ref 13.0–17.0)
Lymphocytes Relative: 3 %
Lymphs Abs: 0.3 10*3/uL — ABNORMAL LOW (ref 0.7–4.0)
MCH: 23.5 pg — ABNORMAL LOW (ref 26.0–34.0)
MCHC: 30.1 g/dL (ref 30.0–36.0)
MCV: 78.1 fL — ABNORMAL LOW (ref 80.0–100.0)
Monocytes Absolute: 0.4 10*3/uL (ref 0.1–1.0)
Monocytes Relative: 4 %
Myelocytes: 1 %
Neutro Abs: 9 10*3/uL — ABNORMAL HIGH (ref 1.7–7.7)
Neutrophils Relative %: 92 %
Platelets: 416 10*3/uL — ABNORMAL HIGH (ref 150–400)
RBC: 3.61 MIL/uL — ABNORMAL LOW (ref 4.22–5.81)
RDW: 16.2 % — ABNORMAL HIGH (ref 11.5–15.5)
WBC: 9.8 10*3/uL (ref 4.0–10.5)
nRBC: 0 /100 WBC
nRBC: 0.2 % (ref 0.0–0.2)

## 2020-03-30 LAB — FERRITIN: Ferritin: 88 ng/mL (ref 24–336)

## 2020-03-30 LAB — COMPREHENSIVE METABOLIC PANEL
ALT: 34 U/L (ref 0–44)
AST: 30 U/L (ref 15–41)
Albumin: 1.9 g/dL — ABNORMAL LOW (ref 3.5–5.0)
Alkaline Phosphatase: 145 U/L — ABNORMAL HIGH (ref 38–126)
Anion gap: 12 (ref 5–15)
BUN: 28 mg/dL — ABNORMAL HIGH (ref 8–23)
CO2: 22 mmol/L (ref 22–32)
Calcium: 7.9 mg/dL — ABNORMAL LOW (ref 8.9–10.3)
Chloride: 93 mmol/L — ABNORMAL LOW (ref 98–111)
Creatinine, Ser: 1.56 mg/dL — ABNORMAL HIGH (ref 0.61–1.24)
GFR calc Af Amer: 48 mL/min — ABNORMAL LOW (ref >60)
GFR calc non Af Amer: 41 mL/min — ABNORMAL LOW (ref >60)
Glucose, Bld: 243 mg/dL — ABNORMAL HIGH (ref 70–99)
Potassium: 4.3 mmol/L (ref 3.5–5.1)
Sodium: 127 mmol/L — ABNORMAL LOW (ref 135–145)
Total Bilirubin: 0.7 mg/dL (ref 0.3–1.2)
Total Protein: 6.1 g/dL — ABNORMAL LOW (ref 6.5–8.1)

## 2020-03-30 LAB — CBC
HCT: 27.9 % — ABNORMAL LOW (ref 39.0–52.0)
Hemoglobin: 8.4 g/dL — ABNORMAL LOW (ref 13.0–17.0)
MCH: 23.5 pg — ABNORMAL LOW (ref 26.0–34.0)
MCHC: 30.1 g/dL (ref 30.0–36.0)
MCV: 77.9 fL — ABNORMAL LOW (ref 80.0–100.0)
Platelets: 382 10*3/uL (ref 150–400)
RBC: 3.58 MIL/uL — ABNORMAL LOW (ref 4.22–5.81)
RDW: 16.2 % — ABNORMAL HIGH (ref 11.5–15.5)
WBC: 9.2 10*3/uL (ref 4.0–10.5)
nRBC: 0 % (ref 0.0–0.2)

## 2020-03-30 LAB — CBG MONITORING, ED
Glucose-Capillary: 273 mg/dL — ABNORMAL HIGH (ref 70–99)
Glucose-Capillary: 203 mg/dL — ABNORMAL HIGH (ref 70–99)

## 2020-03-30 LAB — URINALYSIS, ROUTINE W REFLEX MICROSCOPIC
Bilirubin Urine: NEGATIVE
Glucose, UA: 150 mg/dL — AB
Hgb urine dipstick: NEGATIVE
Ketones, ur: NEGATIVE mg/dL
Leukocytes,Ua: NEGATIVE
Nitrite: NEGATIVE
Protein, ur: 100 mg/dL — AB
Specific Gravity, Urine: 1.018 (ref 1.005–1.030)
pH: 5 (ref 5.0–8.0)

## 2020-03-30 LAB — LACTIC ACID, PLASMA
Lactic Acid, Venous: 3.1 mmol/L (ref 0.5–1.9)
Lactic Acid, Venous: 2.3 mmol/L (ref 0.5–1.9)

## 2020-03-30 LAB — APTT: aPTT: 34 seconds (ref 24–36)

## 2020-03-30 LAB — BRAIN NATRIURETIC PEPTIDE: B Natriuretic Peptide: 211.5 pg/mL — ABNORMAL HIGH (ref 0.0–100.0)

## 2020-03-30 LAB — HEPATITIS B SURFACE ANTIGEN: Hepatitis B Surface Ag: NONREACTIVE

## 2020-03-30 LAB — TROPONIN I (HIGH SENSITIVITY)
Troponin I (High Sensitivity): 47 ng/L — ABNORMAL HIGH (ref <18)
Troponin I (High Sensitivity): 46 ng/L — ABNORMAL HIGH (ref <18)

## 2020-03-30 LAB — SARS CORONAVIRUS 2 BY RT PCR (HOSPITAL ORDER, PERFORMED IN ~~LOC~~ HOSPITAL LAB): SARS Coronavirus 2: POSITIVE — AB

## 2020-03-30 LAB — PROCALCITONIN: Procalcitonin: 4.93 ng/mL

## 2020-03-30 LAB — LACTATE DEHYDROGENASE: LDH: 328 U/L — ABNORMAL HIGH (ref 98–192)

## 2020-03-30 LAB — D-DIMER, QUANTITATIVE: D-Dimer, Quant: >20 ug/mL-FEU — ABNORMAL HIGH (ref 0.00–0.50)

## 2020-03-30 LAB — FIBRINOGEN: Fibrinogen: 723 mg/dL — ABNORMAL HIGH (ref 210–475)

## 2020-03-30 LAB — TRIGLYCERIDES: Triglycerides: 79 mg/dL (ref <150)

## 2020-03-30 LAB — PROTIME-INR
INR: 1.3 — ABNORMAL HIGH (ref 0.8–1.2)
Prothrombin Time: 15.7 seconds — ABNORMAL HIGH (ref 11.4–15.2)

## 2020-03-30 LAB — ABO/RH: ABO/RH(D): A POS

## 2020-03-30 LAB — C-REACTIVE PROTEIN: CRP: 32.1 mg/dL — ABNORMAL HIGH (ref <1.0)

## 2020-03-30 MED ORDER — SIMVASTATIN 20 MG PO TABS
40.0000 mg | ORAL_TABLET | Freq: Every day | ORAL | Status: DC
  Administered 2020-03-31 – 2020-04-01 (×2): 40 mg via ORAL
  Filled 2020-03-30 (×2): qty 2

## 2020-03-30 MED ORDER — PANTOPRAZOLE SODIUM 40 MG PO TBEC
40.0000 mg | DELAYED_RELEASE_TABLET | Freq: Every day | ORAL | Status: DC
  Administered 2020-03-31 – 2020-04-01 (×2): 40 mg via ORAL
  Filled 2020-03-30 (×3): qty 1

## 2020-03-30 MED ORDER — METHYLPREDNISOLONE SODIUM SUCC 40 MG IJ SOLR
0.5000 mg/kg | Freq: Two times a day (BID) | INTRAMUSCULAR | Status: DC
  Administered 2020-03-30 – 2020-03-31 (×2): 38.4 mg via INTRAVENOUS
  Filled 2020-03-30 (×2): qty 1

## 2020-03-30 MED ORDER — ZINC SULFATE 220 (50 ZN) MG PO CAPS
220.0000 mg | ORAL_CAPSULE | Freq: Every day | ORAL | Status: DC
  Administered 2020-03-30 – 2020-04-01 (×3): 220 mg via ORAL
  Filled 2020-03-30 (×3): qty 1

## 2020-03-30 MED ORDER — ALBUTEROL SULFATE (2.5 MG/3ML) 0.083% IN NEBU: 2.5000 mg | INHALATION_SOLUTION | RESPIRATORY_TRACT | Status: DC

## 2020-03-30 MED ORDER — SODIUM CHLORIDE 0.9 % IV SOLN
1000.0000 mL | INTRAVENOUS | Status: DC
  Administered 2020-03-30: 1000 mL via INTRAVENOUS

## 2020-03-30 MED ORDER — IOHEXOL 350 MG/ML SOLN
60.0000 mL | Freq: Once | INTRAVENOUS | Status: AC | PRN
  Administered 2020-03-30: 60 mL via INTRAVENOUS

## 2020-03-30 MED ORDER — ENOXAPARIN SODIUM 80 MG/0.8ML ~~LOC~~ SOLN
75.0000 mg | Freq: Two times a day (BID) | SUBCUTANEOUS | Status: DC
  Administered 2020-03-31 – 2020-04-01 (×3): 75 mg via SUBCUTANEOUS
  Filled 2020-03-30: qty 0.8
  Filled 2020-03-30 (×4): qty 0.75

## 2020-03-30 MED ORDER — ENOXAPARIN SODIUM 40 MG/0.4ML ~~LOC~~ SOLN
35.0000 mg | Freq: Once | SUBCUTANEOUS | Status: AC
  Administered 2020-03-30: 35 mg via SUBCUTANEOUS
  Filled 2020-03-30: qty 0.4

## 2020-03-30 MED ORDER — SODIUM CHLORIDE 0.9 % IV SOLN
2.0000 g | INTRAVENOUS | Status: DC
  Administered 2020-03-30: 2 g via INTRAVENOUS
  Filled 2020-03-30: qty 20

## 2020-03-30 MED ORDER — SODIUM CHLORIDE 0.9 % IV SOLN
100.0000 mg | Freq: Every day | INTRAVENOUS | Status: DC
  Administered 2020-03-31 – 2020-04-01 (×2): 100 mg via INTRAVENOUS
  Filled 2020-03-30 (×2): qty 20

## 2020-03-30 MED ORDER — ACETAMINOPHEN 325 MG PO TABS
650.0000 mg | ORAL_TABLET | Freq: Four times a day (QID) | ORAL | Status: DC | PRN
  Administered 2020-03-30 – 2020-03-31 (×2): 650 mg via ORAL
  Filled 2020-03-30 (×2): qty 2

## 2020-03-30 MED ORDER — SODIUM CHLORIDE 0.9 % IV SOLN
200.0000 mg | Freq: Once | INTRAVENOUS | Status: AC
  Administered 2020-03-30: 200 mg via INTRAVENOUS
  Filled 2020-03-30: qty 40

## 2020-03-30 MED ORDER — ALBUTEROL SULFATE HFA 108 (90 BASE) MCG/ACT IN AERS
2.0000 | INHALATION_SPRAY | Freq: Four times a day (QID) | RESPIRATORY_TRACT | Status: DC
  Administered 2020-03-30 – 2020-04-01 (×9): 2 via RESPIRATORY_TRACT
  Filled 2020-03-30 (×2): qty 6.7

## 2020-03-30 MED ORDER — CLOPIDOGREL BISULFATE 75 MG PO TABS
75.0000 mg | ORAL_TABLET | Freq: Every day | ORAL | Status: DC
  Administered 2020-03-31: 75 mg via ORAL
  Filled 2020-03-30: qty 1

## 2020-03-30 MED ORDER — METOCLOPRAMIDE HCL 10 MG PO TABS
5.0000 mg | ORAL_TABLET | Freq: Three times a day (TID) | ORAL | Status: DC
  Administered 2020-03-31 – 2020-04-01 (×4): 5 mg via ORAL
  Filled 2020-03-30 (×3): qty 1

## 2020-03-30 MED ORDER — TRAMADOL HCL 50 MG PO TABS
50.0000 mg | ORAL_TABLET | ORAL | Status: DC | PRN
  Administered 2020-03-31 – 2020-04-01 (×2): 50 mg via ORAL
  Filled 2020-03-30 (×2): qty 1

## 2020-03-30 MED ORDER — ASCORBIC ACID 500 MG PO TABS
500.0000 mg | ORAL_TABLET | Freq: Every day | ORAL | Status: DC
  Administered 2020-03-30 – 2020-04-01 (×3): 500 mg via ORAL
  Filled 2020-03-30 (×2): qty 1

## 2020-03-30 MED ORDER — ENOXAPARIN SODIUM 300 MG/3ML IJ SOLN: 35.0000 mg | Freq: Once | INTRAMUSCULAR | Status: DC

## 2020-03-30 MED ORDER — SODIUM CHLORIDE 0.9 % IV SOLN
1.0000 g | INTRAVENOUS | Status: DC
  Administered 2020-03-31: 1 g via INTRAVENOUS
  Filled 2020-03-30: qty 10

## 2020-03-30 MED ORDER — BARICITINIB 2 MG PO TABS
2.0000 mg | ORAL_TABLET | Freq: Every day | ORAL | Status: DC
  Filled 2020-03-30: qty 1

## 2020-03-30 MED ORDER — ASPIRIN 81 MG PO CHEW
81.0000 mg | CHEWABLE_TABLET | Freq: Every day | ORAL | Status: DC
  Administered 2020-03-31: 81 mg via ORAL
  Filled 2020-03-30: qty 1

## 2020-03-30 MED ORDER — AMLODIPINE BESYLATE 5 MG PO TABS
5.0000 mg | ORAL_TABLET | Freq: Every day | ORAL | Status: DC
  Administered 2020-03-31 – 2020-04-01 (×2): 5 mg via ORAL
  Filled 2020-03-30 (×2): qty 1

## 2020-03-30 MED ORDER — ENOXAPARIN SODIUM 40 MG/0.4ML ~~LOC~~ SOLN
40.0000 mg | SUBCUTANEOUS | Status: DC
  Administered 2020-03-30: 40 mg via SUBCUTANEOUS
  Filled 2020-03-30: qty 0.4

## 2020-03-30 MED ORDER — ONDANSETRON HCL 4 MG/2ML IJ SOLN
4.0000 mg | Freq: Once | INTRAMUSCULAR | Status: AC
  Administered 2020-03-30: 4 mg via INTRAVENOUS
  Filled 2020-03-30: qty 2

## 2020-03-30 MED ORDER — SODIUM CHLORIDE 0.9 % IV SOLN: 500.0000 mg | INTRAVENOUS | Status: DC

## 2020-03-30 MED ORDER — INSULIN ASPART 100 UNIT/ML ~~LOC~~ SOLN: 0.0000 [IU] | Freq: Three times a day (TID) | SUBCUTANEOUS | Status: DC

## 2020-03-30 MED ORDER — HYDROCOD POLST-CPM POLST ER 10-8 MG/5ML PO SUER
5.0000 mL | Freq: Two times a day (BID) | ORAL | Status: DC | PRN
  Administered 2020-03-31: 5 mL via ORAL
  Filled 2020-03-30: qty 5

## 2020-03-30 MED ORDER — SODIUM CHLORIDE 0.9 % IV SOLN
500.0000 mg | INTRAVENOUS | Status: DC
  Administered 2020-03-30 – 2020-04-01 (×3): 500 mg via INTRAVENOUS
  Filled 2020-03-30 (×3): qty 500

## 2020-03-30 MED ORDER — ONDANSETRON HCL 4 MG PO TABS: 4.0000 mg | ORAL_TABLET | Freq: Four times a day (QID) | ORAL | Status: DC | PRN

## 2020-03-30 MED ORDER — TOCILIZUMAB 400 MG/20ML IV SOLN: 8.0000 mg/kg | Freq: Once | INTRAVENOUS | Status: DC

## 2020-03-30 MED ORDER — GUAIFENESIN-DM 100-10 MG/5ML PO SYRP: 10.0000 mL | ORAL_SOLUTION | ORAL | Status: DC | PRN

## 2020-03-30 MED ORDER — INSULIN ASPART 100 UNIT/ML ~~LOC~~ SOLN
0.0000 [IU] | Freq: Every day | SUBCUTANEOUS | Status: DC
  Administered 2020-03-30: 2 [IU] via SUBCUTANEOUS

## 2020-03-30 MED ORDER — LACTATED RINGERS IV SOLN: INTRAVENOUS | Status: AC

## 2020-03-30 MED ORDER — FERROUS GLUCONATE 324 (38 FE) MG PO TABS
324.0000 mg | ORAL_TABLET | Freq: Every day | ORAL | Status: DC
  Administered 2020-03-31 – 2020-04-01 (×2): 324 mg via ORAL
  Filled 2020-03-30 (×4): qty 1

## 2020-03-30 MED ORDER — ONDANSETRON HCL 4 MG/2ML IJ SOLN
4.0000 mg | Freq: Four times a day (QID) | INTRAMUSCULAR | Status: DC | PRN
  Administered 2020-03-30: 4 mg via INTRAVENOUS
  Filled 2020-03-30: qty 2

## 2020-03-30 NOTE — ED Notes (Signed)
Pt attempted to stand up to use urinal. Pt stated he could not pee lying down. Pt stood up with 1 assist but began to desat. Pt's oxygen saturation was 67% on 12L [place] with good waveform. This RN sat pt back down and increased the oxygen to 15L. Pt's oxygen saturation was still 75% on 15L. This RN called respiratory therapy who suggested putting 15L [place] on top of the 15L [place]. Will continue to monitor pt.

## 2020-03-30 NOTE — ED Triage Notes (Signed)
Pt arrives from home with daughter via POV. Per the daughter, the patient was diagnosed with COVID on 8/12 and was admitted to an [facility] for 3 days. The patient was discharged home, but has developed hypoxia at home (50's-60's oxygen saturation) on room air.   Per daughter, the patient also has suspected colon cancer that he is unable to have further evaluation for at this time due to weakness. She also reports he has a blood clot in his brain. He has also been hyperglycemic at home (blood glucose in the 300's).  Pt on non-rebreather in the lobby with oxygen saturations  88%.   Daughter: Anderson Malta [phone]

## 2020-03-30 NOTE — ED Notes (Signed)
Anderson Malta. Daughter- [phone]- called and would like a pt update

## 2020-03-30 NOTE — ED Provider Notes (Signed)
[place] EMERGENCY DEPARTMENT Provider Note   CSN: [id number] Arrival date & time: 03/18/2020  1257     History No chief complaint on file.   Thomas Adkins is a 79 y.o. male.  HPI   This patient is an 79 year old male, he has multiple medical problems including type 2 diabetes on oral medication with as needed insulin, history of high blood pressure high cholesterol, recent stroke and unfortunately was diagnosed with COVID-19 on 12 August.  He was hospitalized for several days, the daughter is the primary historian secondary to the patient's severe hypoxia and respiratory distress.  She reports that this patient had been doing okay until yesterday and within the last 24 hours she became much much worse with shortness of breath and was found to be severely hypoxic at home today with oxygen is between 50 and 60%.  There is a home health nurse that has been coming out since his discharge.  They were unable to get his oxygen above 60% thus they sent him to the hospital for further evaluation.  The patient does endorse coughing, subjective fevers and some chest discomfort, he denies any swelling of his legs.  The patient is on clopidogrel, Zithromax, as well as his other chronic medications.  It is unclear whether this patient received remdesivir or steroids when he was in the hospital.  He has no pre-existing lung disease including asthma or COPD.  He arrives in respiratory distress  Past Medical History:  Diagnosis Date  . Anxiety   . Arthritis   . Ataxia   . B12 deficiency   . Back ache    back problems  . Cataract   . Depression   . Diabetes ([place]) 06/23/2016   type 2  . Diabetic retinopathy ([place])   . Dizziness   . Fatigue   . GERD (gastroesophageal reflux disease)   . Hearing loss   . High blood pressure 06/23/2016  . High cholesterol 06/23/2016  . History of kidney stones   . Insomnia   . Kidney stone   . Lumbar radiculopathy   . Osteoarthritis   .  Osteomyelitis ([place])    L spine  . Polyneuropathy   . PUD (peptic ulcer disease)   . Tremor     Patient Active Problem List   Diagnosis Date Noted  . Acute hypoxemic respiratory failure due to COVID-19 ([place]) 03/26/2020  . Microcytic anemia 04/08/2020  . Thrombocytosis ([place]) 03/20/2020  . Abnormal MRI, cervical spine 03/18/2020  . CVA (cerebral vascular accident) ([place]) 03/16/2020  . Chronic bilateral low back pain 05/24/2019  . Gastroesophageal reflux disease without esophagitis 05/24/2019  . 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 High blood pressure 06/23/2016  . Diabetes ([place]) 06/23/2016    Past Surgical History:  Procedure Laterality Date  . CATARACT EXTRACTION     bilateral  . EXTRACORPOREAL SHOCK WAVE LITHOTRIPSY Left 11/29/2017   Procedure: LEFT EXTRACORPOREAL SHOCK WAVE LITHOTRIPSY (ESWL);  Surgeon: Franchot Gallo, MD;  Location: [facility] ORS;  Service: Urology;  Laterality: Left;  . EYE SURGERY    . IR LUMBAR DISC ASPIRATION W/IMG GUIDE  09/09/2017  . JOINT REPLACEMENT     toe 40 yrs ago       Family History  Problem Relation Age of Onset  . Cancer Mother   . Heart attack Father     Social History   Tobacco Use  . Smoking status: Former Research scientist (life sciences)  . Smokeless tobacco: Never Used  . Tobacco comment: quit > 40  yrs ago  Vaping Use  . Vaping Use: Never used  Substance Use Topics  . Alcohol use: No  . Drug use: No    Home Medications Prior to Admission medications   Medication Sig Start Date End Date Taking? Authorizing Provider  amLODipine (NORVASC) 5 MG tablet Take 2.5 mg by mouth every evening.  01/23/20   [provider]  aspirin 81 MG tablet Take 1 tablet (81 mg total) by mouth daily for 20 days. 03/18/20 04/07/20  Mariel Aloe, MD  candesartan (ATACAND) 16 MG tablet Take 8 mg by mouth daily.  11/01/19   [provider]  cetirizine (ZYRTEC) 10 MG tablet Take 10 mg by mouth daily as needed for allergies or rhinitis.  11/06/19   [provider]  clopidogrel (PLAVIX) 75 MG tablet Take 1 tablet (75 mg total) by mouth daily. 03/18/20   Mariel Aloe, MD  diclofenac Sodium (VOLTAREN) 1 % GEL Apply 2 g topically 2 (two) times daily as needed (pain).  09/14/19   [provider]  ferrous gluconate (FERGON) 324 MG tablet Take 324 mg by mouth daily with breakfast.    [provider]  meclizine (ANTIVERT) 25 MG tablet Take 25 mg by mouth daily as needed for dizziness.  01/31/20   [provider]  metoCLOPramide (REGLAN) 5 MG tablet Take 5 mg by mouth 2 (two) times daily as needed for nausea or vomiting.  01/22/20   [provider]  Multiple Vitamins-Minerals (PRESERVISION AREDS 2 PO) Take 2 tablets by mouth daily.    [provider]  naproxen sodium (ALEVE) 220 MG tablet Take 440 mg by mouth 2 (two) times daily as needed (pain).     [provider]  omeprazole (PRILOSEC) 20 MG capsule Take 20 mg by mouth daily.    [provider]  scopolamine (TRANSDERM-SCOP) 1 MG/3DAYS Place 1 mg onto the skin every 3 (three) days.  02/06/20   [provider]  simvastatin (ZOCOR) 40 MG tablet Take 1 tablet (40 mg total) by mouth daily. 03/18/20 04/17/20  Mariel Aloe, MD  sucralfate (CARAFATE) 1 g tablet Take 1 g by mouth 2 (two) times daily as needed (GERD, heartburn).  11/05/19   [provider]  traMADol (ULTRAM) 50 MG tablet Take 50 mg by mouth every 6 (six) hours as needed for moderate pain or severe pain.     [provider]    Allergies    Bactrim [sulfamethoxazole-trimethoprim], Hydrocodone, Januvia [sitagliptin], Metformin and related, Norvasc [amlodipine], and Primidone  Review of Systems   Review of Systems  Unable to perform ROS: Acuity of condition    Physical Exam Updated Vital Signs BP 123/77 (BP Location: Left Arm)   Pulse 96   Temp 99.3 F (37.4 C) (Oral)   Resp 17   Ht 1.702 m (5\' 7" )   Wt 77.1 kg   SpO2 90%   BMI 26.63 kg/m    Physical Exam Vitals and nursing note reviewed.  Constitutional:      General: He is in acute distress.     Appearance: He is well-developed. He is ill-appearing.  HENT:     Head: Normocephalic and atraumatic.     Mouth/Throat:     Pharynx: No oropharyngeal exudate.  Eyes:     General: No scleral icterus.       Right eye: No discharge.        Left eye: No discharge.     Conjunctiva/sclera: Conjunctivae normal.  Pupils: Pupils are equal, round, and reactive to light.  Neck:     Thyroid: No thyromegaly.     Vascular: No JVD.  Cardiovascular:     Rate and Rhythm: Regular rhythm. Tachycardia present.     Heart sounds: Normal heart sounds. No murmur heard.  No friction rub. No gallop.   Pulmonary:     Effort: Respiratory distress present.     Breath sounds: Rales present. No wheezing.  Abdominal:     General: Bowel sounds are normal. There is no distension.     Palpations: Abdomen is soft. There is no mass.     Tenderness: There is no abdominal tenderness.  Musculoskeletal:        General: No tenderness. Normal range of motion.     Cervical back: Normal range of motion and neck supple.  Lymphadenopathy:     Cervical: No cervical adenopathy.  Skin:    General: Skin is warm and dry.     Findings: No erythema or rash.  Neurological:     Mental Status: He is alert.     Coordination: Coordination normal.  Psychiatric:        Behavior: Behavior normal.     ED Results / Procedures / Treatments   Labs (all labs ordered are listed, but only abnormal results are displayed) Labs Reviewed  CBC WITH DIFFERENTIAL/PLATELET - Abnormal; Notable for the following components:      Result Value   RBC 3.61 (*)    Hemoglobin 8.5 (*)    HCT 28.2 (*)    MCV 78.1 (*)    MCH 23.5 (*)    RDW 16.2 (*)    Platelets 416 (*)    All other components within normal limits  FIBRINOGEN - Abnormal; Notable for the following components:   Fibrinogen 723 (*)    All other components within  normal limits  PROTIME-INR - Abnormal; Notable for the following components:   Prothrombin Time 15.7 (*)    INR 1.3 (*)    All other components within normal limits  CBG MONITORING, ED - Abnormal; Notable for the following components:   Glucose-Capillary 273 (*)    All other components within normal limits  SARS CORONAVIRUS 2 BY RT PCR (HOSPITAL ORDER, [place] [facility])  CULTURE, BLOOD (ROUTINE X 2)  CULTURE, BLOOD (ROUTINE X 2)  URINE CULTURE  APTT  LACTIC ACID, PLASMA  LACTIC ACID, PLASMA  COMPREHENSIVE METABOLIC PANEL  D-DIMER, QUANTITATIVE (NOT AT [facility])  PROCALCITONIN  LACTATE DEHYDROGENASE  FERRITIN  TRIGLYCERIDES  C-REACTIVE PROTEIN  URINALYSIS, ROUTINE W REFLEX MICROSCOPIC  TROPONIN I (HIGH SENSITIVITY)    EKG None  Radiology DG Chest Port 1 View  Result Date: 03/14/2020 CLINICAL DATA:  Hypoxia in a patient with COVID-19. EXAM: PORTABLE CHEST 1 VIEW COMPARISON:  PA and lateral chest 03/16/2020. FINDINGS: The patient has extensive multifocal airspace disease consistent with pneumonia. Heart size normal. No pneumothorax or pleural effusion. No acute or focal bony abnormality. IMPRESSION: Multifocal pneumonia. Electronically Signed   By: Inge Rise M.D.   On: 03/17/2020 14:34    Procedures .Critical Care Performed by: Noemi Chapel, MD Authorized by: Noemi Chapel, MD   Critical care provider statement:    Critical care time (minutes):  35   Critical care time was exclusive of:  Separately billable procedures and treating other patients and teaching time   Critical care was necessary to treat or prevent imminent or life-threatening deterioration of the following conditions:  Respiratory failure and  sepsis   Critical care was time spent personally by me on the following activities:  Blood draw for specimens, development of treatment plan with patient or surrogate, discussions with consultants, evaluation of patient's response to treatment,  examination of patient, obtaining history from patient or surrogate, ordering and performing treatments and interventions, ordering and review of laboratory studies, ordering and review of radiographic studies, pulse oximetry, re-evaluation of patient's condition and review of old charts   (including critical care time)  Medications Ordered in ED Medications  0.9 %  sodium chloride infusion (1,000 mLs Intravenous New Bag/Given 03/10/2020 1504)  lactated ringers infusion (has no administration in time range)  cefTRIAXone (ROCEPHIN) 2 g in sodium chloride 0.9 % 100 mL IVPB (2 g Intravenous New Bag/Given 03/27/2020 1517)  azithromycin (ZITHROMAX) 500 mg in sodium chloride 0.9 % 250 mL IVPB (has no administration in time range)  ondansetron (ZOFRAN) injection 4 mg (4 mg Intravenous Given 03/25/2020 1504)    ED Course  I have reviewed the triage vital signs and the nursing notes.  Pertinent labs & imaging results that were available during my care of the patient were reviewed by me and considered in my medical decision making (see chart for details).    MDM Rules/Calculators/A&P                          This patient is ill-appearing, he is in respiratory distress tachypneic hypoxic, on a high flow nonrebreather the patient is currently at 85%, he has rales diffusely in both lungs.  He is unable to speak in anything but short and sentences.  He is agreeable to being a full code as needed however at this time with his oxygen at 86 to 87% we will hold off on intubation and discussed with the critical care team.  Code sepsis will be activated as this could be bacterial pneumonia, we will add antibiotic coverage, will wait for a lactic acid as the patient is not hypotensive before initiating fluids  This patient is critically ill  D/w ICU team who will see  DR. Doristine Bosworth to admit  Pt is better on high flow [place],  Thomas Adkins was evaluated in Emergency Department on 03/28/2020 for the symptoms described in  the history of present illness. He was evaluated in the context of the global COVID-19 pandemic, which necessitated consideration that the patient might be at risk for infection with the SARS-CoV-2 virus that causes COVID-19. Institutional protocols and algorithms that pertain to the evaluation of patients at risk for COVID-19 are in a state of rapid change based on information released by regulatory bodies including the CDC and federal and state organizations. These policies and algorithms were followed during the patient's care in the ED.   Final Clinical Impression(s) / ED Diagnoses Final diagnoses:  Pneumonia due to COVID-19 virus  Acute respiratory failure with hypoxia [facility])    Rx / DC Orders ED Discharge Orders    None       Noemi Chapel, MD 03/28/2020 [phone]

## 2020-03-30 NOTE — H&P (Signed)
History and Physical    Thomas Adkins FBP:[id number] DOB: January 28, 1941 DOA: 03/29/2020  PCP: Glenda Chroman, MD  Patient coming from: Home  I have personally briefly reviewed patient's old medical records in [place]  Chief Complaint: Shortness of breath  HPI: Thomas Adkins is a 79 y.o. male with medical history significant of hypertension, hyperlipidemia, type 2 diabetes mellitus, CVA, GERD, arthritis presents to emergency department with hypoxia.  Patient tested positive for COVID-19 on August 12 and was hospitalized for several days at [facility] and discharged home in stable condition however yesterday he became short of breath and this morning his oxygen saturation dropped in 50s and 60s on room air.  He was also evaluated by home health nurse and his oxygen saturation was noted to be 60% on room air therefore he sent to the emergency department for further evaluation and management of hypoxia.  Patient reports cough, congestion, shortness of breath, weakness, lethargy and decreased appetite.  He denies nausea, vomiting, diarrhea, chest pain, wheezing, leg swelling, palpitation, high-grade fever, chills, abdominal pain.  No history of smoking, alcohol, illicit drug use.  ED Course: Upon arrival to ED: Patient hypoxic requiring 10 L of oxygen-high flow nasal cannula, afebrile with no leukocytosis, CBC shows microcytic anemia: 8.5/28.2, MCV: 78.1, platelet: 416, lactic acid: 3.1, CMP shows: Sodium: 127, AKI, D-dimer: More than 20, CRP: 32.  Chest x-ray shows multifocal pneumonia.  Patient received Rocephin and azithromycin in ED.  EDP consulted PCCM who thinks that patient is stable to go to stepdown unit.  [facility] hospitalist consulted for admission for acute hypoxemic respiratory failure secondary to COVID-19 pneumonia.  Review of Systems: As per HPI otherwise negative.    Past Medical History:  Diagnosis Date  . Anxiety   . Arthritis   . Ataxia   . B12 deficiency   . Back ache      back problems  . Cataract   . Depression   . Diabetes ([place]) 06/23/2016   type 2  . Diabetic retinopathy ([place])   . Dizziness   . Fatigue   . GERD (gastroesophageal reflux disease)   . Hearing loss   . High blood pressure 06/23/2016  . High cholesterol 06/23/2016  . History of kidney stones   . Insomnia   . Kidney stone   . Lumbar radiculopathy   . Osteoarthritis   . Osteomyelitis ([place])    L spine  . Polyneuropathy   . PUD (peptic ulcer disease)   . Tremor     Past Surgical History:  Procedure Laterality Date  . CATARACT EXTRACTION     bilateral  . EXTRACORPOREAL SHOCK WAVE LITHOTRIPSY Left 11/29/2017   Procedure: LEFT EXTRACORPOREAL SHOCK WAVE LITHOTRIPSY (ESWL);  Surgeon: Franchot Gallo, MD;  Location: [facility] ORS;  Service: Urology;  Laterality: Left;  . EYE SURGERY    . IR LUMBAR DISC ASPIRATION W/IMG GUIDE  09/09/2017  . JOINT REPLACEMENT     toe 40 yrs ago     reports that he has quit smoking. He has never used smokeless tobacco. He reports that he does not drink alcohol and does not use drugs.  Allergies  Allergen Reactions  . Bactrim [Sulfamethoxazole-Trimethoprim] Other (See Comments)    Fatigue, dry mouth  . Hydrocodone Nausea And Vomiting  . Januvia [Sitagliptin] Other (See Comments)    fatigue  . Metformin And Related Nausea And Vomiting and Other (See Comments)    headache  . Norvasc [Amlodipine] Other (See Comments)    edema  .  Primidone Nausea Only    Family History  Problem Relation Age of Onset  . Cancer Mother   . Heart attack Father     Prior to Admission medications   Medication Sig Start Date End Date Taking? Authorizing Provider  amLODipine (NORVASC) 5 MG tablet Take 2.5 mg by mouth every evening.  01/23/20   [provider]  aspirin 81 MG tablet Take 1 tablet (81 mg total) by mouth daily for 20 days. 03/18/20 04/07/20  Mariel Aloe, MD  candesartan (ATACAND) 16 MG tablet Take 8 mg by mouth daily.  11/01/19   [provider]  cetirizine (ZYRTEC) 10 MG tablet Take 10 mg by mouth daily as needed for allergies or rhinitis.  11/06/19   [provider]  clopidogrel (PLAVIX) 75 MG tablet Take 1 tablet (75 mg total) by mouth daily. 03/18/20   Mariel Aloe, MD  diclofenac Sodium (VOLTAREN) 1 % GEL Apply 2 g topically 2 (two) times daily as needed (pain).  09/14/19   [provider]  ferrous gluconate (FERGON) 324 MG tablet Take 324 mg by mouth daily with breakfast.    [provider]  meclizine (ANTIVERT) 25 MG tablet Take 25 mg by mouth daily as needed for dizziness.  01/31/20   [provider]  metoCLOPramide (REGLAN) 5 MG tablet Take 5 mg by mouth 2 (two) times daily as needed for nausea or vomiting.  01/22/20   [provider]  Multiple Vitamins-Minerals (PRESERVISION AREDS 2 PO) Take 2 tablets by mouth daily.    [provider]  naproxen sodium (ALEVE) 220 MG tablet Take 440 mg by mouth 2 (two) times daily as needed (pain).     [provider]  omeprazole (PRILOSEC) 20 MG capsule Take 20 mg by mouth daily.    [provider]  scopolamine (TRANSDERM-SCOP) 1 MG/3DAYS Place 1 mg onto the skin every 3 (three) days.  02/06/20   [provider]  simvastatin (ZOCOR) 40 MG tablet Take 1 tablet (40 mg total) by mouth daily. 03/18/20 04/17/20  Mariel Aloe, MD  sucralfate (CARAFATE) 1 g tablet Take 1 g by mouth 2 (two) times daily as needed (GERD, heartburn).  11/05/19   [provider]  traMADol (ULTRAM) 50 MG tablet Take 50 mg by mouth every 6 (six) hours as needed for moderate pain or severe pain.     [provider]    Physical Exam: Vitals:   03/12/2020 1358 03/23/2020 1406 03/28/2020 1454 03/18/2020 1456  BP:  138/78  123/77  Pulse:  (!) 105 96 96  Resp:  16 18 17   Temp:   99.3 F (37.4 C) 99.3 F (37.4 C)  TempSrc:   Oral Oral  SpO2: (!) 88% (!) 88% 91% 90%  Weight:      Height:        Constitutional: NAD, calm,  comfortable, on high flow nasal cannula, communicating well Eyes: PERRL, lids and conjunctivae normal ENMT: Mucous membranes are moist. Posterior pharynx clear of any exudate or lesions.Normal dentition.  Neck: normal, supple, no masses, no thyromegaly Respiratory:  Rales noted, no wheezing Cardiovascular: Regular rate and rhythm, no murmurs / rubs / gallops. No extremity edema. 2+ pedal pulses. No carotid bruits.  Abdomen: no tenderness, no masses palpated. No hepatosplenomegaly. Bowel sounds positive.  Musculoskeletal: no clubbing / cyanosis. No joint deformity upper and lower extremities. Good ROM, no contractures. Normal muscle tone.  Skin: no rashes, lesions, ulcers. No induration Neurologic: CN 2-12 grossly  intact. Sensation intact, DTR normal. Strength 5/5 in all 4.  Psychiatric: Normal judgment and insight. Alert and oriented x 3. Normal mood.    Labs on Admission: I have personally reviewed following labs and imaging studies  CBC: Recent Labs  Lab 03/12/2020 1435  WBC 9.8  NEUTROABS PENDING  HGB 8.5*  HCT 28.2*  MCV 78.1*  PLT 109*   Basic Metabolic Panel: No results for input(s): NA, K, CL, CO2, GLUCOSE, BUN, CREATININE, CALCIUM, MG, PHOS in the last 168 hours. GFR: Estimated Creatinine Clearance: 44.4 mL/min (by C-G formula based on SCr of 1.24 mg/dL). Liver Function Tests: No results for input(s): AST, ALT, ALKPHOS, BILITOT, PROT, ALBUMIN in the last 168 hours. No results for input(s): LIPASE, AMYLASE in the last 168 hours. No results for input(s): AMMONIA in the last 168 hours. Coagulation Profile: Recent Labs  Lab 03/17/2020 1435  INR 1.3*   Cardiac Enzymes: No results for input(s): CKTOTAL, CKMB, CKMBINDEX, TROPONINI in the last 168 hours. BNP (last 3 results) No results for input(s): PROBNP in the last 8760 hours. HbA1C: No results for input(s): HGBA1C in the last 72 hours. CBG: Recent Labs  Lab 03/12/2020 1338  GLUCAP 273*   Lipid Profile: No results  for input(s): CHOL, HDL, LDLCALC, TRIG, CHOLHDL, LDLDIRECT in the last 72 hours. Thyroid Function Tests: No results for input(s): TSH, T4TOTAL, FREET4, T3FREE, THYROIDAB in the last 72 hours. Anemia Panel: No results for input(s): VITAMINB12, FOLATE, FERRITIN, TIBC, IRON, RETICCTPCT in the last 72 hours. Urine analysis:    Component Value Date/Time   COLORURINE YELLOW 03/16/2020 1836   APPEARANCEUR CLEAR 03/16/2020 1836   LABSPEC 1.019 03/16/2020 1836   PHURINE 5.0 03/16/2020 1836   GLUCOSEU 150 (A) 03/16/2020 1836   HGBUR NEGATIVE 03/16/2020 1836   BILIRUBINUR NEGATIVE 03/16/2020 1836   KETONESUR NEGATIVE 03/16/2020 1836   PROTEINUR NEGATIVE 03/16/2020 1836   NITRITE NEGATIVE 03/16/2020 1836   LEUKOCYTESUR NEGATIVE 03/16/2020 1836    Radiological Exams on Admission: DG Chest Port 1 View  Result Date: 04/04/2020 CLINICAL DATA:  Hypoxia in a patient with COVID-19. EXAM: PORTABLE CHEST 1 VIEW COMPARISON:  PA and lateral chest 03/16/2020. FINDINGS: The patient has extensive multifocal airspace disease consistent with pneumonia. Heart size normal. No pneumothorax or pleural effusion. No acute or focal bony abnormality. IMPRESSION: Multifocal pneumonia. Electronically Signed   By: Inge Rise M.D.   On: 03/13/2020 14:34     Assessment/Plan Principal Problem:   Acute hypoxemic respiratory failure due to COVID-19 [facility]) Active Problems:   Diabetes (HCC)   CVA (cerebral vascular accident) ([place])   Microcytic anemia   Thrombocytosis ([place])   Acute hypoxemic respiratory failure due to COVID-19 infection: -currently requiring high flow nasal cannula, he is afebrile with no leukocytosis, lactic acid: 3.1, COVID-19 positive.  Reviewed chest x-ray. -Patient received Rocephin and azithromycin in ED. -CRP: 32.1, procalcitonin level: 4.93, D-dimer: More than 20. -Blood culture is pending. -Start patient on remdesivir and Solu-Medrol. -Repeat inflammatory markers tomorrow AM.  Albuterol  every 6 hours, p.o. vitamins and antitussive. -Considering risks vs benefits-will hold off Actemra/Baricitinib at this time due to ongoing superimposed bacterial infection. -Monitor vitals closely.  Elevated D-dimer: ->20.  Will get stat CT angio of chest and get Doppler ultrasound of bilateral lower extremities. -Start Lovenox per pharmacy for PE  Microcytic anemia: H&H 8.5/28.2, MCV: 78.1 -Stable -Monitor H&H closely. -Continue ferrous gluconate  Thrombocytosis: Platelet: 416. -Likely secondary to COVID-19 infection.  Repeat CBC tomorrow a.m.  AKI: Likely secondary  to dehydration -Avoid nephrotoxic medication.  Repeat BMP tomorrow a.m.  Type 2 diabetes mellitus: A1c 8.7%. -Started patient on sliding scale insulin.  Monitor blood sugar closely  Hypertension: On amlodipine and candesartan at home-resume home meds  History of stroke-left thalamic infarct: -Continue aspirin, statin, Plavix  GERD/PUD: -Continue Carafate, PPI  Hypertension: Continue amlodipine  Hx of colon mass? -follow up outpatient.  Patient is on increased risk of morbidity and mortality.  DVT prophylaxis: Lovenox/SCD  code Status: Full code Family Communication: None present at bedside.  Plan of care discussed with patient in length and he verbalized understanding and agreed with it.  I called patient's wife and discussed plan of care with her and she verbalized understanding.  Disposition Plan: Home in 3 to 4 days Consults called: PCCM by EDP Admission status: Inpatient   Mckinley Jewel MD [facility]  If 7PM-7AM, please contact night-coverage [email or web address] Password [email or web address]  03/24/2020, 3:20 PM

## 2020-03-30 NOTE — Progress Notes (Signed)
ANTICOAGULATION CONSULT NOTE - Initial Consult  Pharmacy Consult for lovenox Indication: r/o VTE  Allergies  Allergen Reactions  . Bactrim [Sulfamethoxazole-Trimethoprim] Other (See Comments)    Fatigue, dry mouth  . Hydrocodone Nausea And Vomiting  . Januvia [Sitagliptin] Other (See Comments)    fatigue  . Metformin And Related Nausea And Vomiting and Other (See Comments)    headache  . Norvasc [Amlodipine] Other (See Comments)    edema  . Primidone Nausea Only    Patient Measurements: Height: 5\' 7"  (170.2 cm) Weight: 77.1 kg (170 lb) IBW/kg (Calculated) : 66.1  Vital Signs: Temp: 99.3 F (37.4 C) (08/21 1456) Temp Source: Oral (08/21 1456) BP: 123/77 (08/21 1456) Pulse Rate: 96 (08/21 1456)  Labs: Recent Labs    03/11/2020 1435  HGB 8.5*  HCT 28.2*  PLT 416*  APTT 34  LABPROT 15.7*  INR 1.3*  CREATININE 1.56*  TROPONINIHS 47*    Estimated Creatinine Clearance: 35.3 mL/min (A) (by C-G formula based on SCr of 1.56 mg/dL (H)).   Medical History: Past Medical History:  Diagnosis Date  . Anxiety   . Arthritis   . Ataxia   . B12 deficiency   . Back ache    back problems  . Cataract   . Depression   . Diabetes ([place]) 06/23/2016   type 2  . Diabetic retinopathy ([place])   . Dizziness   . Fatigue   . GERD (gastroesophageal reflux disease)   . Hearing loss   . High blood pressure 06/23/2016  . High cholesterol 06/23/2016  . History of kidney stones   . Insomnia   . Kidney stone   . Lumbar radiculopathy   . Osteoarthritis   . Osteomyelitis ([place])    L spine  . Polyneuropathy   . PUD (peptic ulcer disease)   . Tremor    Assessment: 55 yom presented to the ED with worsening SOB. He was recently hospitalized for COVID. To start empiric lovenox for possible VTE. D-dimer is >20. He is no on anticoagulation PTA.   Goal of Therapy:  Anti-Xa level 0.6-1 units/ml 4hrs after LMWH dose given Monitor platelets by anticoagulation protocol: Yes   Plan:  Give  lovenox 35mg  now since pt just received a dose of prophylactic lovenox 40mg  Start lovenox 75mg  SQ Q12H tomorrow morning F/u CBC F/u imaging   Erlean Mealor, Rande Lawman 03/23/2020,4:55 PM

## 2020-03-30 NOTE — Progress Notes (Addendum)
Seen for ICU admission for COVID-related hypoxemia. On 10L, speaking in full sentences, strong cough, no distress. States he is full code  Fine for 5W or similar level of care with usual COVID treatment and lung recruitment measures.  Please call if O2 needs are rapidly escalating.  No charge Erskine Emery MD PCCM

## 2020-03-30 NOTE — TOC Initial Note (Signed)
Transition of Care [facility]) - Initial/Assessment Note    Patient Details  Name: Thomas Adkins MRN: [id number] Date of Birth: [birth year]  Transition of Care [facility]) CM/SW Contact:    Verdell Carmine, RN Phone Number: 03/25/2020, 5:54 PM  Clinical Narrative:                  This is a readmission for COVID pneumonia. Is active with [facility] home health RN was coming out to check on patient as well as PT. Oxygen sats were low. Brought to ED for evaluation. Patient has [employer].  Will need resumption of [place] services and home oxygen is likely. CM will follow for needs  Expected Discharge Plan: [place] Barriers to Discharge: Continued Medical Work up   Patient Goals and CMS Choice        Expected Discharge Plan and Services Expected Discharge Plan: [place]   Discharge Planning Services: CM Consult                                          Prior Living Arrangements/Services   Lives with:: Spouse Patient language and need for interpreter reviewed:: Yes        Need for Family Participation in Patient Care: Yes (Comment) Care giver support system in place?: Yes (comment)   Criminal Activity/Legal Involvement Pertinent to Current Situation/Hospitalization: No - Comment as needed  Activities of Daily Living      Permission Sought/Granted                  Emotional Assessment         Alcohol / Substance Use: Not Applicable Psych Involvement: No (comment)  Admission diagnosis:  Acute hypoxemic respiratory failure due to COVID-19 ([place]) [U07.1, J96.01] Patient Active Problem List   Diagnosis Date Noted  . Acute hypoxemic respiratory failure due to COVID-19 ([place]) 03/29/2020  . Microcytic anemia 03/18/2020  . Thrombocytosis ([place]) 03/15/2020  . Abnormal MRI, cervical spine 03/18/2020  . CVA (cerebral vascular accident) ([place]) 03/16/2020  . Chronic bilateral low back pain 05/24/2019  . Gastroesophageal reflux disease  without esophagitis 05/24/2019  . 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 High blood pressure 06/23/2016  . Diabetes ([place]) 06/23/2016   PCP:  Glenda Chroman, MD Pharmacy:   [facility], [place] [address] [place] [address] Phone: [phone] Fax: [facility] [place], [place] [address] Phone: [phone] Fax: [phone]     Social Determinants of Health (SDOH) Interventions    Readmission Risk Interventions No flowsheet data found.

## 2020-03-31 ENCOUNTER — Inpatient Hospital Stay (HOSPITAL_COMMUNITY): Payer: Medicare HMO

## 2020-03-31 DIAGNOSIS — D509 Iron deficiency anemia, unspecified: Secondary | ICD-10-CM

## 2020-03-31 DIAGNOSIS — J1282 Pneumonia due to coronavirus disease 2019: Secondary | ICD-10-CM

## 2020-03-31 DIAGNOSIS — I2699 Other pulmonary embolism without acute cor pulmonale: Secondary | ICD-10-CM

## 2020-03-31 DIAGNOSIS — U071 COVID-19: Secondary | ICD-10-CM

## 2020-03-31 DIAGNOSIS — I63332 Cerebral infarction due to thrombosis of left posterior cerebral artery: Secondary | ICD-10-CM

## 2020-03-31 LAB — CBG MONITORING, ED
Glucose-Capillary: 226 mg/dL — ABNORMAL HIGH (ref 70–99)
Glucose-Capillary: 280 mg/dL — ABNORMAL HIGH (ref 70–99)
Glucose-Capillary: 176 mg/dL — ABNORMAL HIGH (ref 70–99)

## 2020-03-31 LAB — COMPREHENSIVE METABOLIC PANEL
ALT: 27 U/L (ref 0–44)
AST: 23 U/L (ref 15–41)
Albumin: 1.5 g/dL — ABNORMAL LOW (ref 3.5–5.0)
Alkaline Phosphatase: 131 U/L — ABNORMAL HIGH (ref 38–126)
Anion gap: 11 (ref 5–15)
BUN: 23 mg/dL (ref 8–23)
CO2: 20 mmol/L — ABNORMAL LOW (ref 22–32)
Calcium: 7.4 mg/dL — ABNORMAL LOW (ref 8.9–10.3)
Chloride: 99 mmol/L (ref 98–111)
Creatinine, Ser: 1.09 mg/dL (ref 0.61–1.24)
GFR calc Af Amer: >60 mL/min (ref >60)
GFR calc non Af Amer: >60 mL/min (ref >60)
Glucose, Bld: 213 mg/dL — ABNORMAL HIGH (ref 70–99)
Potassium: 4.4 mmol/L (ref 3.5–5.1)
Sodium: 130 mmol/L — ABNORMAL LOW (ref 135–145)
Total Bilirubin: 0.7 mg/dL (ref 0.3–1.2)
Total Protein: 5.4 g/dL — ABNORMAL LOW (ref 6.5–8.1)

## 2020-03-31 LAB — URINE CULTURE: Culture: NO GROWTH

## 2020-03-31 LAB — CBC WITH DIFFERENTIAL/PLATELET
Abs Immature Granulocytes: 0.26 10*3/uL — ABNORMAL HIGH (ref 0.00–0.07)
Basophils Absolute: 0 10*3/uL (ref 0.0–0.1)
Basophils Relative: 0 %
Eosinophils Absolute: 0 10*3/uL (ref 0.0–0.5)
Eosinophils Relative: 0 %
HCT: 27.5 % — ABNORMAL LOW (ref 39.0–52.0)
Hemoglobin: 8.4 g/dL — ABNORMAL LOW (ref 13.0–17.0)
Immature Granulocytes: 4 %
Lymphocytes Relative: 5 %
Lymphs Abs: 0.4 10*3/uL — ABNORMAL LOW (ref 0.7–4.0)
MCH: 23.9 pg — ABNORMAL LOW (ref 26.0–34.0)
MCHC: 30.5 g/dL (ref 30.0–36.0)
MCV: 78.1 fL — ABNORMAL LOW (ref 80.0–100.0)
Monocytes Absolute: 0.3 10*3/uL (ref 0.1–1.0)
Monocytes Relative: 5 %
Neutro Abs: 5.5 10*3/uL (ref 1.7–7.7)
Neutrophils Relative %: 86 %
Platelets: 367 10*3/uL (ref 150–400)
RBC: 3.52 MIL/uL — ABNORMAL LOW (ref 4.22–5.81)
RDW: 16.5 % — ABNORMAL HIGH (ref 11.5–15.5)
WBC: 6.5 10*3/uL (ref 4.0–10.5)
nRBC: 0 % (ref 0.0–0.2)

## 2020-03-31 LAB — MAGNESIUM: Magnesium: 1.8 mg/dL (ref 1.7–2.4)

## 2020-03-31 LAB — FERRITIN: Ferritin: 73 ng/mL (ref 24–336)

## 2020-03-31 LAB — C-REACTIVE PROTEIN: CRP: 28.1 mg/dL — ABNORMAL HIGH (ref <1.0)

## 2020-03-31 LAB — PHOSPHORUS: Phosphorus: 3.3 mg/dL (ref 2.5–4.6)

## 2020-03-31 LAB — D-DIMER, QUANTITATIVE: D-Dimer, Quant: 1.14 ug/mL-FEU — ABNORMAL HIGH (ref 0.00–0.50)

## 2020-03-31 MED ORDER — INSULIN DETEMIR 100 UNIT/ML ~~LOC~~ SOLN
10.0000 [IU] | Freq: Two times a day (BID) | SUBCUTANEOUS | Status: DC
  Administered 2020-03-31 – 2020-04-01 (×2): 10 [IU] via SUBCUTANEOUS
  Filled 2020-03-31 (×4): qty 0.1

## 2020-03-31 MED ORDER — INSULIN ASPART 100 UNIT/ML ~~LOC~~ SOLN
4.0000 [IU] | Freq: Three times a day (TID) | SUBCUTANEOUS | Status: DC
  Administered 2020-03-31 – 2020-04-01 (×5): 4 [IU] via SUBCUTANEOUS

## 2020-03-31 MED ORDER — SODIUM CHLORIDE 0.9 % IV SOLN: 1000.0000 mL | INTRAVENOUS | Status: DC

## 2020-03-31 MED ORDER — NAPHAZOLINE-PHENIRAMINE 0.025-0.3 % OP SOLN
1.0000 [drp] | Freq: Four times a day (QID) | OPHTHALMIC | Status: DC | PRN
  Administered 2020-03-31: 1 [drp] via OPHTHALMIC
  Filled 2020-03-31: qty 15

## 2020-03-31 MED ORDER — INSULIN ASPART 100 UNIT/ML ~~LOC~~ SOLN
0.0000 [IU] | Freq: Three times a day (TID) | SUBCUTANEOUS | Status: DC
  Administered 2020-03-31: 11 [IU] via SUBCUTANEOUS
  Administered 2020-03-31: 4 [IU] via SUBCUTANEOUS
  Administered 2020-03-31: 7 [IU] via SUBCUTANEOUS
  Administered 2020-04-01: 4 [IU] via SUBCUTANEOUS
  Administered 2020-04-01: 7 [IU] via SUBCUTANEOUS

## 2020-03-31 MED ORDER — METHYLPREDNISOLONE SODIUM SUCC 125 MG IJ SOLR
70.0000 mg | Freq: Two times a day (BID) | INTRAMUSCULAR | Status: DC
  Administered 2020-03-31 – 2020-04-01 (×2): 70 mg via INTRAVENOUS
  Filled 2020-03-31 (×2): qty 2

## 2020-03-31 MED ORDER — MAGNESIUM SULFATE IN D5W 1-5 GM/100ML-% IV SOLN
1.0000 g | Freq: Once | INTRAVENOUS | Status: AC
  Administered 2020-03-31: 1 g via INTRAVENOUS
  Filled 2020-03-31 (×2): qty 100

## 2020-03-31 NOTE — Progress Notes (Signed)
VASCULAR LAB    Bilateral lower extremity venous duplex completed.    Preliminary report:  See CV proc for preliminary results.  Messaged Dr. Sloan Leiter with results.   Princella Jaskiewicz, RVT 03/31/2020, 1:15 PM

## 2020-03-31 NOTE — ED Notes (Signed)
Pt's CBG result was 226. Informed Becky - RN.

## 2020-03-31 NOTE — Progress Notes (Signed)
PROGRESS NOTE                                                                                                                                                                                                             Patient Demographics:    Thomas Adkins, is a 79 y.o. male, DOB - 1941-07-23, PPI:[id number]  Outpatient Primary MD for the patient is Glenda Chroman, MD   Admit date - 03/14/2020   LOS - 1  No chief complaint on file.      Brief Narrative: Patient is a 79 y.o. male with PMHx of HTN, HLD, DM-2, GERD, recent hospitalization for CVA-diagnosed with COVID-19 on 8/12-subsequently hospitalized in [place] for a few days-discharged home in a stable manner-presented to [facility] on 8/21 with almost 10-day history of cough, weakness-then gradually worsening shortness of breath-found to have acute hypoxic respiratory failure requiring HFNC-due to COVID-19 pneumonia-as well as superimposed bacterial pneumonia and pulmonary embolism.  See below for further details.  COVID-19 vaccinated status: Vaccinated  Significant Events: 8/7-8/8>> hospitalized for CVA.  Covid negative on 8/7. 8/12-8/16>> Covid positive-hospitalized in [place] 8/21>> Admit to [facility] for shortness of breath-hypoxia from COVID-19 pneumonia, bacterial pneumonia and PE/DVT.  Significant studies: 8/22>> lower extremity Doppler: DVT in the right posterior tibial vein, left posterior tibial vein, left peroneal vein 8/21>>Chest x-ray: Multifocal pneumonia 8/21>> CTA chest: Marked severity multifocal infiltrates throughout both lungs, PE within a posterior subsegmental lower lobe branch of the right pulmonary artery 8/13>> CT abdomen/pelvis (at [facility]): Circumferential 6 cm soft tissue mass of the distal transverse colon-compatible with colon carcinoma.  Superimposed mild regional mesenteric stranding and lymphadenopathy. 8/8>> Echo: EF 06-30%, grade 2 diastolic  dysfunction 1/6>> CTA head/neck: No significant stenosis in carotid artery in neck bilaterally.  COVID-19 medications: Steroids: 8/21>> Remdesivir: 8/21>>  Antibiotics: Rocephin: 8/21>> Zithromax: 8/21>>  Microbiology data: 8/21 >>blood culture: No growth  Procedures: None  Consults: None  DVT prophylaxis: Therapeutic Lovenox    Subjective:    Welcome Claire today remains comfortable-he is not short of breath at rest-on 15 L of HFNC.  Per nursing staff-desaturates easily with minimal movement even on 50 L of HFNC-at times requires addition of NRB.   Assessment  & Plan :   Acute Hypoxic Resp Failure due to Covid 19 Viral pneumonia, concern for superimposed bacterial pneumonia and small  PE: Has severe hypoxemia-requiring 15 L of HFNC-but appears comfortable-continue Solu-Medrol (but increased dosage for a few days)-Remdesivir and empiric Rocephin/Zithromax.  Due to significantly elevated procalcitonin level-concern for bacterial infection-holding off on baricitinib.  Repeat procalcitonin tomorrow-monitor closely.  Fever: afebrile O2 requirements:  SpO2: 91 % O2 Flow Rate (L/min): 15 L/min   COVID-19 Labs: Recent Labs    03/20/2020 1435 03/31/20 0604  DDIMER >20.00* 1.14*  FERRITIN 88 73  LDH 328*  --   CRP 32.1* 28.1*       Component Value Date/Time   BNP 211.5 (H) 04/01/2020 1731    Recent Labs  Lab 03/16/2020 1435  PROCALCITON 4.93    Lab Results  Component Value Date   SARSCOV2NAA POSITIVE (A) 03/15/2020   Derma NEGATIVE 03/16/2020    Prone/Incentive Spirometry: encouraged patient to lie prone for 3-4 hours at a time for a total of 16 hours a day, and to encourage incentive spirometry use 3-4/hour.  PE with bilateral lower extremity DVT: On therapeutic Lovenox.  Likely provoked by COVID-19-and possibility of hypercoagulable state of malignancy (recent CT with possible colon mass-see below)  Recent CVA (left thalamic infarct): Thought to be due to  small vessel disease-was discharged on aspirin/Plavix-but now on full dose anticoagulation for VTE.  Spoke with stroke MD on-call Dr. Kizzie Fantasia 8/22-chart reviewed together-recommendations are to go ahead and stop aspirin/Plavix-and to continue with full dose anticoagulation for VTE.  He has no deficits on exam-from chart review-appears that this was mostly incidental CVA-only complains of some dizziness.  AKI: Improved-likely hemodynamically mediated.  HTN: Controlled-continue amlodipine and candesartan.  GERD: Continue Carafate/PPI  Microcytic anemia: Likely iron deficient-colon mass seen on recent CT (see above)-watch for bleeding while he is on full dose anticoagulation.  Start iron supplementation.  Vitamin B12 deficiency: Vitamin B12 borderline low- will start supplementation.  DM-2 (A1c 8.7 on 8/7) with uncontrolled hyperglycemia due to steroids: Suspect hyperglycemia will worsen with escalation in steroid dosing-have added Levemir 10 units twice daily, add 4 units of of NovoLog with meals-changed SSI to resistant scale.  Follow and adjust.  Recent Labs    04/06/2020 1338 03/21/2020 2052 03/31/20 0800  GLUCAP 273* 203* 226*   Transverse colon mass seen on CT scan: See above-he has microcytic-iron deficient anemia-high suspicion for underlying colon malignancy.  Unfortunately colonoscopy will have to be deferred until he is more stable.  Palliative care discussion: Long discussion with patient-daughter (FaceTime while I was in the patient's room)-explained that the patient is critically ill-with severe hypoxemia-due to Covid/bacterial pneumonia/VTE-appears to have underlying undiagnosed colon cancer.  He is frail-and of advanced age.  Both patient and family aware that if patient were to deteriorate-he is not a good candidate for aggressive care-have recommended DNR status.  Have asked family to begin goals of care discussion-for now while discussion is ongoing-patient remains relatively  stable-remains a full code.   GI prophylaxis: PPI  ABG: No results found for: PHART, PCO2ART, PO2ART, HCO3, TCO2, ACIDBASEDEF, O2SAT  Vent Settings: N/A  Condition - Extremely Guarded  Family Communication  :  Daughter-Jennifer [phone]  updated over the phone 8/22  Code Status :  Full Code  Diet :  Diet Order            Diet NPO time specified  Diet effective now                  Disposition Plan  :   Status is: Inpatient  Remains inpatient appropriate because:Inpatient level of care appropriate  due to severity of illness   Dispo: The patient is from: Home              Anticipated d/c is to: Home              Anticipated d/c date is: > 3 days              Patient currently is not medically stable to d/c.  Barriers to discharge: Hypoxia requiring O2 supplementation/complete 5 days of IV Remdesivir  Antimicorbials  :    Anti-infectives (From admission, onward)   Start     Dose/Rate Route Frequency Ordered Stop   03/31/20 1800  azithromycin (ZITHROMAX) 500 mg in sodium chloride 0.9 % 250 mL IVPB  Status:  Discontinued        500 mg 250 mL/hr over 60 Minutes Intravenous Every 24 hours 03/28/2020 1632 03/13/2020 1633   03/31/20 1500  cefTRIAXone (ROCEPHIN) 1 g in sodium chloride 0.9 % 100 mL IVPB        1 g 200 mL/hr over 30 Minutes Intravenous Every 24 hours 03/19/2020 1632     03/31/20 1000  remdesivir 100 mg in sodium chloride 0.9 % 100 mL IVPB       "Followed by" Linked Group Details   100 mg 200 mL/hr over 30 Minutes Intravenous Daily 03/14/2020 1519 04/04/20 0959   04/04/2020 1530  remdesivir 200 mg in sodium chloride 0.9% 250 mL IVPB       "Followed by" Linked Group Details   200 mg 580 mL/hr over 30 Minutes Intravenous Once 04/06/2020 1519 03/31/2020 1824   03/19/2020 1345  cefTRIAXone (ROCEPHIN) 2 g in sodium chloride 0.9 % 100 mL IVPB  Status:  Discontinued        2 g 200 mL/hr over 30 Minutes Intravenous Every 24 hours 03/23/2020 1331 03/13/2020 1633   03/15/2020 1345   azithromycin (ZITHROMAX) 500 mg in sodium chloride 0.9 % 250 mL IVPB        500 mg 250 mL/hr over 60 Minutes Intravenous Every 24 hours 03/18/2020 1331        Inpatient Medications  Scheduled Meds: . albuterol  2 puff Inhalation Q6H  . amLODipine  5 mg Oral Daily  . vitamin C  500 mg Oral Daily  . aspirin  81 mg Oral Daily  . clopidogrel  75 mg Oral Daily  . enoxaparin (LOVENOX) injection  75 mg Subcutaneous Q12H  . ferrous gluconate  324 mg Oral Q breakfast  . insulin aspart  0-20 Units Subcutaneous TID WC  . insulin aspart  0-5 Units Subcutaneous QHS  . insulin aspart  4 Units Subcutaneous TID WC  . insulin detemir  10 Units Subcutaneous BID  . methylPREDNISolone (SOLU-MEDROL) injection  70 mg Intravenous Q12H  . metoCLOPramide  5 mg Oral TID WC  . pantoprazole  40 mg Oral Daily  . simvastatin  40 mg Oral Daily  . zinc sulfate  220 mg Oral Daily   Continuous Infusions: . sodium chloride 10 mL/hr at 03/31/20 0741  . azithromycin Stopped (03/29/2020 1824)  . cefTRIAXone (ROCEPHIN)  IV    . remdesivir 100 mg in NS 100 mL Stopped (03/31/20 1040)   PRN Meds:.acetaminophen, chlorpheniramine-HYDROcodone, guaiFENesin-dextromethorphan, ondansetron **OR** ondansetron (ZOFRAN) IV, traMADol   Time Spent in minutes  35    See all Orders from today for further details   Oren Binet M.D on 03/31/2020 at 1:27 PM  To page go to [email or web address] - use universal password  [facility] -  Office  [phone]    Objective:   Vitals:   03/31/20 1230 03/31/20 1245 03/31/20 1300 03/31/20 1315  BP: (!) 142/75  135/77   Pulse:  (!) 101 100 99  Resp: 19 18 18 17   Temp:      TempSrc:      SpO2:  94% 91% 91%  Weight:      Height:        Wt Readings from Last 3 Encounters:  03/29/2020 77.1 kg  03/16/20 75.8 kg  02/19/20 75 kg     Intake/Output Summary (Last 24 hours) at 03/31/2020 1327 Last data filed at 03/31/2020 1140 Gross per 24 hour  Intake 600 ml  Output 600 ml  Net  0 ml     Physical Exam Gen Exam:Alert awake-not in any distress HEENT:atraumatic, normocephalic Chest: B/L clear to auscultation anteriorly CVS:S1S2 regular Abdomen:soft non tender, non distended Extremities:no edema Neurology: Non focal Skin: no rash   Data Review:    CBC Recent Labs  Lab 03/16/2020 1435 04/09/2020 1731 03/31/20 0604  WBC 9.8 9.2 6.5  HGB 8.5* 8.4* 8.4*  HCT 28.2* 27.9* 27.5*  PLT 416* 382 367  MCV 78.1* 77.9* 78.1*  MCH 23.5* 23.5* 23.9*  MCHC 30.1 30.1 30.5  RDW 16.2* 16.2* 16.5*  LYMPHSABS 0.3*  --  0.4*  MONOABS 0.4  --  0.3  EOSABS 0.0  --  0.0  BASOSABS 0.0  --  0.0    Chemistries  Recent Labs  Lab 03/18/2020 1435 03/15/2020 1731 03/31/20 0604  NA 127*  --  130*  K 4.3  --  4.4  CL 93*  --  99  CO2 22  --  20*  GLUCOSE 243*  --  213*  BUN 28*  --  23  CREATININE 1.56* 1.41* 1.09  CALCIUM 7.9*  --  7.4*  MG  --   --  1.8  AST 30  --  23  ALT 34  --  27  ALKPHOS 145*  --  131*  BILITOT 0.7  --  0.7   ------------------------------------------------------------------------------------------------------------------ Recent Labs    03/16/2020 1435  TRIG 79    Lab Results  Component Value Date   HGBA1C 8.7 (H) 03/16/2020   ------------------------------------------------------------------------------------------------------------------ No results for input(s): TSH, T4TOTAL, T3FREE, THYROIDAB in the last 72 hours.  Invalid input(s): FREET3 ------------------------------------------------------------------------------------------------------------------ Recent Labs    03/10/2020 1435 03/31/20 0604  FERRITIN 88 73    Coagulation profile Recent Labs  Lab 04/07/2020 1435  INR 1.3*    Recent Labs    03/19/2020 1435 03/31/20 0604  DDIMER >20.00* 1.14*    Cardiac Enzymes No results for input(s): CKMB, TROPONINI, MYOGLOBIN in the last 168 hours.  Invalid input(s):  CK ------------------------------------------------------------------------------------------------------------------    Component Value Date/Time   BNP 211.5 (H) 03/17/2020 1731    Micro Results Recent Results (from the past 240 hour(s))  SARS Coronavirus 2 by RT PCR (hospital order, performed in [facility] hospital lab) Nasopharyngeal Nasopharyngeal Swab     Status: Abnormal   Collection Time: 03/22/2020  2:00 PM   Specimen: Nasopharyngeal Swab  Result Value Ref Range Status   SARS Coronavirus 2 POSITIVE (A) NEGATIVE Final    Comment: RESULT CALLED TO, READ BACK BY AND VERIFIED WITH: RN joe b. [phone] fcp (NOTE) SARS-CoV-2 target nucleic acids are DETECTED  SARS-CoV-2 RNA is generally detectable in upper respiratory specimens  during the acute phase of infection.  Positive results are indicative  of the presence of the identified  virus, but do not rule out bacterial infection or co-infection with other pathogens not detected by the test.  Clinical correlation with patient history and  other diagnostic information is necessary to determine patient infection status.  The expected result is negative.  Fact Sheet for Patients:   StrictlyIdeas.no   Fact Sheet for Healthcare Providers:   BankingDealers.co.za    This test is not yet approved or cleared by the [place] FDA and  has been authorized for detection and/or diagnosis of SARS-CoV-2 by FDA under an Emergency Use Authorization (EUA).  This EUA will remain in effect (meaning this test can be  used) for the duration of  the COVID-19 declaration under Section 564(b)(1) of the Act, 21 U.S.C. section 360-bbb-3(b)(1), unless the authorization is terminated or revoked sooner.  Performed at [facility], [place] [address] [place], [place] [address]   Blood Culture (routine x 2)     Status: None (Preliminary result)   Collection Time: 04/01/2020  2:32 PM   Specimen: BLOOD  RIGHT WRIST  Result Value Ref Range Status   Specimen Description BLOOD RIGHT WRIST  Final   Special Requests   Final    BOTTLES DRAWN AEROBIC AND ANAEROBIC Blood Culture results may not be optimal due to an inadequate volume of blood received in culture bottles   Culture   Final    NO GROWTH < 24 HOURS Performed at [facility], [place] [address] [place], [place] [address]    Report Status PENDING  Incomplete  Blood Culture (routine x 2)     Status: None (Preliminary result)   Collection Time: 03/12/2020  2:39 PM   Specimen: BLOOD  Result Value Ref Range Status   Specimen Description BLOOD RIGHT ANTECUBITAL  Final   Special Requests   Final    BOTTLES DRAWN AEROBIC AND ANAEROBIC Blood Culture results may not be optimal due to an inadequate volume of blood received in culture bottles   Culture   Final    NO GROWTH < 24 HOURS Performed at [facility], [place] [address], [place], [place] [address]    Report Status PENDING  Incomplete  Urine culture     Status: None   Collection Time: 04/03/2020  3:20 PM   Specimen: In/Out Cath Urine  Result Value Ref Range Status   Specimen Description IN/OUT CATH URINE  Final   Special Requests NONE  Final   Culture   Final    NO GROWTH Performed at [facility], [place] [address], [place], [place] [address]    Report Status 03/31/2020 FINAL  Final    Radiology Reports CT ANGIO HEAD W OR WO CONTRAST  Result Date: 03/16/2020 CLINICAL DATA:  Stroke. Dizziness and nausea 1 month. Left thalamic infarct on MRI today. EXAM: CT ANGIOGRAPHY HEAD AND NECK TECHNIQUE: Multidetector CT imaging of the head and neck was performed using the standard protocol during bolus administration of intravenous contrast. Multiplanar CT image reconstructions and MIPs were obtained to evaluate the vascular anatomy. Carotid stenosis measurements (when applicable) are obtained utilizing NASCET criteria, using the distal internal carotid diameter as the denominator.  CONTRAST:  14mL OMNIPAQUE IOHEXOL 350 MG/ML SOLN COMPARISON:  MRI head 03/16/2020 FINDINGS: CT HEAD FINDINGS Brain: Mild to moderate atrophy. Moderate white matter hypodensity diffusely and bilaterally consistent with chronic microvascular ischemia. Small acute infarct in the left thalamus on MRI is not visualized by CT of this point. No acute infarct or hemorrhage. No mass lesion identified.  Vascular: Normal arterial flow voids. Skull: Negative Sinuses: Paranasal sinuses clear. Orbits: Bilateral cataract extraction. Review of the MIP images confirms the above findings CTA NECK FINDINGS Aortic arch: Standard branching. Imaged portion shows no evidence of aneurysm or dissection. No significant stenosis of the major arch vessel origins. Right carotid system: Right carotid bifurcation widely patent. Approximately 1 cm above the bifurcation there is a calcified plaque in the right internal carotid artery narrowing the lumen by less than 25% diameter stenosis. Left carotid system: Minimal atherosclerotic calcification left carotid bifurcation without stenosis Vertebral arteries: Right vertebral artery dominant and patent to the basilar. Calcified plaque distal right vertebral artery with moderate stenosis. Moderate calcific stenosis origin of left vertebral artery mild stenosis distal left vertebral artery. Left vertebral artery is continuous to the basilar. Skeleton: Cervical spondylosis.  No acute skeletal abnormality. Other neck: Negative Upper chest: Lung apices clear bilaterally. Review of the MIP images confirms the above findings CTA HEAD FINDINGS Anterior circulation: Mild atherosclerotic calcification in the cavernous carotid bilaterally without significant stenosis. Anterior and middle cerebral arteries patent bilaterally without stenosis. Posterior circulation: Moderate calcific stenosis distal right vertebral artery the skull base. Mild stenosis distal left vertebral artery. PICA patent bilaterally. Basilar  widely patent. Small AICA patent bilaterally. Superior cerebellar and posterior cerebral arteries patent bilaterally. No significant stenosis or large vessel occlusion. Venous sinuses: Normal venous enhancement Anatomic variants: None Review of the MIP images confirms the above findings IMPRESSION: 1. No acute abnormality by CT. Small acute infarct left thalamus on MRI. There is atrophy and chronic microvascular ischemic changes in the white matter. 2. Mild atherosclerotic disease in the carotid artery in the neck bilaterally without significant stenosis. 3. 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 4. No intracranial large vessel occlusion. Electronically Signed   By: Franchot Gallo M.D.   On: 03/16/2020 20:40   DG Chest 2 View  Result Date: 03/16/2020 CLINICAL DATA:  Dizziness, nausea EXAM: CHEST - 2 VIEW COMPARISON:  None. FINDINGS: The heart size and mediastinal contours are within normal limits. Both lungs are clear. The visualized skeletal structures are unremarkable. IMPRESSION: Normal study. Electronically Signed   By: Rolm Baptise M.D.   On: 03/16/2020 19:46   CT Head Wo Contrast  Result Date: 03/16/2020 CLINICAL DATA:  Follow-up stroke, dizziness EXAM: CT HEAD WITHOUT CONTRAST TECHNIQUE: Contiguous axial images were obtained from the base of the skull through the vertex without intravenous contrast. COMPARISON:  Same day MR brain, 03/16/2020 FINDINGS: Brain: Extensive periventricular and deep white matter hypodensity. Subtle focal hypodensity of the left thalamus in keeping with acute diffusion restricting infarction seen on same day MR (series 2, image 16). No evidence of hemorrhage, hydrocephalus, extra-axial collection or mass lesion/mass effect. Vascular: No hyperdense vessel or unexpected calcification. Skull: Normal. Negative for fracture or focal lesion. Sinuses/Orbits: No acute finding. Other: None. IMPRESSION: 1. Subtle  focal hypodensity of the left thalamus in keeping with acute diffusion restricting infarction seen on same day MR. No evidence of hemorrhage. 2. Advanced small-vessel white matter disease. Electronically Signed   By: Eddie Candle M.D.   On: 03/16/2020 17:02   CT ANGIO NECK W OR WO CONTRAST  Result Date: 03/16/2020 CLINICAL DATA:  Stroke. Dizziness and nausea 1 month. Left thalamic infarct on MRI today. EXAM: CT ANGIOGRAPHY HEAD AND NECK TECHNIQUE: Multidetector CT imaging of the head and neck was performed using the standard protocol during bolus administration of intravenous contrast. Multiplanar  CT image reconstructions and MIPs were obtained to evaluate the vascular anatomy. Carotid stenosis measurements (when applicable) are obtained utilizing NASCET criteria, using the distal internal carotid diameter as the denominator. CONTRAST:  133mL OMNIPAQUE IOHEXOL 350 MG/ML SOLN COMPARISON:  MRI head 03/16/2020 FINDINGS: CT HEAD FINDINGS Brain: Mild to moderate atrophy. Moderate white matter hypodensity diffusely and bilaterally consistent with chronic microvascular ischemia. Small acute infarct in the left thalamus on MRI is not visualized by CT of this point. No acute infarct or hemorrhage. No mass lesion identified. Vascular: Normal arterial flow voids. Skull: Negative Sinuses: Paranasal sinuses clear. Orbits: Bilateral cataract extraction. Review of the MIP images confirms the above findings CTA NECK FINDINGS Aortic arch: Standard branching. Imaged portion shows no evidence of aneurysm or dissection. No significant stenosis of the major arch vessel origins. Right carotid system: Right carotid bifurcation widely patent. Approximately 1 cm above the bifurcation there is a calcified plaque in the right internal carotid artery narrowing the lumen by less than 25% diameter stenosis. Left carotid system: Minimal atherosclerotic calcification left carotid bifurcation without stenosis Vertebral arteries: Right vertebral  artery dominant and patent to the basilar. Calcified plaque distal right vertebral artery with moderate stenosis. Moderate calcific stenosis origin of left vertebral artery mild stenosis distal left vertebral artery. Left vertebral artery is continuous to the basilar. Skeleton: Cervical spondylosis.  No acute skeletal abnormality. Other neck: Negative Upper chest: Lung apices clear bilaterally. Review of the MIP images confirms the above findings CTA HEAD FINDINGS Anterior circulation: Mild atherosclerotic calcification in the cavernous carotid bilaterally without significant stenosis. Anterior and middle cerebral arteries patent bilaterally without stenosis. Posterior circulation: Moderate calcific stenosis distal right vertebral artery the skull base. Mild stenosis distal left vertebral artery. PICA patent bilaterally. Basilar widely patent. Small AICA patent bilaterally. Superior cerebellar and posterior cerebral arteries patent bilaterally. No significant stenosis or large vessel occlusion. Venous sinuses: Normal venous enhancement Anatomic variants: None Review of the MIP images confirms the above findings IMPRESSION: 1. No acute abnormality by CT. Small acute infarct left thalamus on MRI. There is atrophy and chronic microvascular ischemic changes in the white matter. 2. Mild atherosclerotic disease in the carotid artery in the neck bilaterally without significant stenosis. 3. 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 4. No intracranial large vessel occlusion. Electronically Signed   By: Franchot Gallo M.D.   On: 03/16/2020 20:40   CT ANGIO CHEST PE W OR WO CONTRAST  Result Date: 04/09/2020 CLINICAL DATA:  Shortness of breath. EXAM: CT ANGIOGRAPHY CHEST WITH CONTRAST TECHNIQUE: Multidetector CT imaging of the chest was performed using the standard protocol during bolus administration of intravenous contrast. Multiplanar CT image  reconstructions and MIPs were obtained to evaluate the vascular anatomy. CONTRAST:  91mL OMNIPAQUE IOHEXOL 350 MG/ML SOLN COMPARISON:  Abdomen pelvis CT, dated March 22, 2020. FINDINGS: Cardiovascular: There is mild to moderate severity calcification of the aortic arch and descending thoracic aorta. A small amount of intraluminal low attenuation is seen within a posterior subsegmental lower lobe branch of the right pulmonary artery (axial CT images 62 through 66, CT series number 5). Normal heart size. No pericardial effusion. Mediastinum/Nodes: No enlarged mediastinal, hilar, or axillary lymph nodes. Thyroid gland, trachea, and esophagus demonstrate no significant findings. Lungs/Pleura: Marked severity multifocal infiltrates are seen throughout both lungs. There is no evidence of a pleural effusion or pneumothorax. Upper Abdomen: There is a small hiatal hernia. Musculoskeletal: Numerous diverticula are seen throughout  the large bowel. Marked severity thickening of the distal transverse colon is seen. This area measures approximately 5.2 cm x 4.0 cm and is seen on the prior abdomen pelvis CT. Review of the MIP images confirms the above findings. IMPRESSION: 1. Marked severity multifocal infiltrates throughout both lungs. 2. Small amount of pulmonary embolism within a posterior subsegmental lower lobe branch of the right pulmonary artery. 3. Marked severity thickening of the distal transverse colon which is seen on the prior abdomen pelvis CT. While this may represent colitis, an underlying neoplastic process cannot be excluded. Correlation with colonoscopy is recommended. 4. Small hiatal hernia. 5. Colonic diverticulosis. 6. Aortic atherosclerosis. Aortic Atherosclerosis (ICD10-I70.0). Electronically Signed   By: Virgina Norfolk M.D.   On: 03/10/2020 17:20   MR CERVICAL SPINE W WO CONTRAST  Addendum Date: 03/25/2020   Addendum: There is a bilobed cystic structure in the ventral dens noted on the spinal  images but not covered by the axial views.  It had previously been noted on the sagittal images on MRI of the brain dated 03/16/2020.  It has smooth margins and does not enhance after contrast.  It does not appear aggressive and could be benign cysts or the sequela of chronic degeneration. -- RAS  Result Date: 03/24/2020  [facility] [address], [place], [place] [address] [phone] NEUROIMAGING REPORT STUDY DATE: 03/20/2020 PATIENT NAME: Kevontae Burgoon DOB: 03/13/1941 MRN: [id number] EXAM: MRI of the cervical spine with and without contrast ORDERING CLINICIAN: Marcial Pacas MD, PhD CLINICAL HISTORY: 79 year old man with abnormal cervical spine imaging COMPARISON FILMS: CT 06/05/2017 TECHNIQUE: MRI of the cervical spine was obtained utilizing 3 mm sagittal slices from the posterior fossa down to the T3-4 level with T1, T2 and inversion recovery views. In addition 4 mm axial slices from I6-9 down to T1-2 level were included with T2 and gradient echo views.  After the infusion contrast, additional T1-weighted images were performed. CONTRAST: 15 mL MultiHance IMAGING SITE: [facility], [place], [place], [place] FINDINGS: :  On sagittal images, the spine is imaged from above the cervicomedullary junction to T2.   Visible brain appears normal.  Paravertebral soft tissue appears normal.  The spinal cord is of normal caliber and signal.   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  There is 3 mm of retrolisthesis of C5 upon C6. The discs and interspaces were further evaluated on axial views from C2 to T1 as follows: C2-C3: There is disc bulging, uncovertebral spurring and ligamenta flava hypertrophy.  This causes mild foraminal narrowing but no nerve root compression. C3-C4: There is degenerative fusion some uncovertebral spurring is noted to the left causing mild left foraminal narrowing but there is no nerve root compression or spinal stenosis. C4-C5: There is  degenerative fusion.  There is no nerve root compression or spinal stenosis. C5-C6: There is mild spinal stenosis due to 3 mm retrolisthesis, bulging of the uncovered disc, uncovertebral spurring.  This causes mild foraminal narrowing but no nerve root compression. C6-C7: There is borderline spinal stenosis due to disc bulging, minimal uncovertebral spurring, mild right facet hypertrophy and ligamenta flava hypertrophy.  There is mild right foraminal narrowing but no nerve root compression. C7-T1: The disc and interspace appear normal. T1-T2: The disc and interspace appear normal. After the infusion of contrast, a normal enhancement pattern was observed.  No definite changes compared to the CT from 06/05/2017.   This MRI of the cervical spine with and without contrast shows the  following: 1.   The spinal cord appears normal. 2.   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 3.   At C2-C3, there are mild degenerative changes causing mild foraminal narrowing but no nerve root compression.  4.   At C5-C6, there is mild spinal stenosis due to retrolisthesis and other degenerative change.  There is mild foraminal narrowing but no nerve root compression. 5.   At C6-C7, there is borderline spinal stenosis due to degenerative changes there is mild right foraminal narrowing but no nerve root compression. 6.   Normal enhancement pattern. INTERPRETING PHYSICIAN: Richard A. Felecia Shelling, MD, PhD, FAAN Certified in  Neuroimaging by [employer]  DG Chest [place] 1 View  Result Date: 03/13/2020 CLINICAL DATA:  Hypoxia in a patient with COVID-19. EXAM: PORTABLE CHEST 1 VIEW COMPARISON:  PA and lateral chest 03/16/2020. FINDINGS: The patient has extensive multifocal airspace disease consistent with pneumonia. Heart size normal. No pneumothorax or pleural effusion. No acute or focal bony abnormality. IMPRESSION: Multifocal pneumonia. Electronically Signed   By: Inge Rise M.D.   On: 03/24/2020 14:34    MR BRAIN/IAC W WO CONTRAST  Result Date: 03/16/2020 CLINICAL DATA:  Vertigo of central origin. Dizziness; vertigo, central. Additional history provided by scanning technologist: Patient reports 3-4 months of dizziness and nausea. EXAM: MRI HEAD WITHOUT AND WITH CONTRAST TECHNIQUE: Multiplanar, multiecho pulse sequences of the brain and surrounding structures were obtained without and with intravenous contrast. CONTRAST:  36mL MULTIHANCE GADOBENATE DIMEGLUMINE 529 MG/ML IV SOLN COMPARISON:  CT cervical spine 06/05/2017. FINDINGS: Brain: Moderate generalized parenchymal atrophy. 9 mm focus of restricted diffusion consistent with acute infarction within the left thalamus. Moderate patchy T2/FLAIR hyperintensity within the cerebral white matter is nonspecific, but consistent with chronic small vessel ischemic disease. 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 No intracranial mass is identified. Specifically, no cerebellopontine angle mass or internal auditory canal lesion is demonstrated. Unremarkable appearance of the seventh and eighth cranial nerves bilaterally. No abnormal intracranial enhancement. There is no acute infarct. No chronic intracranial blood products. No extra-axial fluid collection. No midline shift. Vascular: Expected proximal arterial flow voids. Skull and upper cervical spine: 12 mm T1 hypointense lesion within the ventral aspect of the dens (series 5, image 13) (series 14, image 2). There is no appreciable lesion at this site on the prior CT cervical spine of 06/05/2017. Sinuses/Orbits: Bilateral lens replacements. Mild ethmoid sinus mucosal thickening. No significant mastoid effusion. 9 mm acute left thalamic infarct. These results were called by telephone at the time of interpretation on 03/16/2020 at 3:51 pm to provider Dr. Floyde Parkins, who verbally acknowledged these results. Indeterminate 12 mm lesion within the dens. These results will be called to the  ordering clinician or representative by the Radiologist Assistant, and communication documented in the PACS or [employer]. IMPRESSION: 9 mm acute left thalamic infarct. No cerebellopontine angle mass or internal auditory canal lesion is demonstrated. Unremarkable appearance of the seventh and eighth cranial nerves bilaterally. Moderate generalized parenchymal atrophy and chronic small vessel ischemic disease. 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 Indeterminate 12 mm lesion within the ventral aspect of the dens. Contrast-enhanced cervical spine MRI is recommended for further characterization, as clinically warranted. Mild ethmoid sinus mucosal thickening. Electronically Signed   By: Kellie Simmering DO   On: 03/16/2020 15:59   ECHOCARDIOGRAM COMPLETE  Result Date: 03/17/2020    ECHOCARDIOGRAM REPORT   Patient Name:   Deny Bakke Date  of Exam: 03/17/2020 Medical Rec #:  [id number]   Height:       68.0 in Accession #:    [id number]  Weight:       167.0 lb Date of Birth:  February 11, 1941   BSA:          1.893 m Patient Age:    79 years    BP:           170/76 mmHg Patient Gender: M           HR:           94 bpm. Exam Location:  Inpatient Procedure: 2D Echo and Intracardiac Opacification Agent Indications:    Stroke I163.9  History:        Patient has no prior history of Echocardiogram examinations.                 Risk Factors:Diabetes.  Sonographer:    Mikki Santee RDCS (AE) Referring Phys: [place]  1. Left ventricular ejection fraction, by estimation, is 65 to 70%. The left ventricle has normal function. The left ventricle has no regional wall motion abnormalities. Left ventricular diastolic parameters are consistent with Grade II diastolic dysfunction (pseudonormalization).  2. Right ventricular systolic function is normal. The right ventricular size is normal.  3. The mitral valve is normal in structure. No evidence of mitral valve regurgitation.  No evidence of mitral stenosis.  4. The aortic valve is grossly normal. Aortic valve regurgitation is trivial. No aortic stenosis is present. FINDINGS  Left Ventricle: Left ventricular ejection fraction, by estimation, is 65 to 70%. The left ventricle has normal function. The left ventricle has no regional wall motion abnormalities. Definity contrast agent was given IV to delineate the left ventricular  endocardial borders. The left ventricular internal cavity size was normal in size. There is no left ventricular hypertrophy. Left ventricular diastolic parameters are consistent with Grade II diastolic dysfunction (pseudonormalization). Right Ventricle: The right ventricular size is normal. No increase in right ventricular wall thickness. Right ventricular systolic function is normal. Left Atrium: Left atrial size was normal in size. Right Atrium: Right atrial size was normal in size. Pericardium: There is no evidence of pericardial effusion. Mitral Valve: The mitral valve is normal in structure. No evidence of mitral valve regurgitation. No evidence of mitral valve stenosis. Tricuspid Valve: The tricuspid valve is normal in structure. Tricuspid valve regurgitation is not demonstrated. Aortic Valve: The aortic valve is grossly normal. Aortic valve regurgitation is trivial. No aortic stenosis is present. Pulmonic Valve: The pulmonic valve was normal in structure. Pulmonic valve regurgitation is not visualized. Aorta: The aortic root and ascending aorta are structurally normal, with no evidence of dilitation. IAS/Shunts: The atrial septum is grossly normal.  LEFT VENTRICLE PLAX 2D LVIDd:         3.10 cm  Diastology LVIDs:         2.20 cm  LV e' lateral:   9.46 cm/s LV PW:         1.10 cm  LV E/e' lateral: 10.2 LV IVS:        0.90 cm  LV e' medial:    5.55 cm/s LVOT diam:     2.00 cm  LV E/e' medial:  17.4 LV SV:         58 LV SV Index:   31 LVOT Area:     3.14 cm  RIGHT VENTRICLE RV S prime:     18.50 cm/s TAPSE  (M-mode):  3.0 cm LEFT ATRIUM             Index       RIGHT ATRIUM           Index LA diam:        2.80 cm 1.48 cm/m  RA Area:     14.40 cm LA Vol (A2C):   44.3 ml 23.40 ml/m RA Volume:   31.50 ml  16.64 ml/m LA Vol (A4C):   56.5 ml 29.85 ml/m LA Biplane Vol: 55.4 ml 29.27 ml/m  AORTIC VALVE LVOT Vmax:   113.00 cm/s LVOT Vmean:  70.600 cm/s LVOT VTI:    0.186 m  AORTA Ao Root diam: 3.00 cm MITRAL VALVE MV Area (PHT): 3.89 cm    SHUNTS MV Decel Time: 195 msec    Systemic VTI:  0.19 m MV E velocity: 96.30 cm/s  Systemic Diam: 2.00 cm MV A velocity: 83.10 cm/s MV E/A ratio:  1.16 Mertie Moores MD Electronically signed by Mertie Moores MD Signature Date/Time: 03/17/2020/4:31:32 PM    Final    VAS [place] LOWER EXTREMITY VENOUS (DVT)  Result Date: 03/31/2020  Lower Venous DVT Study Indications: Covid-19, and pulmonary embolism.  Comparison Study: No prior study on file Performing Technologist: Sharion Dove RVS  Examination Guidelines: A complete evaluation includes B-mode imaging, spectral Doppler, color Doppler, and power Doppler as needed of all accessible portions of each vessel. Bilateral testing is considered an integral part of a complete examination. Limited examinations for reoccurring indications may be performed as noted. The reflux portion of the exam is performed with the patient in reverse Trendelenburg.  +---------+---------------+---------+-----------+----------+--------------+ RIGHT    CompressibilityPhasicitySpontaneityPropertiesThrombus Aging +---------+---------------+---------+-----------+----------+--------------+ CFV      Full           Yes      Yes                                 +---------+---------------+---------+-----------+----------+--------------+ SFJ      Full                                                        +---------+---------------+---------+-----------+----------+--------------+ FV Prox  Full                                                         +---------+---------------+---------+-----------+----------+--------------+ FV Mid   Full                                                        +---------+---------------+---------+-----------+----------+--------------+ FV DistalFull                                                        +---------+---------------+---------+-----------+----------+--------------+ PFV      Full                                                        +---------+---------------+---------+-----------+----------+--------------+  POP      Full           Yes      Yes                                 +---------+---------------+---------+-----------+----------+--------------+ PTV      None                                         Acute          +---------+---------------+---------+-----------+----------+--------------+ PERO     Full                                                        +---------+---------------+---------+-----------+----------+--------------+   +---------+---------------+---------+-----------+----------+--------------+ LEFT     CompressibilityPhasicitySpontaneityPropertiesThrombus Aging +---------+---------------+---------+-----------+----------+--------------+ CFV      Full           Yes      Yes                                 +---------+---------------+---------+-----------+----------+--------------+ SFJ      Full                                                        +---------+---------------+---------+-----------+----------+--------------+ FV Prox  Full                                                        +---------+---------------+---------+-----------+----------+--------------+ FV Mid   Full                                                        +---------+---------------+---------+-----------+----------+--------------+ FV DistalFull                                                         +---------+---------------+---------+-----------+----------+--------------+ PFV      Full                                                        +---------+---------------+---------+-----------+----------+--------------+ POP      Full           Yes      Yes                                 +---------+---------------+---------+-----------+----------+--------------+  PTV      None                                         Acute          +---------+---------------+---------+-----------+----------+--------------+ PERO     None                                         Acute          +---------+---------------+---------+-----------+----------+--------------+     Summary: RIGHT: - Findings consistent with acute deep vein thrombosis involving the right posterior tibial veins.  LEFT: - Findings consistent with acute deep vein thrombosis involving the left posterior tibial veins, and left peroneal veins.  *See table(s) above for measurements and observations.    Preliminary

## 2020-03-31 NOTE — ED Notes (Addendum)
Pt asked for assistance with urinal RN at bedside to assist pt w urinal. Pt asking for headache medicine, will medicate shortly.

## 2020-03-31 NOTE — ED Notes (Signed)
Pt's CBG result was 280. Informed Becky - RN.

## 2020-03-31 NOTE — ED Notes (Signed)
Pt's pulse ox displaying 83-84% - pulse ox moved to other ear, forehead, and then finger - sats 92%.

## 2020-03-31 NOTE — ED Notes (Signed)
MD in w/pt. Non-rebreather removed.

## 2020-03-31 NOTE — ED Notes (Signed)
CBG: 176 °

## 2020-03-31 NOTE — ED Notes (Signed)
Pt to bedside commode - BM - pt's sats 70's - Pt assisted back to stretcher - Non-rebreather added.

## 2020-03-31 NOTE — ED Notes (Addendum)
Pt complains of dyspnea, states "I feel like I cannot breathe". RN went into room, assessed pt, called RT to come assess pt as well. Pt put on non-rebreather by RT

## 2020-03-31 NOTE — ED Notes (Signed)
Pt c/o chronic back pain and headache - Ultram given. C/o nonprod cough - Tussionex given. Pt also c/o dry, burning eyes - Dr Lovena Neighbours notified - new order received. Pt assisted w/repositioning. Pt eating meal brought by family member.

## 2020-04-01 ENCOUNTER — Encounter (HOSPITAL_COMMUNITY): Payer: Self-pay | Admitting: Internal Medicine

## 2020-04-01 LAB — CBC WITH DIFFERENTIAL/PLATELET
Abs Immature Granulocytes: 0.24 10*3/uL — ABNORMAL HIGH (ref 0.00–0.07)
Basophils Absolute: 0 10*3/uL (ref 0.0–0.1)
Basophils Relative: 0 %
Eosinophils Absolute: 0 10*3/uL (ref 0.0–0.5)
Eosinophils Relative: 0 %
HCT: 24.5 % — ABNORMAL LOW (ref 39.0–52.0)
Hemoglobin: 7.8 g/dL — ABNORMAL LOW (ref 13.0–17.0)
Immature Granulocytes: 2 %
Lymphocytes Relative: 3 %
Lymphs Abs: 0.4 10*3/uL — ABNORMAL LOW (ref 0.7–4.0)
MCH: 24.1 pg — ABNORMAL LOW (ref 26.0–34.0)
MCHC: 31.8 g/dL (ref 30.0–36.0)
MCV: 75.6 fL — ABNORMAL LOW (ref 80.0–100.0)
Monocytes Absolute: 0.6 10*3/uL (ref 0.1–1.0)
Monocytes Relative: 5 %
Neutro Abs: 10.5 10*3/uL — ABNORMAL HIGH (ref 1.7–7.7)
Neutrophils Relative %: 90 %
Platelets: 393 10*3/uL (ref 150–400)
RBC: 3.24 MIL/uL — ABNORMAL LOW (ref 4.22–5.81)
RDW: 16.2 % — ABNORMAL HIGH (ref 11.5–15.5)
WBC: 11.7 10*3/uL — ABNORMAL HIGH (ref 4.0–10.5)
nRBC: 0 % (ref 0.0–0.2)

## 2020-04-01 LAB — COMPREHENSIVE METABOLIC PANEL
ALT: 29 U/L (ref 0–44)
AST: 28 U/L (ref 15–41)
Albumin: 1.6 g/dL — ABNORMAL LOW (ref 3.5–5.0)
Alkaline Phosphatase: 187 U/L — ABNORMAL HIGH (ref 38–126)
Anion gap: 10 (ref 5–15)
BUN: 30 mg/dL — ABNORMAL HIGH (ref 8–23)
CO2: 24 mmol/L (ref 22–32)
Calcium: 7.8 mg/dL — ABNORMAL LOW (ref 8.9–10.3)
Chloride: 99 mmol/L (ref 98–111)
Creatinine, Ser: 1.19 mg/dL (ref 0.61–1.24)
GFR calc Af Amer: >60 mL/min (ref >60)
GFR calc non Af Amer: 57 mL/min — ABNORMAL LOW (ref >60)
Glucose, Bld: 210 mg/dL — ABNORMAL HIGH (ref 70–99)
Potassium: 3.9 mmol/L (ref 3.5–5.1)
Sodium: 133 mmol/L — ABNORMAL LOW (ref 135–145)
Total Bilirubin: 0.5 mg/dL (ref 0.3–1.2)
Total Protein: 5.2 g/dL — ABNORMAL LOW (ref 6.5–8.1)

## 2020-04-01 LAB — D-DIMER, QUANTITATIVE: D-Dimer, Quant: 4.55 ug/mL-FEU — ABNORMAL HIGH (ref 0.00–0.50)

## 2020-04-01 LAB — GLUCOSE, CAPILLARY
Glucose-Capillary: 199 mg/dL — ABNORMAL HIGH (ref 70–99)
Glucose-Capillary: 250 mg/dL — ABNORMAL HIGH (ref 70–99)

## 2020-04-01 LAB — MAGNESIUM: Magnesium: 2.3 mg/dL (ref 1.7–2.4)

## 2020-04-01 LAB — C-REACTIVE PROTEIN: CRP: 18.8 mg/dL — ABNORMAL HIGH (ref <1.0)

## 2020-04-01 LAB — PROCALCITONIN: Procalcitonin: 3.06 ng/mL

## 2020-04-01 LAB — FERRITIN: Ferritin: 87 ng/mL (ref 24–336)

## 2020-04-01 MED ORDER — MORPHINE SULFATE (PF) 2 MG/ML IV SOLN
2.0000 mg | INTRAVENOUS | Status: DC | PRN
  Administered 2020-04-01: 2 mg via INTRAVENOUS
  Filled 2020-04-01: qty 1

## 2020-04-01 MED ORDER — LORAZEPAM 1 MG PO TABS: 1.0000 mg | ORAL_TABLET | ORAL | Status: DC | PRN

## 2020-04-01 MED ORDER — LORAZEPAM 2 MG/ML IJ SOLN
0.5000 mg | Freq: Once | INTRAMUSCULAR | Status: AC
  Administered 2020-04-01: 0.5 mg via INTRAVENOUS
  Filled 2020-04-01: qty 1

## 2020-04-01 MED ORDER — ONDANSETRON 4 MG PO TBDP: 4.0000 mg | ORAL_TABLET | Freq: Four times a day (QID) | ORAL | Status: DC | PRN

## 2020-04-01 MED ORDER — DIPHENHYDRAMINE HCL 50 MG/ML IJ SOLN: 12.5000 mg | INTRAMUSCULAR | Status: DC | PRN

## 2020-04-01 MED ORDER — MORPHINE 100MG IN NS 100ML (1MG/ML) PREMIX INFUSION
1.0000 mg/h | INTRAVENOUS | Status: DC
  Administered 2020-04-01: 1 mg/h via INTRAVENOUS
  Filled 2020-04-01: qty 100

## 2020-04-01 MED ORDER — MORPHINE SULFATE (CONCENTRATE) 10 MG/0.5ML PO SOLN: 5.0000 mg | ORAL | Status: DC | PRN

## 2020-04-01 MED ORDER — ONDANSETRON HCL 4 MG/2ML IJ SOLN: 4.0000 mg | Freq: Four times a day (QID) | INTRAMUSCULAR | Status: DC | PRN

## 2020-04-01 MED ORDER — LORAZEPAM 2 MG/ML PO CONC: 1.0000 mg | ORAL | Status: DC | PRN

## 2020-04-01 MED ORDER — HALOPERIDOL LACTATE 2 MG/ML PO CONC
0.5000 mg | ORAL | Status: DC | PRN
  Filled 2020-04-01: qty 0.3

## 2020-04-01 MED ORDER — SODIUM CHLORIDE 0.9% FLUSH: 3.0000 mL | INTRAVENOUS | Status: DC | PRN

## 2020-04-01 MED ORDER — LORAZEPAM 2 MG/ML IJ SOLN
1.0000 mg | INTRAMUSCULAR | Status: DC | PRN
  Administered 2020-04-01 – 2020-04-02 (×2): 1 mg via INTRAVENOUS
  Filled 2020-04-01: qty 1

## 2020-04-01 MED ORDER — SODIUM CHLORIDE 0.9% FLUSH
3.0000 mL | Freq: Two times a day (BID) | INTRAVENOUS | Status: DC
  Administered 2020-04-01 (×2): 3 mL via INTRAVENOUS

## 2020-04-01 MED ORDER — SODIUM CHLORIDE 0.9 % IV SOLN: 250.0000 mL | INTRAVENOUS | Status: DC | PRN

## 2020-04-01 MED ORDER — HALOPERIDOL 0.5 MG PO TABS
0.5000 mg | ORAL_TABLET | ORAL | Status: DC | PRN
  Filled 2020-04-01: qty 1

## 2020-04-01 MED ORDER — SODIUM CHLORIDE 0.9 % IV SOLN
2.0000 g | INTRAVENOUS | Status: DC
  Administered 2020-04-01: 2 g via INTRAVENOUS
  Filled 2020-04-01: qty 20

## 2020-04-01 MED ORDER — HALOPERIDOL LACTATE 5 MG/ML IJ SOLN
0.5000 mg | INTRAMUSCULAR | Status: DC | PRN
  Administered 2020-04-01 – 2020-04-02 (×2): 0.5 mg via INTRAVENOUS
  Filled 2020-04-01 (×2): qty 1

## 2020-04-01 MED ORDER — ATROPINE SULFATE 1 % OP SOLN
4.0000 [drp] | OPHTHALMIC | Status: DC | PRN
  Filled 2020-04-01: qty 2

## 2020-04-01 MED ORDER — ENOXAPARIN SODIUM 80 MG/0.8ML ~~LOC~~ SOLN: 80.0000 mg | Freq: Two times a day (BID) | SUBCUTANEOUS | Status: DC

## 2020-04-01 MED ORDER — LORAZEPAM 2 MG/ML IJ SOLN
1.0000 mg | INTRAMUSCULAR | Status: DC | PRN
  Filled 2020-04-01: qty 1

## 2020-04-01 NOTE — Progress Notes (Addendum)
Spoke with the patient's daughter-Thomas Adkins over the phone-she has had a chance to discuss with numerous other family members-they understand poor prognosis and that the patient is continued to deteriorate.  They want him home ASAP (do not want residential hospice).  She is aware that patient will require quite a bit amount of symptom control but is willing to provide all the care that she can at home.  She claims that the patient would not want to die in the hospital-and at this point-family is trying to do everything possible to get him home.  Goals of care the following:  DNR Transition to comfort care Family requesting that he be sent home as soon as possible with home hospice.  Have touched base with the social worker-who will get in touch with hospice agency and family-tentative plan is for discharge with home hospice tomorrow. Family/daughter also aware that there is a small chance that while we are arranging for home hospice and starting comfort care-that patient may pass while hospitalized.

## 2020-04-01 NOTE — Evaluation (Signed)
Occupational Therapy Evaluation Patient Details Name: Thomas Adkins MRN: [id number] DOB: September 12, 1940 Today's Date: 04/01/2020    History of Present Illness 79 y.o. male presenting with shortness of breath and tested positive for COVID-19. PMH including hypertension, hyperlipidemia, type 2 diabetes mellitus, CVA, GERD, and arthritis.   Clinical Impression   PTA, pt was living with his wife and was independent; per pt report and chart review. Pt presenting with decreased orientation, balance, strength, and activity tolerance. Pt requiring Min A for UB ADLs, Max A for LB ADLs, and Min-Mod A +2 for functional transfers. Pt would benefit from further acute OT to facilitate safe dc. Recommend dc to SNF for further OT to optimize safety, independence with ADLs, and return to PLOF.   Vitals: SpO2 96% on 15L via HFNC and 15L via NRB. HR 102. RR 20.  BP 134/83. Upon sitting at EOB, SpO2 dropping to 86, HR 138, and RR 38.  In standing SpO2 dropping into 70s.  Once seated in recliner with rest break, SpO2 94%, HR 117, and BP 153/86.    Follow Up Recommendations  SNF;Supervision/Assistance - 24 hour    Equipment Recommendations  Other (comment) (Defer to next venue)    Recommendations for Other Services PT consult     Precautions / Restrictions Precautions Precautions: Fall Precaution Comments: dizziness, watch BP Restrictions Weight Bearing Restrictions: No      Mobility Bed Mobility Overal bed mobility: Needs Assistance Bed Mobility: Supine to Sit     Supine to sit: Min assist;+2 for physical assistance;HOB elevated     General bed mobility comments: Pt reaching out for therapist's hand to then pull up into upright posture. Second person for support at trunk behind.   Transfers Overall transfer level: Needs assistance Equipment used: None;Straight cane Transfers: Sit to/from [facility] Sit to Stand: Min assist;+2 physical assistance;+2 safety/equipment Stand  pivot transfers: Mod assist;+2 physical assistance;From elevated surface       General transfer comment: Min A +2 to power up into standing. Two person hand held for support in standing. Mod A +2 for balance during pivot to recliner as pt fatigues quickly    Balance Overall balance assessment: History of Falls;Needs assistance Sitting-balance support: Feet supported;No upper extremity supported Sitting balance-Leahy Scale: Good     Standing balance support: Bilateral upper extremity supported;During functional activity Standing balance-Leahy Scale: Poor Standing balance comment: Reliant on UE support                           ADL either performed or assessed with clinical judgement   ADL Overall ADL's : Needs assistance/impaired Eating/Feeding: Minimal assistance;Bed level;Sitting   Grooming: Minimal assistance;Sitting;Bed level   Upper Body Bathing: Minimal assistance;Sitting   Lower Body Bathing: Maximal assistance;Sit to/from stand   Upper Body Dressing : Minimal assistance;Sitting   Lower Body Dressing: Maximal assistance;Sit to/from stand   Toilet Transfer: Moderate assistance;+2 for physical assistance;Stand-pivot           Functional mobility during ADLs: Moderate assistance;+2 for physical assistance (stand pivot) General ADL Comments: Pt presenting with confusion, decreased strength, poor balance, and poor activity tolerance.      Vision Baseline Vision/History: Wears glasses Wears Glasses: At all times Patient Visual Report: No change from baseline       Perception     Praxis      Pertinent Vitals/Pain       Hand Dominance Right   Extremity/Trunk Assessment Upper Extremity Assessment Upper  Extremity Assessment: Generalized weakness   Lower Extremity Assessment Lower Extremity Assessment: Defer to PT evaluation   Cervical / Trunk Assessment Cervical / Trunk Assessment: Kyphotic   Communication Communication Communication: No  difficulties   Cognition Arousal/Alertness: Awake/alert Behavior During Therapy: Flat affect Overall Cognitive Status: Impaired/Different from baseline Area of Impairment: Orientation;Attention;Memory;Following commands;Safety/judgement;Awareness;Problem solving                 Orientation Level: Disoriented to;Place;Time;Situation Current Attention Level: Focused Memory: Decreased recall of precautions;Decreased short-term memory Following Commands: Follows one step commands inconsistently;Follows one step commands with increased time Safety/Judgement: Decreased awareness of safety;Decreased awareness of deficits Awareness: Intellectual Problem Solving: Slow processing;Difficulty sequencing;Requires verbal cues General Comments: Pt with decreased orientation and repeating that he needs to "go pee". However, pt unable to comprehend use of urinal despite position (supine, sitting, and standing) and with Max cues. Following simple cues for bed mobility and transfer.   General Comments  SpO2 96% on 15L via HFNC and 15L via NRB. HR 102. RR 20.  Upon sitting at EOB, SpO2 dropping to 86, HR 138, and RR 38. In standing SpO2 dropping into 70s. Once seated in recliner and rested, SpO2 94%, HR 117, and BP 153/86    Exercises     Shoulder Instructions      Home Living Family/patient expects to be discharged to:: Private residence Living Arrangements: Spouse/significant other Available Help at Discharge: Family;Available 24 hours/day Type of Home: House Home Access: Ramped entrance     Home Layout: Two level;Able to live on main level with bedroom/bathroom Alternate Level Stairs-Number of Steps: 1 flight   Bathroom Shower/Tub: Tub/shower unit         Home Equipment: Cane - single point;Shower seat;Grab bars - tub/shower;Walker - 2 wheels;Bedside commode;Wheelchair - [employer] - 4 wheels   Additional Comments: pt confused; info from recent 03/17/20 evaluation      Prior  Functioning/Environment Level of Independence: Independent with assistive device(s)        Comments: From recent 03/17/20 evaluation: Drives. does own ADls and some IADLs. Uses walking stick for ambulation. Reports feeling nauseous when up walking for ~30 mintes. Tried OPPT and was not able to tolerate it due to feeling nauseous. Reports 1 fall stubbing toe.        OT Problem List: Decreased strength;Decreased range of motion;Decreased activity tolerance;Impaired balance (sitting and/or standing);Decreased cognition;Decreased safety awareness;Decreased knowledge of use of DME or AE;Decreased knowledge of precautions;Cardiopulmonary status limiting activity      OT Treatment/Interventions: Self-care/ADL training;Therapeutic exercise;Energy conservation;DME and/or AE instruction;Therapeutic activities;Patient/family education    OT Goals(Current goals can be found in the care plan section) Acute Rehab OT Goals Patient Stated Goal: Unstated OT Goal Formulation: Patient unable to participate in goal setting Time For Goal Achievement: 04/15/20 Potential to Achieve Goals: Good  OT Frequency: Min 2X/week   Barriers to D/C:            Co-evaluation PT/OT/SLP Co-Evaluation/Treatment: Yes Reason for Co-Treatment: Necessary to address cognition/behavior during functional activity;Complexity of the patient's impairments (multi-system involvement);Other (comment) (decr pulmonary status/tolerance) PT goals addressed during session: Mobility/safety with mobility;Balance        AM-PAC OT "6 Clicks" Daily Activity     Outcome Measure Help from another person eating meals?: A Little Help from another person taking care of personal grooming?: A Little Help from another person toileting, which includes using toliet, bedpan, or urinal?: A Lot Help from another person bathing (including washing, rinsing, drying)?: A Lot Help from another  person to put on and taking off regular upper body clothing?:  A Little Help from another person to put on and taking off regular lower body clothing?: A Lot 6 Click Score: 15   End of Session Equipment Utilized During Treatment: Oxygen (15 via HFNC and 15 via NRB) Nurse Communication: Mobility status;Other (comment) (Needs new condom cath; off upon arrival and bed soiled)  Activity Tolerance: Patient limited by fatigue Patient left: in chair;with call bell/phone within reach;with chair alarm set  OT Visit Diagnosis: Unsteadiness on feet (R26.81);Other abnormalities of gait and mobility (R26.89);Muscle weakness (generalized) (M62.81);Other symptoms and signs involving cognitive function                Time: 1050-1127 OT Time Calculation (min): 37 min Charges:  OT General Charges $OT Visit: 1 Visit OT Evaluation $OT Eval Moderate Complexity: Russell, OTR/L Acute Rehab Pager: [phone] Office: [place] 04/01/2020, 1:28 PM

## 2020-04-01 NOTE — TOC Initial Note (Signed)
Transition of Care [facility]) - Initial/Assessment Note    Patient Details  Name: Thomas Adkins MRN: [id number] Date of Birth: [birth year]  [facility]) CM/SW Contact:    Benard Halsted, LCSW Phone Number: 04/01/2020, 4:37 PM  Clinical Narrative:                 CSW received consult regarding hospice services at home. CSW spoke with patient's daughter, Anderson Malta, who is acting as spokesperson for the family. They are going to visit patient. Daughter requests hospice at home and not residential hospice. They live in [facility] [place] and prefer [place]. Referral made and their liaison, Vito Backers, will contact daughter to order needed DME such as oxygen and hospital bed.   Expected Discharge Plan: Home w Hospice Care Barriers to Discharge: Equipment Delay, Continued Medical Work up   Patient Goals and CMS Choice Patient states their goals for this hospitalization and ongoing recovery are:: Comfort CMS Medicare.gov Compare Post Acute Care list provided to:: Patient Represenative (must comment) (Daughter) Choice offered to / list presented to : Adult Children  Expected Discharge Plan and Services Expected Discharge Plan: Home w Hospice Care In-house Referral: Clinical Social Work, Hospice / Palliative Care Discharge Planning Services: CM Consult Post Acute Care Choice: Hospice Living arrangements for the past 2 months: Single Family Home                                      Prior Living Arrangements/Services Living arrangements for the past 2 months: Single Family Home Lives with:: Spouse Patient language and need for interpreter reviewed:: Yes Do you feel safe going back to the place where you live?: Yes      Need for Family Participation in Patient Care: Yes (Comment) Care giver support system in place?: Yes (comment)   Criminal Activity/Legal Involvement Pertinent to Current Situation/Hospitalization: No - Comment as needed  Activities of Daily  Living Home Assistive Devices/Equipment: Cane (specify quad or straight) ADL Screening (condition at time of admission) Patient's cognitive ability adequate to safely complete daily activities?: Yes Is the patient deaf or have difficulty hearing?: No Does the patient have difficulty seeing, even when wearing glasses/contacts?: Yes Does the patient have difficulty concentrating, remembering, or making decisions?: No Patient able to express need for assistance with ADLs?: Yes Does the patient have difficulty dressing or bathing?: No Independently performs ADLs?: Yes (appropriate for developmental age) Does the patient have difficulty walking or climbing stairs?: No Weakness of Legs: None Weakness of Arms/Hands: None  Permission Sought/Granted Permission sought to share information with : Facility Sport and exercise psychologist, Family Supports Permission granted to share information with : No  Share Information with NAME: Anderson Malta  Permission granted to share info w AGENCY: Hospice  Permission granted to share info w Relationship: Daughter  Permission granted to share info w Contact Information: [phone]  Emotional Assessment   Attitude/Demeanor/Rapport: Unable to Assess Affect (typically observed): Unable to Assess Orientation: : Fluctuating Orientation (Suspected and/or reported Sundowners) Alcohol / Substance Use: Not Applicable Psych Involvement: No (comment)  Admission diagnosis:  Acute respiratory failure with hypoxia ([place]) [J96.01] Acute hypoxemic respiratory failure due to COVID-19 ([facility]) [U07.1, J96.01] Pneumonia due to COVID-19 virus [U07.1, J12.82] Patient Active Problem List   Diagnosis Date Noted  . Acute hypoxemic respiratory failure due to COVID-19 ([place]) 03/28/2020  . Microcytic anemia 03/28/2020  . Thrombocytosis ([place]) 03/20/2020  . Abnormal MRI, cervical spine  03/18/2020  . CVA (cerebral vascular accident) ([place]) 03/16/2020  . Chronic bilateral low back pain  05/24/2019  . Gastroesophageal reflux disease without esophagitis 05/24/2019  . 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 High blood pressure 06/23/2016  . Diabetes ([place]) 06/23/2016   PCP:  Glenda Chroman, MD Pharmacy:   [facility], [place] [address] [place] [address] Phone: [phone] Fax: [facility] [place], [place] [address] Phone: [phone] Fax: [phone]     Social Determinants of Health (SDOH) Interventions    Readmission Risk Interventions Readmission Risk Prevention Plan 04/01/2020  Transportation Screening Complete  PCP or Specialist Appt within 3-5 Days Complete  HRI or Home Care Consult Complete  Social Work Consult for [place] Planning/Counseling Complete  Palliative Care Screening Complete  Medication Review (RN Care Manager) Referral to Pharmacy  Some recent data might be hidden

## 2020-04-01 NOTE — Evaluation (Signed)
Physical Therapy Evaluation Patient Details Name: Thomas Adkins MRN: [id number] DOB: [birth year] Today's Date: 04/01/2020   History of Present Illness  79 y.o. male presenting with shortness of breath and tested positive for COVID-19. PMH including hypertension, hyperlipidemia, type 2 diabetes mellitus, CVA, GERD, and arthritis.  Clinical Impression   Pt admitted with above diagnosis. Patient has had a significant decline in function since his last hospitalization in early August. He is now confused and requiring high level of O2 for minimal mobility (15L HFNC plus 15L NRB). His saturation level initially increased with transition from supine to standing, however with fatigue (he was trying to use the urinal) he dropped to 71%. He recovered after several minutes sitting. Unclear that he has the caregiver support to return home in current state. Pt currently with functional limitations due to the deficits listed below (see PT Problem List). Pt will benefit from skilled PT to increase their independence and safety with mobility to allow discharge to the venue listed below.       Follow Up Recommendations SNF;Supervision/Assistance - 24 hour Unless family can provide 24/7 care.    Equipment Recommendations  None recommended by PT    Recommendations for Other Services       Precautions / Restrictions Precautions Precautions: Fall Precaution Comments: dizziness, watch BP Restrictions Weight Bearing Restrictions: No      Mobility  Bed Mobility Overal bed mobility: Needs Assistance Bed Mobility: Supine to Sit     Supine to sit: Min assist;+2 for physical assistance;HOB elevated Sit to supine: Supervision;HOB elevated   General bed mobility comments: Pt reaching out for therapist's hand to then pull up into upright posture. Second person for support at trunk behind.   Transfers Overall transfer level: Needs assistance Equipment used: None;Straight cane Transfers: Sit to/from  [facility] Sit to Stand: Min assist;+2 physical assistance;+2 safety/equipment Stand pivot transfers: Mod assist;+2 physical assistance;From elevated surface       General transfer comment: Min A +2 to power up into standing. Two person hand held for support in standing. Mod A +2 for balance during pivot to recliner as pt fatigues quickly  Ambulation/Gait             General Gait Details: pivotal steps only due to decr sats  Stairs            Wheelchair Mobility    Modified Rankin (Stroke Patients Only) Modified Rankin (Stroke Patients Only) Pre-Morbid Rankin Score: Moderate disability Modified Rankin: Moderately severe disability     Balance Overall balance assessment: History of Falls;Needs assistance Sitting-balance support: Feet supported;No upper extremity supported Sitting balance-Leahy Scale: Good     Standing balance support: Bilateral upper extremity supported;During functional activity Standing balance-Leahy Scale: Poor Standing balance comment: Reliant on UE support                             Pertinent Vitals/Pain Pain Assessment: No/denies pain    Home Living Family/patient expects to be discharged to:: Private residence Living Arrangements: Spouse/significant other Available Help at Discharge: Family;Available 24 hours/day Type of Home: House Home Access: Ramped entrance     Home Layout: Two level;Able to live on main level with bedroom/bathroom Home Equipment: Kasandra Knudsen - single point;Shower seat;Grab bars - tub/shower;Walker - 2 wheels;Bedside commode;Wheelchair - [employer] - 4 wheels Additional Comments: pt confused; info from recent 03/17/20 evaluation    Prior Function Level of Independence: Independent with assistive device(s)  Comments: From recent 03/17/20 evaluation: Drives. does own ADls and some IADLs. Uses walking stick for ambulation. Reports feeling nauseous when up walking for ~30 mintes.  Tried OPPT and was not able to tolerate it due to feeling nauseous. Reports 1 fall stubbing toe.     Hand Dominance   Dominant Hand: Right    Extremity/Trunk Assessment   Upper Extremity Assessment Upper Extremity Assessment: Defer to OT evaluation    Lower Extremity Assessment Lower Extremity Assessment: Generalized weakness    Cervical / Trunk Assessment Cervical / Trunk Assessment: Kyphotic  Communication   Communication: No difficulties  Cognition Arousal/Alertness: Awake/alert Behavior During Therapy: Flat affect Overall Cognitive Status: Impaired/Different from baseline Area of Impairment: Orientation;Attention;Memory;Following commands;Safety/judgement;Awareness;Problem solving                 Orientation Level: Disoriented to;Place;Time;Situation Current Attention Level: Focused Memory: Decreased recall of precautions;Decreased short-term memory Following Commands: Follows one step commands inconsistently;Follows one step commands with increased time Safety/Judgement: Decreased awareness of safety;Decreased awareness of deficits Awareness: Intellectual Problem Solving: Slow processing;Difficulty sequencing;Requires verbal cues General Comments: Pt with decreased orientation and repeating that he needs to "go pee". However, pt unable to comprehend use of urinal despite position (supine, sitting, and standing) and with Max cues. Following simple cues for bed mobility and transfer.      General Comments General comments (skin integrity, edema, etc.): SpO2 96% on 15L via HFNC and 15L via NRB. HR 102. RR 20.  Upon sitting at EOB, SpO2 dropping to 86, HR 138, and RR 38. In standing SpO2 dropping into 70s. Once seated in recliner and rested, SpO2 94%, HR 117, and BP 153/86    Exercises     Assessment/Plan    PT Assessment Patient needs continued PT services  PT Problem List Decreased strength;Decreased mobility;Decreased balance;Decreased activity  tolerance;Decreased cognition;Decreased knowledge of use of DME;Decreased safety awareness;Cardiopulmonary status limiting activity       PT Treatment Interventions Therapeutic activities;Gait training;Therapeutic exercise;Patient/family education;Balance training;Functional mobility training;Stair training;Neuromuscular re-education    PT Goals (Current goals can be found in the Care Plan section)  Acute Rehab PT Goals Patient Stated Goal: Unstated PT Goal Formulation: Patient unable to participate in goal setting Time For Goal Achievement: 04/15/20 Potential to Achieve Goals: Good    Frequency Min 3X/week   Barriers to discharge Decreased caregiver support      Co-evaluation PT/OT/SLP Co-Evaluation/Treatment: Yes Reason for Co-Treatment: Complexity of the patient's impairments (multi-system involvement);Necessary to address cognition/behavior during functional activity;For patient/therapist safety;To address functional/ADL transfers PT goals addressed during session: Mobility/safety with mobility;Balance         AM-PAC PT "6 Clicks" Mobility  Outcome Measure Help needed turning from your back to your side while in a flat bed without using bedrails?: A Little Help needed moving from lying on your back to sitting on the side of a flat bed without using bedrails?: A Little Help needed moving to and from a bed to a chair (including a wheelchair)?: A Lot Help needed standing up from a chair using your arms (e.g., wheelchair or bedside chair)?: A Little Help needed to walk in hospital room?: Total Help needed climbing 3-5 steps with a railing? : Total 6 Click Score: 13    End of Session   Activity Tolerance: Patient limited by fatigue;Treatment limited secondary to medical complications (Comment) (decr sats) Patient left: with call bell/phone within reach;in chair;with chair alarm set Nurse Communication: Mobility status;Other (comment) (needs condom catheter replaced (off on  arrival)) PT  Visit Diagnosis: Muscle weakness (generalized) (M62.81);Difficulty in walking, not elsewhere classified (R26.2)    Time: 1583-0940 PT Time Calculation (min) (ACUTE ONLY): 37 min   Charges:   PT Evaluation $PT Eval Moderate Complexity: 1 Mod           Arby Barrette, PT Pager [phone]   Rexanne Mano 04/01/2020, 2:10 PM

## 2020-04-01 NOTE — Progress Notes (Signed)
PROGRESS NOTE                                                                                                                                                                                                             Patient Demographics:    Thomas Adkins, is a 79 y.o. male, DOB - 1940-11-19, NLG:[id number]  Outpatient Primary MD for the patient is Glenda Chroman, MD   Admit date - 03/23/2020   LOS - 2  No chief complaint on file.      Brief Narrative: Patient is a 79 y.o. male with PMHx of HTN, HLD, DM-2, GERD, recent hospitalization for CVA-diagnosed with COVID-19 on 8/12-subsequently hospitalized in [place] for a few days-discharged home in a stable manner-presented to [facility] on 8/21 with almost 10-day history of cough, weakness-then gradually worsening shortness of breath-found to have acute hypoxic respiratory failure requiring HFNC-due to COVID-19 pneumonia-as well as superimposed bacterial pneumonia and pulmonary embolism.  See below for further details.  COVID-19 vaccinated status: Vaccinated  Significant Events: 8/7-8/8>> hospitalized for CVA.  Covid negative on 8/7. 8/12-8/16>> Covid positive-hospitalized in [place] 8/21>> Admit to [facility] for shortness of breath-hypoxia from COVID-19 pneumonia, bacterial pneumonia and PE/DVT.  Significant studies: 8/22>> lower extremity Doppler: DVT in the right posterior tibial vein, left posterior tibial vein, left peroneal vein 8/21>>Chest x-ray: Multifocal pneumonia 8/21>> CTA chest: Marked severity multifocal infiltrates throughout both lungs, PE within a posterior subsegmental lower lobe branch of the right pulmonary artery 8/13>> CT abdomen[facility]pelvis (at [facility]): Circumferential 6 cm soft tissue mass of the distal transverse colon-compatible with colon carcinoma.  Superimposed mild regional mesenteric stranding and lymphadenopathy. 8/8>> Echo: EF 08-14%, grade 2 diastolic  dysfunction 4/8>> CTA head/neck: No significant stenosis in carotid artery in neck bilaterally.  COVID-19 medications: Steroids: 8/21>> Remdesivir: 8/21>>  Antibiotics: Rocephin: 8/21>> Zithromax: 8/21>>  Microbiology data: 8/21 >>blood culture: No growth  Procedures: None  Consults: None  DVT prophylaxis: Therapeutic Lovenox    Subjective:   Confused-but somewhat redirectable.  Has worsened overnight-now on NRB in addition to 15 L of HFNC.  Per nursing staff-he keeps on pulling of O2-hence mittens placed.  He appears comfortable on my exam.  Answering most of my questions appropriately but with redirection.   Assessment  & Plan :   Acute Hypoxic Resp Failure due to Covid 19 Viral  pneumonia, concern for superimposed bacterial pneumonia and small PE: Continues to have severe hypoxemia-now requiring NRB in addition to 15 L of HFNC.  Confused but comfortable.  Remains on high-dose Solu-Medrol, Remdesivir and empiric antibiotics.   Long discussion with patient's daughter-Jennifer earlier this afternoon-she is aware of patient's tenuous condition-potential for further decline leading to death.  Have urged her to get in touch with her other family members-have recommended DNR status-and transition to comfort measures if patient does not improve in the next few days.  See palliative care discussion below  Fever: afebrile O2 requirements:  SpO2: 100 % O2 Flow Rate (L/min): 15 L/min   COVID-19 Labs: Recent Labs    03/25/2020 1435 03/31/20 0604 04/01/20 0334  DDIMER >20.00* 1.14* 4.55*  FERRITIN 88 73 87  LDH 328*  --   --   CRP 32.1* 28.1* 18.8*       Component Value Date/Time   BNP 211.5 (H) 03/11/2020 1731    Recent Labs  Lab 03/11/2020 1435 04/01/20 0334  PROCALCITON 4.93 3.06    Lab Results  Component Value Date   SARSCOV2NAA POSITIVE (A) 04/08/2020   [place] NEGATIVE 03/16/2020    Prone/Incentive Spirometry: encouraged patient to lie prone for 3-4 hours  at a time for a total of 16 hours a day, and to encourage incentive spirometry use 3-4/hour.  PE with bilateral lower extremity DVT: On therapeutic Lovenox.  Likely provoked by COVID-19-and possibility of hypercoagulable state of malignancy (recent CT with possible colon mass-see below)  Acute metabolic encephalopathy: Suspect secondary to hypoxemia-with some contribution from steroids.  Although confused-somewhat redirectable.  Nonfocal exam.  Continue supportive care.  Recent CVA (left thalamic infarct): Thought to be due to small vessel disease-was discharged on aspirin/Plavix-but now on full dose anticoagulation for VTE.  Spoke with stroke MD on-call Dr. Kizzie Fantasia 8/22-chart reviewed together-recommendations are to go ahead and stop aspirin/Plavix-and to continue with full dose anticoagulation for VTE.  He has no deficits on exam-from chart review-appears that this was mostly incidental CVA-only complains of some dizziness.  AKI: Improved-likely hemodynamically mediated.  HTN: Controlled-continue amlodipine and candesartan.  GERD: Continue Carafate/PPI  Transverse colon mass seen on CT scan: See above-he has microcytic-iron deficient anemia-high suspicion for underlying colon malignancy.  Unfortunately colonoscopy will have to be deferred until he is more stable.  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  Continue iron supplementation.    Vitamin B12 deficiency: Vitamin B12 borderline low- will start supplementation.  DM-2 (A1c 8.7 on 8/7) with uncontrolled hyperglycemia due to steroids: CBGs relatively stable-continue Levemir 10 units twice daily, Fornage of NovoLog with meals and SSI.  Given his overall frailty-poor prognosis-do not think he would benefit from tight glycemic control-hence will allow some amount of permissive hyperglycemia.    Recent Labs    03/31/20 1550 04/01/20 0721 04/01/20 1150  GLUCAP  176* 199* 250*    Palliative care discussion: Long discussion with patient-daughter-Jennifer again earlier this afternoon-patient is deteriorating-with more oxygen requirement and with metabolic encephalopathy.  Given his numerous medical conditions as outlined above-he remains at risk for further worsening.  He appears to have underlying malignancy as well.  He is not a ideal candidate for aggressive care including intubation and mechanical ventilation.  Have recommended DNR status.  I have urged the patient's daughter to discuss goals of care-potential hospice care with her siblings and her mother (patient spouse)-as patient slowly continues to deteriorate.  Plan is to continue with current  care-I will call the family again in a few hours.  GI prophylaxis: PPI  ABG: No results found for: PHART, PCO2ART, PO2ART, HCO3, TCO2, ACIDBASEDEF, O2SAT  Vent Settings: N/A  Condition - Extremely Guarded  Family Communication  :  Daughter-Jennifer [phone]  updated over the phone 8/23-we will call again in a few hours to discuss further.  Code Status :  Full Code  Diet :  Diet Order            Diet heart healthy/carb modified Room service appropriate? Yes; Fluid consistency: Thin  Diet effective now                  Disposition Plan  :   Status is: Inpatient  Remains inpatient appropriate because:Inpatient level of care appropriate due to severity of illness   Dispo: The patient is from: Home              Anticipated d/c is to: Home              Anticipated d/c date is: > 3 days              Patient currently is not medically stable to d/c.  Barriers to discharge: Hypoxia requiring O2 supplementation/complete 5 days of IV Remdesivir  Antimicorbials  :    Anti-infectives (From admission, onward)   Start     Dose/Rate Route Frequency Ordered Stop   04/01/20 1500  cefTRIAXone (ROCEPHIN) 2 g in sodium chloride 0.9 % 100 mL IVPB        2 g 200 mL/hr over 30 Minutes Intravenous  Every 24 hours 04/01/20 1035 04/05/20 2359   03/31/20 1800  azithromycin (ZITHROMAX) 500 mg in sodium chloride 0.9 % 250 mL IVPB  Status:  Discontinued        500 mg 250 mL/hr over 60 Minutes Intravenous Every 24 hours 04/01/2020 1632 04/06/2020 1633   03/31/20 1500  cefTRIAXone (ROCEPHIN) 1 g in sodium chloride 0.9 % 100 mL IVPB  Status:  Discontinued        1 g 200 mL/hr over 30 Minutes Intravenous Every 24 hours 03/11/2020 1632 04/01/20 1035   03/31/20 1000  remdesivir 100 mg in sodium chloride 0.9 % 100 mL IVPB       "Followed by" Linked Group Details   100 mg 200 mL/hr over 30 Minutes Intravenous Daily 04/06/2020 1519 04/04/20 0959   04/06/2020 1530  remdesivir 200 mg in sodium chloride 0.9% 250 mL IVPB       "Followed by" Linked Group Details   200 mg 580 mL/hr over 30 Minutes Intravenous Once 04/08/2020 1519 04/06/2020 1824   03/13/2020 1345  cefTRIAXone (ROCEPHIN) 2 g in sodium chloride 0.9 % 100 mL IVPB  Status:  Discontinued        2 g 200 mL/hr over 30 Minutes Intravenous Every 24 hours 03/11/2020 1331 04/05/2020 1633   04/01/2020 1345  azithromycin (ZITHROMAX) 500 mg in sodium chloride 0.9 % 250 mL IVPB        500 mg 250 mL/hr over 60 Minutes Intravenous Every 24 hours 03/18/2020 1331 29-Apr-2020 1344      Inpatient Medications  Scheduled Meds: . albuterol  2 puff Inhalation Q6H  . amLODipine  5 mg Oral Daily  . vitamin C  500 mg Oral Daily  . enoxaparin (LOVENOX) injection  80 mg Subcutaneous Q12H  . ferrous gluconate  324 mg Oral Q breakfast  . insulin aspart  0-20 Units Subcutaneous TID WC  . insulin  aspart  0-5 Units Subcutaneous QHS  . insulin aspart  4 Units Subcutaneous TID WC  . insulin detemir  10 Units Subcutaneous BID  . methylPREDNISolone (SOLU-MEDROL) injection  70 mg Intravenous Q12H  . metoCLOPramide  5 mg Oral TID WC  . pantoprazole  40 mg Oral Daily  . simvastatin  40 mg Oral Daily  . zinc sulfate  220 mg Oral Daily   Continuous Infusions: . sodium chloride 10 mL/hr at  03/31/20 0741  . azithromycin Stopped (03/31/20 1527)  . cefTRIAXone (ROCEPHIN)  IV    . remdesivir 100 mg in NS 100 mL 100 mg (04/01/20 0943)   PRN Meds:.acetaminophen, chlorpheniramine-HYDROcodone, guaiFENesin-dextromethorphan, naphazoline-pheniramine, ondansetron **OR** ondansetron (ZOFRAN) IV, traMADol   Time Spent in minutes  35    See all Orders from today for further details   Oren Binet M.D on 04/01/2020 at 12:27 PM  To page go to [email or web address] - use universal password  [facility] -  Office  [phone]    Objective:   Vitals:   04/01/20 0922 04/01/20 1000 04/01/20 1100 04/01/20 1151  BP: (!) 160/84 131/68 134/83 139/83  Pulse: (!) 106 (!) 115 (!) 111 (!) 111  Resp: (!) 23 19 20 19   Temp: 98.1 F (36.7 C) 98.2 F (36.8 C) 98 F (36.7 C) 98 F (36.7 C)  TempSrc: Axillary Axillary Axillary Axillary  SpO2: 95% 93% (!) 89% 100%  Weight:      Height:        Wt Readings from Last 3 Encounters:  03/11/2020 77.1 kg  03/16/20 75.8 kg  02/19/20 75 kg     Intake/Output Summary (Last 24 hours) at 04/01/2020 1227 Last data filed at 04/01/2020 1121 Gross per 24 hour  Intake 1066.67 ml  Output --  Net 1066.67 ml     Physical Exam Gen Exam: Not in any distress but confused. HEENT:atraumatic, normocephalic Chest: B/L clear to auscultation anteriorly CVS:S1S2 regular Abdomen:soft non tender, non distended Extremities:no edema Neurology: Non focal Skin: no rash   Data Review:    CBC Recent Labs  Lab 04/05/2020 1435 03/26/2020 1731 03/31/20 0604 04/01/20 0334  WBC 9.8 9.2 6.5 11.7*  HGB 8.5* 8.4* 8.4* 7.8*  HCT 28.2* 27.9* 27.5* 24.5*  PLT 416* 382 367 393  MCV 78.1* 77.9* 78.1* 75.6*  MCH 23.5* 23.5* 23.9* 24.1*  MCHC 30.1 30.1 30.5 31.8  RDW 16.2* 16.2* 16.5* 16.2*  LYMPHSABS 0.3*  --  0.4* 0.4*  MONOABS 0.4  --  0.3 0.6  EOSABS 0.0  --  0.0 0.0  BASOSABS 0.0  --  0.0 0.0    Chemistries  Recent Labs  Lab 03/21/2020 1435  03/20/2020 1731 03/31/20 0604 04/01/20 0334  NA 127*  --  130* 133*  K 4.3  --  4.4 3.9  CL 93*  --  99 99  CO2 22  --  20* 24  GLUCOSE 243*  --  213* 210*  BUN 28*  --  23 30*  CREATININE 1.56* 1.41* 1.09 1.19  CALCIUM 7.9*  --  7.4* 7.8*  MG  --   --  1.8 2.3  AST 30  --  23 28  ALT 34  --  27 29  ALKPHOS 145*  --  131* 187*  BILITOT 0.7  --  0.7 0.5   ------------------------------------------------------------------------------------------------------------------ Recent Labs    03/16/2020 1435  TRIG 79    Lab Results  Component Value Date   HGBA1C 8.7 (H) 03/16/2020   ------------------------------------------------------------------------------------------------------------------ No results for  input(s): TSH, T4TOTAL, T3FREE, THYROIDAB in the last 72 hours.  Invalid input(s): FREET3 ------------------------------------------------------------------------------------------------------------------ Recent Labs    03/31/20 0604 04/01/20 0334  FERRITIN 73 87    Coagulation profile Recent Labs  Lab 04/01/2020 1435  INR 1.3*    Recent Labs    03/31/20 0604 04/01/20 0334  DDIMER 1.14* 4.55*    Cardiac Enzymes No results for input(s): CKMB, TROPONINI, MYOGLOBIN in the last 168 hours.  Invalid input(s): CK ------------------------------------------------------------------------------------------------------------------    Component Value Date/Time   BNP 211.5 (H) 03/12/2020 1731    Micro Results Recent Results (from the past 240 hour(s))  SARS Coronavirus 2 by RT PCR (hospital order, performed in [facility] hospital lab) Nasopharyngeal Nasopharyngeal Swab     Status: Abnormal   Collection Time: 03/27/2020  2:00 PM   Specimen: Nasopharyngeal Swab  Result Value Ref Range Status   SARS Coronavirus 2 POSITIVE (A) NEGATIVE Final    Comment: RESULT CALLED TO, READ BACK BY AND VERIFIED WITH: RN joe b. [id number] fcp (NOTE) SARS-CoV-2 target nucleic acids are  DETECTED  SARS-CoV-2 RNA is generally detectable in upper respiratory specimens  during the acute phase of infection.  Positive results are indicative  of the presence of the identified virus, but do not rule out bacterial infection or co-infection with other pathogens not detected by the test.  Clinical correlation with patient history and  other diagnostic information is necessary to determine patient infection status.  The expected result is negative.  Fact Sheet for Patients:   StrictlyIdeas.no   Fact Sheet for Healthcare Providers:   BankingDealers.co.za    This test is not yet approved or cleared by the [place] FDA and  has been authorized for detection and/or diagnosis of SARS-CoV-2 by FDA under an Emergency Use Authorization (EUA).  This EUA will remain in effect (meaning this test can be  used) for the duration of  the COVID-19 declaration under Section 564(b)(1) of the Act, 21 U.S.C. section 360-bbb-3(b)(1), unless the authorization is terminated or revoked sooner.  Performed at [facility], [place] [address], [place], [place] [address]   Blood Culture (routine x 2)     Status: None (Preliminary result)   Collection Time: 03/29/2020  2:32 PM   Specimen: BLOOD RIGHT WRIST  Result Value Ref Range Status   Specimen Description BLOOD RIGHT WRIST  Final   Special Requests   Final    BOTTLES DRAWN AEROBIC AND ANAEROBIC Blood Culture results may not be optimal due to an inadequate volume of blood received in culture bottles   Culture   Final    NO GROWTH 2 DAYS Performed at [facility], [place] [address] [place], [place] [address]    Report Status PENDING  Incomplete  Blood Culture (routine x 2)     Status: None (Preliminary result)   Collection Time: 03/18/2020  2:39 PM   Specimen: BLOOD  Result Value Ref Range Status   Specimen Description BLOOD RIGHT ANTECUBITAL  Final   Special Requests   Final    BOTTLES  DRAWN AEROBIC AND ANAEROBIC Blood Culture results may not be optimal due to an inadequate volume of blood received in culture bottles   Culture   Final    NO GROWTH 2 DAYS Performed at [facility], [place] [address] [place], [place] [address]    Report Status PENDING  Incomplete  Urine culture     Status: None   Collection Time: 03/23/2020  3:20 PM  Specimen: In/Out Cath Urine  Result Value Ref Range Status   Specimen Description IN/OUT CATH URINE  Final   Special Requests NONE  Final   Culture   Final    NO GROWTH Performed at [facility], [address], [place], [place] [address]    Report Status 03/31/2020 FINAL  Final    Radiology Reports CT ANGIO HEAD W OR WO CONTRAST  Result Date: 03/16/2020 CLINICAL DATA:  Stroke. Dizziness and nausea 1 month. Left thalamic infarct on MRI today. EXAM: CT ANGIOGRAPHY HEAD AND NECK TECHNIQUE: Multidetector CT imaging of the head and neck was performed using the standard protocol during bolus administration of intravenous contrast. Multiplanar CT image reconstructions and MIPs were obtained to evaluate the vascular anatomy. Carotid stenosis measurements (when applicable) are obtained utilizing NASCET criteria, using the distal internal carotid diameter as the denominator. CONTRAST:  162mL OMNIPAQUE IOHEXOL 350 MG/ML SOLN COMPARISON:  MRI head 03/16/2020 FINDINGS: CT HEAD FINDINGS Brain: Mild to moderate atrophy. Moderate white matter hypodensity diffusely and bilaterally consistent with chronic microvascular ischemia. Small acute infarct in the left thalamus on MRI is not visualized by CT of this point. No acute infarct or hemorrhage. No mass lesion identified. Vascular: Normal arterial flow voids. Skull: Negative Sinuses: Paranasal sinuses clear. Orbits: Bilateral cataract extraction. Review of the MIP images confirms the above findings CTA NECK FINDINGS Aortic arch: Standard branching. Imaged portion shows no evidence of aneurysm or dissection.  No significant stenosis of the major arch vessel origins. Right carotid system: Right carotid bifurcation widely patent. Approximately 1 cm above the bifurcation there is a calcified plaque in the right internal carotid artery narrowing the lumen by less than 25% diameter stenosis. Left carotid system: Minimal atherosclerotic calcification left carotid bifurcation without stenosis Vertebral arteries: Right vertebral artery dominant and patent to the basilar. Calcified plaque distal right vertebral artery with moderate stenosis. Moderate calcific stenosis origin of left vertebral artery mild stenosis distal left vertebral artery. Left vertebral artery is continuous to the basilar. Skeleton: Cervical spondylosis.  No acute skeletal abnormality. Other neck: Negative Upper chest: Lung apices clear bilaterally. Review of the MIP images confirms the above findings CTA HEAD FINDINGS Anterior circulation: Mild atherosclerotic calcification in the cavernous carotid bilaterally without significant stenosis. Anterior and middle cerebral arteries patent bilaterally without stenosis. Posterior circulation: Moderate calcific stenosis distal right vertebral artery the skull base. Mild stenosis distal left vertebral artery. PICA patent bilaterally. Basilar widely patent. Small AICA patent bilaterally. Superior cerebellar and posterior cerebral arteries patent bilaterally. No significant stenosis or large vessel occlusion. Venous sinuses: Normal venous enhancement Anatomic variants: None Review of the MIP images confirms the above findings IMPRESSION: 1. No acute abnormality by CT. Small acute infarct left thalamus on MRI. There is atrophy and chronic microvascular ischemic changes in the white matter. 2. Mild atherosclerotic disease in the carotid artery in the neck bilaterally without significant stenosis. 3. 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 4. No  intracranial large vessel occlusion. Electronically Signed   By: Franchot Gallo M.D.   On: 03/16/2020 20:40   DG Chest 2 View  Result Date: 03/16/2020 CLINICAL DATA:  Dizziness, nausea EXAM: CHEST - 2 VIEW COMPARISON:  None. FINDINGS: The heart size and mediastinal contours are within normal limits. Both lungs are clear. The visualized skeletal structures are unremarkable. IMPRESSION: Normal study. Electronically Signed   By: Rolm Baptise M.D.   On: 03/16/2020 19:46  CT Head Wo Contrast  Result Date: 03/16/2020 CLINICAL DATA:  Follow-up stroke, dizziness EXAM: CT HEAD WITHOUT CONTRAST TECHNIQUE: Contiguous axial images were obtained from the base of the skull through the vertex without intravenous contrast. COMPARISON:  Same day MR brain, 03/16/2020 FINDINGS: Brain: Extensive periventricular and deep white matter hypodensity. Subtle focal hypodensity of the left thalamus in keeping with acute diffusion restricting infarction seen on same day MR (series 2, image 16). No evidence of hemorrhage, hydrocephalus, extra-axial collection or mass lesion/mass effect. Vascular: No hyperdense vessel or unexpected calcification. Skull: Normal. Negative for fracture or focal lesion. Sinuses/Orbits: No acute finding. Other: None. IMPRESSION: 1. Subtle focal hypodensity of the left thalamus in keeping with acute diffusion restricting infarction seen on same day MR. No evidence of hemorrhage. 2. Advanced small-vessel white matter disease. Electronically Signed   By: Eddie Candle M.D.   On: 03/16/2020 17:02   CT ANGIO NECK W OR WO CONTRAST  Result Date: 03/16/2020 CLINICAL DATA:  Stroke. Dizziness and nausea 1 month. Left thalamic infarct on MRI today. EXAM: CT ANGIOGRAPHY HEAD AND NECK TECHNIQUE: Multidetector CT imaging of the head and neck was performed using the standard protocol during bolus administration of intravenous contrast. Multiplanar CT image reconstructions and MIPs were obtained to evaluate the vascular  anatomy. Carotid stenosis measurements (when applicable) are obtained utilizing NASCET criteria, using the distal internal carotid diameter as the denominator. CONTRAST:  12mL OMNIPAQUE IOHEXOL 350 MG/ML SOLN COMPARISON:  MRI head 03/16/2020 FINDINGS: CT HEAD FINDINGS Brain: Mild to moderate atrophy. Moderate white matter hypodensity diffusely and bilaterally consistent with chronic microvascular ischemia. Small acute infarct in the left thalamus on MRI is not visualized by CT of this point. No acute infarct or hemorrhage. No mass lesion identified. Vascular: Normal arterial flow voids. Skull: Negative Sinuses: Paranasal sinuses clear. Orbits: Bilateral cataract extraction. Review of the MIP images confirms the above findings CTA NECK FINDINGS Aortic arch: Standard branching. Imaged portion shows no evidence of aneurysm or dissection. No significant stenosis of the major arch vessel origins. Right carotid system: Right carotid bifurcation widely patent. Approximately 1 cm above the bifurcation there is a calcified plaque in the right internal carotid artery narrowing the lumen by less than 25% diameter stenosis. Left carotid system: Minimal atherosclerotic calcification left carotid bifurcation without stenosis Vertebral arteries: Right vertebral artery dominant and patent to the basilar. Calcified plaque distal right vertebral artery with moderate stenosis. Moderate calcific stenosis origin of left vertebral artery mild stenosis distal left vertebral artery. Left vertebral artery is continuous to the basilar. Skeleton: Cervical spondylosis.  No acute skeletal abnormality. Other neck: Negative Upper chest: Lung apices clear bilaterally. Review of the MIP images confirms the above findings CTA HEAD FINDINGS Anterior circulation: Mild atherosclerotic calcification in the cavernous carotid bilaterally without significant stenosis. Anterior and middle cerebral arteries patent bilaterally without stenosis. Posterior  circulation: Moderate calcific stenosis distal right vertebral artery the skull base. Mild stenosis distal left vertebral artery. PICA patent bilaterally. Basilar widely patent. Small AICA patent bilaterally. Superior cerebellar and posterior cerebral arteries patent bilaterally. No significant stenosis or large vessel occlusion. Venous sinuses: Normal venous enhancement Anatomic variants: None Review of the MIP images confirms the above findings IMPRESSION: 1. No acute abnormality by CT. Small acute infarct left thalamus on MRI. There is atrophy and chronic microvascular ischemic changes in the white matter. 2. Mild atherosclerotic disease in the carotid artery in the neck bilaterally without significant stenosis. 3. Moderate stenosis at the origin of the left vertebral  artery and moderate stenosis distal right vertebral artery. 4. No intracranial large vessel occlusion. Electronically Signed   By: Franchot Gallo M.D.   On: 03/16/2020 20:40   CT ANGIO CHEST PE W OR WO CONTRAST  Result Date: 03/12/2020 CLINICAL DATA:  Shortness of breath. EXAM: CT ANGIOGRAPHY CHEST WITH CONTRAST TECHNIQUE: Multidetector CT imaging of the chest was performed using the standard protocol during bolus administration of intravenous contrast. Multiplanar CT image reconstructions and MIPs were obtained to evaluate the vascular anatomy. CONTRAST:  76mL OMNIPAQUE IOHEXOL 350 MG/ML SOLN COMPARISON:  Abdomen pelvis CT, dated March 22, 2020. FINDINGS: Cardiovascular: There is mild to moderate severity calcification of the aortic arch and descending thoracic aorta. A small amount of intraluminal low attenuation is seen within a posterior subsegmental lower lobe branch of the right pulmonary artery (axial CT images 62 through 66, CT series number 5). Normal heart size. No pericardial effusion. Mediastinum/Nodes: No enlarged mediastinal, hilar, or axillary lymph nodes. Thyroid gland, trachea, and esophagus demonstrate no significant findings.  Lungs/Pleura: Marked severity multifocal infiltrates are seen throughout both lungs. There is no evidence of a pleural effusion or pneumothorax. Upper Abdomen: There is a small hiatal hernia. Musculoskeletal: Numerous diverticula are seen throughout the large bowel. Marked severity thickening of the distal transverse colon is seen. This area measures approximately 5.2 cm x 4.0 cm and is seen on the prior abdomen pelvis CT. Review of the MIP images confirms the above findings. IMPRESSION: 1. Marked severity multifocal infiltrates throughout both lungs. 2. Small amount of pulmonary embolism within a posterior subsegmental lower lobe branch of the right pulmonary artery. 3. Marked severity thickening of the distal transverse colon which is seen on the prior abdomen pelvis CT. While this may represent colitis, an underlying neoplastic process cannot be excluded. Correlation with colonoscopy is recommended. 4. Small hiatal hernia. 5. Colonic diverticulosis. 6. Aortic atherosclerosis. Aortic Atherosclerosis (ICD10-I70.0). Electronically Signed   By: Virgina Norfolk M.D.   On: 03/23/2020 17:20   MR CERVICAL SPINE W WO CONTRAST  Addendum Date: 03/25/2020   Addendum: There is a bilobed cystic structure in the ventral dens noted on the spinal images but not covered by the axial views.  It had previously been noted on the sagittal images on MRI of the brain dated 03/16/2020.  It has smooth margins and does not enhance after contrast.  It does not appear aggressive and could be benign cysts or the sequela of chronic degeneration. -- RAS  Result Date: 03/24/2020  [facility] [address] [place], [place] [address] [phone] NEUROIMAGING REPORT STUDY DATE: 03/20/2020 PATIENT NAME: Aviyon Hocevar DOB: Aug 17, 1940 MRN: [id number] EXAM: MRI of the cervical spine with and without contrast ORDERING CLINICIAN: Marcial Pacas MD, PhD CLINICAL HISTORY: 79 year old man with abnormal cervical spine imaging  COMPARISON FILMS: CT 06/05/2017 TECHNIQUE: MRI of the cervical spine was obtained utilizing 3 mm sagittal slices from the posterior fossa down to the T3-4 level with T1, T2 and inversion recovery views. In addition 4 mm axial slices from H8-2 down to T1-2 level were included with T2 and gradient echo views.  After the infusion contrast, additional T1-weighted images were performed. CONTRAST: 15 mL MultiHance IMAGING SITE: [facility], [place], [place], [place] FINDINGS: :  On sagittal images, the spine is imaged from above the cervicomedullary junction to T2.   Visible brain appears normal.  Paravertebral soft tissue appears normal.  The spinal cord is of normal caliber and signal.   There is degenerative  fusion of the C3-C5 vertebral bodies.  There is 3 mm of retrolisthesis of C5 upon C6. The discs and interspaces were further evaluated on axial views from C2 to T1 as follows: C2-C3: There is disc bulging, uncovertebral spurring and ligamenta flava hypertrophy.  This causes mild foraminal narrowing but no nerve root compression. C3-C4: There is degenerative fusion some uncovertebral spurring is noted to the left causing mild left foraminal narrowing but there is no nerve root compression or spinal stenosis. C4-C5: There is degenerative fusion.  There is no nerve root compression or spinal stenosis. C5-C6: There is mild spinal stenosis due to 3 mm retrolisthesis, bulging of the uncovered disc, uncovertebral spurring.  This causes mild foraminal narrowing but no nerve root compression. C6-C7: There is borderline spinal stenosis due to disc bulging, minimal uncovertebral spurring, mild right facet hypertrophy and ligamenta flava hypertrophy.  There is mild right foraminal narrowing but no nerve root compression. C7-T1: The disc and interspace appear normal. T1-T2: The disc and interspace appear normal. After the infusion of contrast, a normal enhancement pattern was observed.  No definite changes  compared to the CT from 06/05/2017.   This MRI of the cervical spine with and without contrast shows the following: 1.   The spinal cord appears normal. 2.   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 3.   At C2-C3, there are mild degenerative changes causing mild foraminal narrowing but no nerve root compression.  4.   At C5-C6, there is mild spinal stenosis due to retrolisthesis and other degenerative change.  There is mild foraminal narrowing but no nerve root compression. 5.   At C6-C7, there is borderline spinal stenosis due to degenerative changes there is mild right foraminal narrowing but no nerve root compression. 6.   Normal enhancement pattern. INTERPRETING PHYSICIAN: Richard A. Felecia Shelling, MD, PhD, FAAN Certified in  Neuroimaging by [employer]  DG Chest [place] 1 View  Result Date: 03/23/2020 CLINICAL DATA:  Hypoxia in a patient with COVID-19. EXAM: PORTABLE CHEST 1 VIEW COMPARISON:  PA and lateral chest 03/16/2020. FINDINGS: The patient has extensive multifocal airspace disease consistent with pneumonia. Heart size normal. No pneumothorax or pleural effusion. No acute or focal bony abnormality. IMPRESSION: Multifocal pneumonia. Electronically Signed   By: Inge Rise M.D.   On: 03/24/2020 14:34   MR BRAIN/IAC W WO CONTRAST  Result Date: 03/16/2020 CLINICAL DATA:  Vertigo of central origin. Dizziness; vertigo, central. Additional history provided by scanning technologist: Patient reports 3-4 months of dizziness and nausea. EXAM: MRI HEAD WITHOUT AND WITH CONTRAST TECHNIQUE: Multiplanar, multiecho pulse sequences of the brain and surrounding structures were obtained without and with intravenous contrast. CONTRAST:  65mL MULTIHANCE GADOBENATE DIMEGLUMINE 529 MG/ML IV SOLN COMPARISON:  CT cervical spine 06/05/2017. FINDINGS: Brain: Moderate generalized parenchymal atrophy. 9 mm focus of restricted diffusion consistent with acute infarction within the left thalamus.  Moderate patchy T2/FLAIR hyperintensity within the cerebral white matter is nonspecific, but consistent with chronic small vessel ischemic disease. 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 No intracranial mass is identified. Specifically, no cerebellopontine angle mass or internal auditory canal lesion is demonstrated. Unremarkable appearance of the seventh and eighth cranial nerves bilaterally. No abnormal intracranial enhancement. There is no acute infarct. No chronic intracranial blood products. No extra-axial fluid collection. No midline shift. Vascular: Expected proximal arterial flow voids. Skull and upper cervical spine: 12 mm T1 hypointense lesion within the ventral aspect of the dens (series 5, image 13) (  series 14, image 2). There is no appreciable lesion at this site on the prior CT cervical spine of 06/05/2017. Sinuses/Orbits: Bilateral lens replacements. Mild ethmoid sinus mucosal thickening. No significant mastoid effusion. 9 mm acute left thalamic infarct. These results were called by telephone at the time of interpretation on 03/16/2020 at 3:51 pm to provider Dr. Floyde Parkins, who verbally acknowledged these results. Indeterminate 12 mm lesion within the dens. These results will be called to the ordering clinician or representative by the Radiologist Assistant, and communication documented in the PACS or [employer]. IMPRESSION: 9 mm acute left thalamic infarct. No cerebellopontine angle mass or internal auditory canal lesion is demonstrated. Unremarkable appearance of the seventh and eighth cranial nerves bilaterally. Moderate generalized parenchymal atrophy and chronic small vessel ischemic disease. 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 Indeterminate 12 mm lesion within the ventral aspect of the dens. Contrast-enhanced cervical spine MRI is recommended for further characterization, as clinically warranted. Mild  ethmoid sinus mucosal thickening. Electronically Signed   By: Kellie Simmering DO   On: 03/16/2020 15:59   ECHOCARDIOGRAM COMPLETE  Result Date: 03/17/2020    ECHOCARDIOGRAM REPORT   Patient Name:   MYLIN Eisner Date of Exam: 03/17/2020 Medical Rec #:  [id number]   Height:       68.0 in Accession #:    [id number]  Weight:       167.0 lb Date of Birth:  1941-07-11   BSA:          1.893 m Patient Age:    55 years    BP:           170/76 mmHg Patient Gender: M           HR:           94 bpm. Exam Location:  Inpatient Procedure: 2D Echo and Intracardiac Opacification Agent Indications:    Stroke I163.9  History:        Patient has no prior history of Echocardiogram examinations.                 Risk Factors:Diabetes.  Sonographer:    Mikki Santee RDCS (AE) Referring Phys: [place]  1. Left ventricular ejection fraction, by estimation, is 65 to 70%. The left ventricle has normal function. The left ventricle has no regional wall motion abnormalities. Left ventricular diastolic parameters are consistent with Grade II diastolic dysfunction (pseudonormalization).  2. Right ventricular systolic function is normal. The right ventricular size is normal.  3. The mitral valve is normal in structure. No evidence of mitral valve regurgitation. No evidence of mitral stenosis.  4. The aortic valve is grossly normal. Aortic valve regurgitation is trivial. No aortic stenosis is present. FINDINGS  Left Ventricle: Left ventricular ejection fraction, by estimation, is 65 to 70%. The left ventricle has normal function. The left ventricle has no regional wall motion abnormalities. Definity contrast agent was given IV to delineate the left ventricular  endocardial borders. The left ventricular internal cavity size was normal in size. There is no left ventricular hypertrophy. Left ventricular diastolic parameters are consistent with Grade II diastolic dysfunction (pseudonormalization). Right Ventricle: The right  ventricular size is normal. No increase in right ventricular wall thickness. Right ventricular systolic function is normal. Left Atrium: Left atrial size was normal in size. Right Atrium: Right atrial size was normal in size. Pericardium: There is no evidence of pericardial effusion. Mitral Valve: The mitral valve is normal in structure.  No evidence of mitral valve regurgitation. No evidence of mitral valve stenosis. Tricuspid Valve: The tricuspid valve is normal in structure. Tricuspid valve regurgitation is not demonstrated. Aortic Valve: The aortic valve is grossly normal. Aortic valve regurgitation is trivial. No aortic stenosis is present. Pulmonic Valve: The pulmonic valve was normal in structure. Pulmonic valve regurgitation is not visualized. Aorta: The aortic root and ascending aorta are structurally normal, with no evidence of dilitation. IAS/Shunts: The atrial septum is grossly normal.  LEFT VENTRICLE PLAX 2D LVIDd:         3.10 cm  Diastology LVIDs:         2.20 cm  LV e' lateral:   9.46 cm/s LV PW:         1.10 cm  LV E/e' lateral: 10.2 LV IVS:        0.90 cm  LV e' medial:    5.55 cm/s LVOT diam:     2.00 cm  LV E/e' medial:  17.4 LV SV:         58 LV SV Index:   31 LVOT Area:     3.14 cm  RIGHT VENTRICLE RV S prime:     18.50 cm/s TAPSE (M-mode): 3.0 cm LEFT ATRIUM             Index       RIGHT ATRIUM           Index LA diam:        2.80 cm 1.48 cm/m  RA Area:     14.40 cm LA Vol (A2C):   44.3 ml 23.40 ml/m RA Volume:   31.50 ml  16.64 ml/m LA Vol (A4C):   56.5 ml 29.85 ml/m LA Biplane Vol: 55.4 ml 29.27 ml/m  AORTIC VALVE LVOT Vmax:   113.00 cm/s LVOT Vmean:  70.600 cm/s LVOT VTI:    0.186 m  AORTA Ao Root diam: 3.00 cm MITRAL VALVE MV Area (PHT): 3.89 cm    SHUNTS MV Decel Time: 195 msec    Systemic VTI:  0.19 m MV E velocity: 96.30 cm/s  Systemic Diam: 2.00 cm MV A velocity: 83.10 cm/s MV E/A ratio:  1.16 Mertie Moores MD Electronically signed by Mertie Moores MD Signature Date/Time:  03/17/2020/4:31:32 PM    Final    VAS [place] LOWER EXTREMITY VENOUS (DVT)  Result Date: 03/31/2020  Lower Venous DVT Study Indications: Covid-19, and pulmonary embolism.  Comparison Study: No prior study on file Performing Technologist: Sharion Dove RVS  Examination Guidelines: A complete evaluation includes B-mode imaging, spectral Doppler, color Doppler, and power Doppler as needed of all accessible portions of each vessel. Bilateral testing is considered an integral part of a complete examination. Limited examinations for reoccurring indications may be performed as noted. The reflux portion of the exam is performed with the patient in reverse Trendelenburg.  +---------+---------------+---------+-----------+----------+--------------+ RIGHT    CompressibilityPhasicitySpontaneityPropertiesThrombus Aging +---------+---------------+---------+-----------+----------+--------------+ CFV      Full           Yes      Yes                                 +---------+---------------+---------+-----------+----------+--------------+ SFJ      Full                                                        +---------+---------------+---------+-----------+----------+--------------+  FV Prox  Full                                                        +---------+---------------+---------+-----------+----------+--------------+ FV Mid   Full                                                        +---------+---------------+---------+-----------+----------+--------------+ FV DistalFull                                                        +---------+---------------+---------+-----------+----------+--------------+ PFV      Full                                                        +---------+---------------+---------+-----------+----------+--------------+ POP      Full           Yes      Yes                                  +---------+---------------+---------+-----------+----------+--------------+ PTV      None                                         Acute          +---------+---------------+---------+-----------+----------+--------------+ PERO     Full                                                        +---------+---------------+---------+-----------+----------+--------------+   +---------+---------------+---------+-----------+----------+--------------+ LEFT     CompressibilityPhasicitySpontaneityPropertiesThrombus Aging +---------+---------------+---------+-----------+----------+--------------+ CFV      Full           Yes      Yes                                 +---------+---------------+---------+-----------+----------+--------------+ SFJ      Full                                                        +---------+---------------+---------+-----------+----------+--------------+ FV Prox  Full                                                        +---------+---------------+---------+-----------+----------+--------------+  FV Mid   Full                                                        +---------+---------------+---------+-----------+----------+--------------+ FV DistalFull                                                        +---------+---------------+---------+-----------+----------+--------------+ PFV      Full                                                        +---------+---------------+---------+-----------+----------+--------------+ POP      Full           Yes      Yes                                 +---------+---------------+---------+-----------+----------+--------------+ PTV      None                                         Acute          +---------+---------------+---------+-----------+----------+--------------+ PERO     None                                         Acute           +---------+---------------+---------+-----------+----------+--------------+     Summary: RIGHT: - Findings consistent with acute deep vein thrombosis involving the right posterior tibial veins.  LEFT: - Findings consistent with acute deep vein thrombosis involving the left posterior tibial veins, and left peroneal veins.  *See table(s) above for measurements and observations. Electronically signed by Deitra Mayo MD on 03/31/2020 at 3:40:25 PM.    Final

## 2020-04-01 NOTE — Progress Notes (Signed)
Inpatient Diabetes Program Recommendations  AACE/ADA: New Consensus Statement on Inpatient Glycemic Control (2015)  Target Ranges:  Prepandial:   less than 140 mg/dL      Peak postprandial:   less than 180 mg/dL (1-2 hours)      Critically ill patients:  140 - 180 mg/dL   Lab Results  Component Value Date   GLUCAP 199 (H) 04/01/2020   HGBA1C 8.7 (H) 03/16/2020    Review of Glycemic Control  Diabetes history: DM 2 Outpatient Diabetes medications: Glipizide 10 mg bid, Novolog 1-9 units tid Current orders for Inpatient glycemic control:  Levemir 10 units bid Novolog 0-20 units tid + hs Novolog 4 units tid meal coverage  Solumedrol 70 mg Q12 hours  Inpatient Diabetes Program Recommendations:    -  Add Tradjenta 5 mg Daily (part of COVID glycemic control order set shown to reduce mortality)  Thanks,  Tama Headings RN, MSN, BC-ADM Inpatient Diabetes Coordinator Team Pager [phone] (8a-5p)

## 2020-04-01 NOTE — Progress Notes (Signed)
   04/01/20 0724  Assess: MEWS Score  Temp 97.7 F (36.5 C)  BP (!) 154/85  Pulse Rate (!) 117  ECG Heart Rate (!) 115  Resp (!) 28  SpO2 (!) 82 %  O2 Device Non-rebreather Mask  Patient Activity (if Appropriate) In bed  O2 Flow Rate (L/min) 15 L/min  Assess: MEWS Score  MEWS Temp 0  MEWS Systolic 0  MEWS Pulse 2  MEWS RR 2  MEWS LOC 0  MEWS Score 4  MEWS Score Color Red  Assess: if the MEWS score is Yellow or Red  Were vital signs taken at a resting state? Yes  Focused Assessment Change from prior assessment (see assessment flowsheet)  Early Detection of Sepsis Score *See Row Information* High  MEWS guidelines implemented *See Row Information* Yes  Treat  MEWS Interventions Escalated (See documentation below)  Pain Scale 0-10  Pain Score Asleep  Take Vital Signs  Increase Vital Sign Frequency  Red: Q 1hr X 4 then Q 4hr X 4, if remains red, continue Q 4hrs  Escalate  MEWS: Escalate Red: discuss with charge nurse/RN and provider, consider discussing with RRT  Notify: Charge Nurse/RN  Name of Charge Nurse/RN Notified [place]  Date Charge Nurse/RN Notified 04/01/20  Time Charge Nurse/RN Notified 0750  Notify: Provider  Provider Name/Title Nena Alexander  Date Provider Notified 04/01/20  Time Provider Notified [phone]  Notification Type Page  Notification Reason Change in status  Document  Patient Outcome Other (Comment) (repeated VS changed to yellow)

## 2020-04-01 NOTE — Plan of Care (Signed)
  Problem: Education: Goal: Knowledge of risk factors and measures for prevention of condition will improve Outcome: Progressing   Problem: Coping: Goal: Psychosocial and spiritual needs will be supported Outcome: Progressing   Problem: Respiratory: Goal: Will maintain a patent airway Outcome: Progressing Goal: Complications related to the disease process, condition or treatment will be avoided or minimized Outcome: Progressing   

## 2020-04-01 NOTE — TOC Benefit Eligibility Note (Signed)
Transition of Care [facility]) Benefit Eligibility Note    Patient Details  Name: Thomas Adkins MRN: [id number] Date of Birth: 1940/10/03   Medication/Dose: Eliquis  106m bid 30day supply  Covered?: Yes  Tier: 3 Drug  Prescription Coverage Preferred Pharmacy[facility]  Spoke with Person/Company/Phone Number:: Claudene A. W/[employer] Help Desk PH# [phone] Co-Pay: $47.00  Prior Approval: No  Deductible: Met       HShelda AltesPhone Number: 04/01/2020, 2:23 PM

## 2020-04-02 ENCOUNTER — Encounter (HOSPITAL_COMMUNITY): Payer: Medicare HMO

## 2020-04-02 DIAGNOSIS — A419 Sepsis, unspecified organism: Secondary | ICD-10-CM

## 2020-04-02 DIAGNOSIS — J9601 Acute respiratory failure with hypoxia: Secondary | ICD-10-CM

## 2020-04-02 DIAGNOSIS — J189 Pneumonia, unspecified organism: Secondary | ICD-10-CM

## 2020-04-02 DIAGNOSIS — R6520 Severe sepsis without septic shock: Secondary | ICD-10-CM

## 2020-04-02 DIAGNOSIS — I2699 Other pulmonary embolism without acute cor pulmonale: Secondary | ICD-10-CM

## 2020-04-02 DIAGNOSIS — I2609 Other pulmonary embolism with acute cor pulmonale: Secondary | ICD-10-CM

## 2020-04-04 ENCOUNTER — Encounter (HOSPITAL_COMMUNITY): Payer: Medicare HMO | Admitting: Physical Therapy

## 2020-04-04 ENCOUNTER — Ambulatory Visit: Payer: Medicare HMO | Admitting: Cardiology

## 2020-04-04 LAB — CULTURE, BLOOD (ROUTINE X 2)
Culture: NO GROWTH
Culture: NO GROWTH

## 2020-04-10 NOTE — Death Summary Note (Signed)
Death Summary  Thomas Adkins EHU:[id number] DOB: 1940/12/06 DOA: 04/18/20  PCP: Glenda Chroman, MD  Admit date: 04-18-2020 Date of Death: 04/21/2020 Time of Death: 06:25 AM  Notification: Glenda Chroman, MD notified of death of 2020/04/21   History of present illness:  Thomas Adkins is a 79 y.o. male with a history of hypertension, hyperlipidemia, type 2 diabetes mellitus, GERD, and recent CVA, diagnosed with COVID-19 on 03/21/2020, hospitalized for a few days in the ED in [place], and was discharged back home on 03/25/2020 before presenting to the [facility] emergency department on 2020/04/18 with shortness of breath and hypoxia.  Patient was admitted on 04/18/2020 with acute hypoxic respiratory failure secondary to COVID-19 pneumonia and suspicion for secondary bacterial pneumonia.   Patient was started on steroids and remdesivir for the viral infection, had leukocytosis and elevated procalcitonin suggestive of a secondary bacterial infection and he was also treated with Rocephin and azithromycin.  He had a CTA chest performed on the day of admission that was positive for pulmonary embolism and he was started on anticoagulation with Lovenox.  The patient also had acute kidney injury present on admission.  Patient's course was complicated by severe hypoxemia, quiring 15 L/min via high flow nasal cannula.  He was not treated with Actemra or baricitinib due to suspected bacterial infection.  Palliative care discussions were started on the day after admission, patient's family asked that he be made DNR on 04/01/2020, understood that his prognosis was very poor, and also asked that we start shift our focus to maintaining patient's comfort, understanding that he would likely expire during this hospitalization.  Family was hoping that the patient could go home with hospice and social work was engaged to help facilitate this.  In the meantime, patient was kept comfortable and passed peacefully at 6:25 AM on  04-21-2020.   Final Diagnoses:  1.   Severe sepsis secondary to pneumonia, POA  2.   COVID-19  3.   Pulmonary embolism  4.   Acute hypoxic respiratory failure  5.   AKI    The results of significant diagnostics from this hospitalization (including imaging, microbiology, ancillary and laboratory) are listed below for reference.    Significant Diagnostic Studies: CT ANGIO HEAD W OR WO CONTRAST  Result Date: 03/16/2020 CLINICAL DATA:  Stroke. Dizziness and nausea 1 month. Left thalamic infarct on MRI today. EXAM: CT ANGIOGRAPHY HEAD AND NECK TECHNIQUE: Multidetector CT imaging of the head and neck was performed using the standard protocol during bolus administration of intravenous contrast. Multiplanar CT image reconstructions and MIPs were obtained to evaluate the vascular anatomy. Carotid stenosis measurements (when applicable) are obtained utilizing NASCET criteria, using the distal internal carotid diameter as the denominator. CONTRAST:  154mL OMNIPAQUE IOHEXOL 350 MG/ML SOLN COMPARISON:  MRI head 03/16/2020 FINDINGS: CT HEAD FINDINGS Brain: Mild to moderate atrophy. Moderate white matter hypodensity diffusely and bilaterally consistent with chronic microvascular ischemia. Small acute infarct in the left thalamus on MRI is not visualized by CT of this point. No acute infarct or hemorrhage. No mass lesion identified. Vascular: Normal arterial flow voids. Skull: Negative Sinuses: Paranasal sinuses clear. Orbits: Bilateral cataract extraction. Review of the MIP images confirms the above findings CTA NECK FINDINGS Aortic arch: Standard branching. Imaged portion shows no evidence of aneurysm or dissection. No significant stenosis of the major arch vessel origins. Right carotid system: Right carotid bifurcation widely patent. Approximately 1 cm above the bifurcation there is a calcified plaque in the right internal carotid  artery narrowing the lumen by less than 25% diameter stenosis. Left carotid system:  Minimal atherosclerotic calcification left carotid bifurcation without stenosis Vertebral arteries: Right vertebral artery dominant and patent to the basilar. Calcified plaque distal right vertebral artery with moderate stenosis. Moderate calcific stenosis origin of left vertebral artery mild stenosis distal left vertebral artery. Left vertebral artery is continuous to the basilar. Skeleton: Cervical spondylosis.  No acute skeletal abnormality. Other neck: Negative Upper chest: Lung apices clear bilaterally. Review of the MIP images confirms the above findings CTA HEAD FINDINGS Anterior circulation: Mild atherosclerotic calcification in the cavernous carotid bilaterally without significant stenosis. Anterior and middle cerebral arteries patent bilaterally without stenosis. Posterior circulation: Moderate calcific stenosis distal right vertebral artery the skull base. Mild stenosis distal left vertebral artery. PICA patent bilaterally. Basilar widely patent. Small AICA patent bilaterally. Superior cerebellar and posterior cerebral arteries patent bilaterally. No significant stenosis or large vessel occlusion. Venous sinuses: Normal venous enhancement Anatomic variants: None Review of the MIP images confirms the above findings IMPRESSION: 1. No acute abnormality by CT. Small acute infarct left thalamus on MRI. There is atrophy and chronic microvascular ischemic changes in the white matter. 2. Mild atherosclerotic disease in the carotid artery in the neck bilaterally without significant stenosis. 3. 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 4. No intracranial large vessel occlusion. Electronically Signed   By: Franchot Gallo M.D.   On: 03/16/2020 20:40   DG Chest 2 View  Result Date: 03/16/2020 CLINICAL DATA:  Dizziness, nausea EXAM: CHEST - 2 VIEW COMPARISON:  None. FINDINGS: The heart size and mediastinal contours are within normal limits.  Both lungs are clear. The visualized skeletal structures are unremarkable. IMPRESSION: Normal study. Electronically Signed   By: Rolm Baptise M.D.   On: 03/16/2020 19:46   CT Head Wo Contrast  Result Date: 03/16/2020 CLINICAL DATA:  Follow-up stroke, dizziness EXAM: CT HEAD WITHOUT CONTRAST TECHNIQUE: Contiguous axial images were obtained from the base of the skull through the vertex without intravenous contrast. COMPARISON:  Same day MR brain, 03/16/2020 FINDINGS: Brain: Extensive periventricular and deep white matter hypodensity. Subtle focal hypodensity of the left thalamus in keeping with acute diffusion restricting infarction seen on same day MR (series 2, image 16). No evidence of hemorrhage, hydrocephalus, extra-axial collection or mass lesion/mass effect. Vascular: No hyperdense vessel or unexpected calcification. Skull: Normal. Negative for fracture or focal lesion. Sinuses/Orbits: No acute finding. Other: None. IMPRESSION: 1. Subtle focal hypodensity of the left thalamus in keeping with acute diffusion restricting infarction seen on same day MR. No evidence of hemorrhage. 2. Advanced small-vessel white matter disease. Electronically Signed   By: Eddie Candle M.D.   On: 03/16/2020 17:02   CT ANGIO NECK W OR WO CONTRAST  Result Date: 03/16/2020 CLINICAL DATA:  Stroke. Dizziness and nausea 1 month. Left thalamic infarct on MRI today. EXAM: CT ANGIOGRAPHY HEAD AND NECK TECHNIQUE: Multidetector CT imaging of the head and neck was performed using the standard protocol during bolus administration of intravenous contrast. Multiplanar CT image reconstructions and MIPs were obtained to evaluate the vascular anatomy. Carotid stenosis measurements (when applicable) are obtained utilizing NASCET criteria, using the distal internal carotid diameter as the denominator. CONTRAST:  149mL OMNIPAQUE IOHEXOL 350 MG/ML SOLN COMPARISON:  MRI head 03/16/2020 FINDINGS: CT HEAD FINDINGS Brain: Mild to moderate atrophy.  Moderate white matter hypodensity diffusely and bilaterally consistent with chronic microvascular ischemia. Small acute infarct in the left thalamus  on MRI is not visualized by CT of this point. No acute infarct or hemorrhage. No mass lesion identified. Vascular: Normal arterial flow voids. Skull: Negative Sinuses: Paranasal sinuses clear. Orbits: Bilateral cataract extraction. Review of the MIP images confirms the above findings CTA NECK FINDINGS Aortic arch: Standard branching. Imaged portion shows no evidence of aneurysm or dissection. No significant stenosis of the major arch vessel origins. Right carotid system: Right carotid bifurcation widely patent. Approximately 1 cm above the bifurcation there is a calcified plaque in the right internal carotid artery narrowing the lumen by less than 25% diameter stenosis. Left carotid system: Minimal atherosclerotic calcification left carotid bifurcation without stenosis Vertebral arteries: Right vertebral artery dominant and patent to the basilar. Calcified plaque distal right vertebral artery with moderate stenosis. Moderate calcific stenosis origin of left vertebral artery mild stenosis distal left vertebral artery. Left vertebral artery is continuous to the basilar. Skeleton: Cervical spondylosis.  No acute skeletal abnormality. Other neck: Negative Upper chest: Lung apices clear bilaterally. Review of the MIP images confirms the above findings CTA HEAD FINDINGS Anterior circulation: Mild atherosclerotic calcification in the cavernous carotid bilaterally without significant stenosis. Anterior and middle cerebral arteries patent bilaterally without stenosis. Posterior circulation: Moderate calcific stenosis distal right vertebral artery the skull base. Mild stenosis distal left vertebral artery. PICA patent bilaterally. Basilar widely patent. Small AICA patent bilaterally. Superior cerebellar and posterior cerebral arteries patent bilaterally. No significant stenosis  or large vessel occlusion. Venous sinuses: Normal venous enhancement Anatomic variants: None Review of the MIP images confirms the above findings IMPRESSION: 1. No acute abnormality by CT. Small acute infarct left thalamus on MRI. There is atrophy and chronic microvascular ischemic changes in the white matter. 2. Mild atherosclerotic disease in the carotid artery in the neck bilaterally without significant stenosis. 3. 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 4. No intracranial large vessel occlusion. Electronically Signed   By: Franchot Gallo M.D.   On: 03/16/2020 20:40   CT ANGIO CHEST PE W OR WO CONTRAST  Result Date: 03/14/2020 CLINICAL DATA:  Shortness of breath. EXAM: CT ANGIOGRAPHY CHEST WITH CONTRAST TECHNIQUE: Multidetector CT imaging of the chest was performed using the standard protocol during bolus administration of intravenous contrast. Multiplanar CT image reconstructions and MIPs were obtained to evaluate the vascular anatomy. CONTRAST:  73mL OMNIPAQUE IOHEXOL 350 MG/ML SOLN COMPARISON:  Abdomen pelvis CT, dated March 22, 2020. FINDINGS: Cardiovascular: There is mild to moderate severity calcification of the aortic arch and descending thoracic aorta. A small amount of intraluminal low attenuation is seen within a posterior subsegmental lower lobe branch of the right pulmonary artery (axial CT images 62 through 66, CT series number 5). Normal heart size. No pericardial effusion. Mediastinum/Nodes: No enlarged mediastinal, hilar, or axillary lymph nodes. Thyroid gland, trachea, and esophagus demonstrate no significant findings. Lungs/Pleura: Marked severity multifocal infiltrates are seen throughout both lungs. There is no evidence of a pleural effusion or pneumothorax. Upper Abdomen: There is a small hiatal hernia. Musculoskeletal: Numerous diverticula are seen throughout the large bowel. Marked severity thickening of the  distal transverse colon is seen. This area measures approximately 5.2 cm x 4.0 cm and is seen on the prior abdomen pelvis CT. Review of the MIP images confirms the above findings. IMPRESSION: 1. Marked severity multifocal infiltrates throughout both lungs. 2. Small amount of pulmonary embolism within a posterior subsegmental lower lobe branch of the right pulmonary artery. 3. Marked severity thickening of  the distal transverse colon which is seen on the prior abdomen pelvis CT. While this may represent colitis, an underlying neoplastic process cannot be excluded. Correlation with colonoscopy is recommended. 4. Small hiatal hernia. 5. Colonic diverticulosis. 6. Aortic atherosclerosis. Aortic Atherosclerosis (ICD10-I70.0). Electronically Signed   By: Virgina Norfolk M.D.   On: 03/12/2020 17:20   MR CERVICAL SPINE W WO CONTRAST  Addendum Date: 03/25/2020   Addendum: There is a bilobed cystic structure in the ventral dens noted on the spinal images but not covered by the axial views.  It had previously been noted on the sagittal images on MRI of the brain dated 03/16/2020.  It has smooth margins and does not enhance after contrast.  It does not appear aggressive and could be benign cysts or the sequela of chronic degeneration. -- RAS  Result Date: 03/24/2020  [facility] [address], [place], [place] [address] [phone] NEUROIMAGING REPORT STUDY DATE: 03/20/2020 PATIENT NAME: Thomas Adkins DOB: 1940-09-24 MRN: [id number] EXAM: MRI of the cervical spine with and without contrast ORDERING CLINICIAN: Marcial Pacas MD, PhD CLINICAL HISTORY: 79 year old man with abnormal cervical spine imaging COMPARISON FILMS: CT 06/05/2017 TECHNIQUE: MRI of the cervical spine was obtained utilizing 3 mm sagittal slices from the posterior fossa down to the T3-4 level with T1, T2 and inversion recovery views. In addition 4 mm axial slices from V7-7 down to T1-2 level were included with T2 and gradient echo views.   After the infusion contrast, additional T1-weighted images were performed. CONTRAST: 15 mL MultiHance IMAGING SITE: [facility], [place], [place], [place] FINDINGS: :  On sagittal images, the spine is imaged from above the cervicomedullary junction to T2.   Visible brain appears normal.  Paravertebral soft tissue appears normal.  The spinal cord is of normal caliber and signal.   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  There is 3 mm of retrolisthesis of C5 upon C6. The discs and interspaces were further evaluated on axial views from C2 to T1 as follows: C2-C3: There is disc bulging, uncovertebral spurring and ligamenta flava hypertrophy.  This causes mild foraminal narrowing but no nerve root compression. C3-C4: There is degenerative fusion some uncovertebral spurring is noted to the left causing mild left foraminal narrowing but there is no nerve root compression or spinal stenosis. C4-C5: There is degenerative fusion.  There is no nerve root compression or spinal stenosis. C5-C6: There is mild spinal stenosis due to 3 mm retrolisthesis, bulging of the uncovered disc, uncovertebral spurring.  This causes mild foraminal narrowing but no nerve root compression. C6-C7: There is borderline spinal stenosis due to disc bulging, minimal uncovertebral spurring, mild right facet hypertrophy and ligamenta flava hypertrophy.  There is mild right foraminal narrowing but no nerve root compression. C7-T1: The disc and interspace appear normal. T1-T2: The disc and interspace appear normal. After the infusion of contrast, a normal enhancement pattern was observed.  No definite changes compared to the CT from 06/05/2017.   This MRI of the cervical spine with and without contrast shows the following: 1.   The spinal cord appears normal. 2.   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 3.   At C2-C3, there are mild degenerative changes causing mild foraminal narrowing but no nerve root  compression.  4.   At C5-C6, there is mild spinal stenosis due to retrolisthesis and other degenerative change.  There is mild foraminal narrowing but no nerve root compression. 5.   At C6-C7,  there is borderline spinal stenosis due to degenerative changes there is mild right foraminal narrowing but no nerve root compression. 6.   Normal enhancement pattern. INTERPRETING PHYSICIAN: Richard A. Felecia Shelling, MD, PhD, FAAN Certified in  Neuroimaging by  [employer]  DG Chest [place] 1 View  Result Date: 04/06/2020 CLINICAL DATA:  Hypoxia in a patient with COVID-19. EXAM: PORTABLE CHEST 1 VIEW COMPARISON:  PA and lateral chest 03/16/2020. FINDINGS: The patient has extensive multifocal airspace disease consistent with pneumonia. Heart size normal. No pneumothorax or pleural effusion. No acute or focal bony abnormality. IMPRESSION: Multifocal pneumonia. Electronically Signed   By: Inge Rise M.D.   On: 04/04/2020 14:34   MR BRAIN/IAC W WO CONTRAST  Result Date: 03/16/2020 CLINICAL DATA:  Vertigo of central origin. Dizziness; vertigo, central. Additional history provided by scanning technologist: Patient reports 3-4 months of dizziness and nausea. EXAM: MRI HEAD WITHOUT AND WITH CONTRAST TECHNIQUE: Multiplanar, multiecho pulse sequences of the brain and surrounding structures were obtained without and with intravenous contrast. CONTRAST:  32mL MULTIHANCE GADOBENATE DIMEGLUMINE 529 MG/ML IV SOLN COMPARISON:  CT cervical spine 06/05/2017. FINDINGS: Brain: Moderate generalized parenchymal atrophy. 9 mm focus of restricted diffusion consistent with acute infarction within the left thalamus. Moderate patchy T2/FLAIR hyperintensity within the cerebral white matter is nonspecific, but consistent with chronic small vessel ischemic disease. 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 No intracranial mass is identified. Specifically, no cerebellopontine angle mass or internal  auditory canal lesion is demonstrated. Unremarkable appearance of the seventh and eighth cranial nerves bilaterally. No abnormal intracranial enhancement. There is no acute infarct. No chronic intracranial blood products. No extra-axial fluid collection. No midline shift. Vascular: Expected proximal arterial flow voids. Skull and upper cervical spine: 12 mm T1 hypointense lesion within the ventral aspect of the dens (series 5, image 13) (series 14, image 2). There is no appreciable lesion at this site on the prior CT cervical spine of 06/05/2017. Sinuses/Orbits: Bilateral lens replacements. Mild ethmoid sinus mucosal thickening. No significant mastoid effusion. 9 mm acute left thalamic infarct. These results were called by telephone at the time of interpretation on 03/16/2020 at 3:51 pm to provider Dr. Floyde Parkins, who verbally acknowledged these results. Indeterminate 12 mm lesion within the dens. These results will be called to the ordering clinician or representative by the Radiologist Assistant, and communication documented in the PACS or [employer]. IMPRESSION: 9 mm acute left thalamic infarct. No cerebellopontine angle mass or internal auditory canal lesion is demonstrated. Unremarkable appearance of the seventh and eighth cranial nerves bilaterally. Moderate generalized parenchymal atrophy and chronic small vessel ischemic disease. 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 Indeterminate 12 mm lesion within the ventral aspect of the dens. Contrast-enhanced cervical spine MRI is recommended for further characterization, as clinically warranted. Mild ethmoid sinus mucosal thickening. Electronically Signed   By: Kellie Simmering DO   On: 03/16/2020 15:59   ECHOCARDIOGRAM COMPLETE  Result Date: 03/17/2020    ECHOCARDIOGRAM REPORT   Patient Name:   KHAIDEN Bryant Date of Exam: 03/17/2020 Medical Rec #:  [id number]   Height:       68.0 in Accession #:    [id number]  Weight:        167.0 lb Date of Birth:  [birth year]   BSA:          1.893 m Patient Age:    71 years    BP:  170/76 mmHg Patient Gender: M           HR:           94 bpm. Exam Location:  Inpatient Procedure: 2D Echo and Intracardiac Opacification Agent Indications:    Stroke I163.9  History:        Patient has no prior history of Echocardiogram examinations.                 Risk Factors:Diabetes.  Sonographer:    Mikki Santee RDCS (AE) Referring Phys: [place]  1. Left ventricular ejection fraction, by estimation, is 65 to 70%. The left ventricle has normal function. The left ventricle has no regional wall motion abnormalities. Left ventricular diastolic parameters are consistent with Grade II diastolic dysfunction (pseudonormalization).  2. Right ventricular systolic function is normal. The right ventricular size is normal.  3. The mitral valve is normal in structure. No evidence of mitral valve regurgitation. No evidence of mitral stenosis.  4. The aortic valve is grossly normal. Aortic valve regurgitation is trivial. No aortic stenosis is present. FINDINGS  Left Ventricle: Left ventricular ejection fraction, by estimation, is 65 to 70%. The left ventricle has normal function. The left ventricle has no regional wall motion abnormalities. Definity contrast agent was given IV to delineate the left ventricular  endocardial borders. The left ventricular internal cavity size was normal in size. There is no left ventricular hypertrophy. Left ventricular diastolic parameters are consistent with Grade II diastolic dysfunction (pseudonormalization). Right Ventricle: The right ventricular size is normal. No increase in right ventricular wall thickness. Right ventricular systolic function is normal. Left Atrium: Left atrial size was normal in size. Right Atrium: Right atrial size was normal in size. Pericardium: There is no evidence of pericardial effusion. Mitral Valve: The mitral valve is normal in  structure. No evidence of mitral valve regurgitation. No evidence of mitral valve stenosis. Tricuspid Valve: The tricuspid valve is normal in structure. Tricuspid valve regurgitation is not demonstrated. Aortic Valve: The aortic valve is grossly normal. Aortic valve regurgitation is trivial. No aortic stenosis is present. Pulmonic Valve: The pulmonic valve was normal in structure. Pulmonic valve regurgitation is not visualized. Aorta: The aortic root and ascending aorta are structurally normal, with no evidence of dilitation. IAS/Shunts: The atrial septum is grossly normal.  LEFT VENTRICLE PLAX 2D LVIDd:         3.10 cm  Diastology LVIDs:         2.20 cm  LV e' lateral:   9.46 cm/s LV PW:         1.10 cm  LV E/e' lateral: 10.2 LV IVS:        0.90 cm  LV e' medial:    5.55 cm/s LVOT diam:     2.00 cm  LV E/e' medial:  17.4 LV SV:         58 LV SV Index:   31 LVOT Area:     3.14 cm  RIGHT VENTRICLE RV S prime:     18.50 cm/s TAPSE (M-mode): 3.0 cm LEFT ATRIUM             Index       RIGHT ATRIUM           Index LA diam:        2.80 cm 1.48 cm/m  RA Area:     14.40 cm LA Vol (A2C):   44.3 ml 23.40 ml/m RA Volume:   31.50 ml  16.64 ml/m  LA Vol (A4C):   56.5 ml 29.85 ml/m LA Biplane Vol: 55.4 ml 29.27 ml/m  AORTIC VALVE LVOT Vmax:   113.00 cm/s LVOT Vmean:  70.600 cm/s LVOT VTI:    0.186 m  AORTA Ao Root diam: 3.00 cm MITRAL VALVE MV Area (PHT): 3.89 cm    SHUNTS MV Decel Time: 195 msec    Systemic VTI:  0.19 m MV E velocity: 96.30 cm/s  Systemic Diam: 2.00 cm MV A velocity: 83.10 cm/s MV E/A ratio:  1.16 Mertie Moores MD Electronically signed by Mertie Moores MD Signature Date/Time: 03/17/2020/4:31:32 PM    Final    VAS [place] LOWER EXTREMITY VENOUS (DVT)  Result Date: 03/31/2020  Lower Venous DVT Study Indications: Covid-19, and pulmonary embolism.  Comparison Study: No prior study on file Performing Technologist: Sharion Dove RVS  Examination Guidelines: A complete evaluation includes B-mode imaging, spectral  Doppler, color Doppler, and power Doppler as needed of all accessible portions of each vessel. Bilateral testing is considered an integral part of a complete examination. Limited examinations for reoccurring indications may be performed as noted. The reflux portion of the exam is performed with the patient in reverse Trendelenburg.  +---------+---------------+---------+-----------+----------+--------------+ RIGHT    CompressibilityPhasicitySpontaneityPropertiesThrombus Aging +---------+---------------+---------+-----------+----------+--------------+ CFV      Full           Yes      Yes                                 +---------+---------------+---------+-----------+----------+--------------+ SFJ      Full                                                        +---------+---------------+---------+-----------+----------+--------------+ FV Prox  Full                                                        +---------+---------------+---------+-----------+----------+--------------+ FV Mid   Full                                                        +---------+---------------+---------+-----------+----------+--------------+ FV DistalFull                                                        +---------+---------------+---------+-----------+----------+--------------+ PFV      Full                                                        +---------+---------------+---------+-----------+----------+--------------+ POP      Full           Yes      Yes                                 +---------+---------------+---------+-----------+----------+--------------+  PTV      None                                         Acute          +---------+---------------+---------+-----------+----------+--------------+ PERO     Full                                                        +---------+---------------+---------+-----------+----------+--------------+    +---------+---------------+---------+-----------+----------+--------------+ LEFT     CompressibilityPhasicitySpontaneityPropertiesThrombus Aging +---------+---------------+---------+-----------+----------+--------------+ CFV      Full           Yes      Yes                                 +---------+---------------+---------+-----------+----------+--------------+ SFJ      Full                                                        +---------+---------------+---------+-----------+----------+--------------+ FV Prox  Full                                                        +---------+---------------+---------+-----------+----------+--------------+ FV Mid   Full                                                        +---------+---------------+---------+-----------+----------+--------------+ FV DistalFull                                                        +---------+---------------+---------+-----------+----------+--------------+ PFV      Full                                                        +---------+---------------+---------+-----------+----------+--------------+ POP      Full           Yes      Yes                                 +---------+---------------+---------+-----------+----------+--------------+ PTV      None                                         Acute          +---------+---------------+---------+-----------+----------+--------------+  PERO     None                                         Acute          +---------+---------------+---------+-----------+----------+--------------+     Summary: RIGHT: - Findings consistent with acute deep vein thrombosis involving the right posterior tibial veins.  LEFT: - Findings consistent with acute deep vein thrombosis involving the left posterior tibial veins, and left peroneal veins.  *See table(s) above for measurements and observations. Electronically signed by Deitra Mayo MD on  03/31/2020 at 3:40:25 PM.    Final     Microbiology: Recent Results (from the past 240 hour(s))  SARS Coronavirus 2 by RT PCR (hospital order, performed in [facility] hospital lab) Nasopharyngeal Nasopharyngeal Swab     Status: Abnormal   Collection Time: 04/04/2020  2:00 PM   Specimen: Nasopharyngeal Swab  Result Value Ref Range Status   SARS Coronavirus 2 POSITIVE (A) NEGATIVE Final    Comment: RESULT CALLED TO, READ BACK BY AND VERIFIED WITH: RN joe b. [phone] fcp (NOTE) SARS-CoV-2 target nucleic acids are DETECTED  SARS-CoV-2 RNA is generally detectable in upper respiratory specimens  during the acute phase of infection.  Positive results are indicative  of the presence of the identified virus, but do not rule out bacterial infection or co-infection with other pathogens not detected by the test.  Clinical correlation with patient history and  other diagnostic information is necessary to determine patient infection status.  The expected result is negative.  Fact Sheet for Patients:   StrictlyIdeas.no   Fact Sheet for Healthcare Providers:   BankingDealers.co.za    This test is not yet approved or cleared by the [place] FDA and  has been authorized for detection and/or diagnosis of SARS-CoV-2 by FDA under an Emergency Use Authorization (EUA).  This EUA will remain in effect (meaning this test can be  used) for the duration of  the COVID-19 declaration under Section 564(b)(1) of the Act, 21 U.S.C. section 360-bbb-3(b)(1), unless the authorization is terminated or revoked sooner.  Performed at [facility], [place] [address] [place], [place] [address]   Blood Culture (routine x 2)     Status: None (Preliminary result)   Collection Time: 03/29/2020  2:32 PM   Specimen: BLOOD RIGHT WRIST  Result Value Ref Range Status   Specimen Description BLOOD RIGHT WRIST  Final   Special Requests   Final    BOTTLES DRAWN AEROBIC  AND ANAEROBIC Blood Culture results may not be optimal due to an inadequate volume of blood received in culture bottles   Culture   Final    NO GROWTH 2 DAYS Performed at [facility], [place] [address], [place], [place] [address]    Report Status PENDING  Incomplete  Blood Culture (routine x 2)     Status: None (Preliminary result)   Collection Time: 03/18/2020  2:39 PM   Specimen: BLOOD  Result Value Ref Range Status   Specimen Description BLOOD RIGHT ANTECUBITAL  Final   Special Requests   Final    BOTTLES DRAWN AEROBIC AND ANAEROBIC Blood Culture results may not be optimal due to an inadequate volume of blood received in culture bottles   Culture   Final    NO GROWTH 2 DAYS Performed at [facility], [place] [address], [place], [place]  [address]    Report Status PENDING  Incomplete  Urine culture     Status: None   Collection Time: 03/25/2020  3:20 PM   Specimen: In/Out Cath Urine  Result Value Ref Range Status   Specimen Description IN/OUT CATH URINE  Final   Special Requests NONE  Final   Culture   Final    NO GROWTH Performed at [facility], [place] [address], [place], [place] [address]    Report Status 03/31/2020 FINAL  Final     Labs: Basic Metabolic Panel: Recent Labs  Lab 04/03/2020 1435 03/25/2020 1435 03/25/2020 1731 03/31/20 0604 04/01/20 0334  NA 127*  --   --  130* 133*  K 4.3   < >  --  4.4 3.9  CL 93*  --   --  99 99  CO2 22  --   --  20* 24  GLUCOSE 243*  --   --  213* 210*  BUN 28*  --   --  23 30*  CREATININE 1.56*  --  1.41* 1.09 1.19  CALCIUM 7.9*  --   --  7.4* 7.8*  MG  --   --   --  1.8 2.3  PHOS  --   --   --  3.3  --    < > = values in this interval not displayed.   Liver Function Tests: Recent Labs  Lab 03/15/2020 1435 03/31/20 0604 04/01/20 0334  AST 30 23 28   ALT 34 27 29  ALKPHOS 145* 131* 187*  BILITOT 0.7 0.7 0.5  PROT 6.1* 5.4* 5.2*  ALBUMIN 1.9* 1.5* 1.6*   No results for input(s): LIPASE, AMYLASE in the last 168  hours. No results for input(s): AMMONIA in the last 168 hours. CBC: Recent Labs  Lab 03/24/2020 1435 03/16/2020 1731 03/31/20 0604 04/01/20 0334  WBC 9.8 9.2 6.5 11.7*  NEUTROABS 9.0*  --  5.5 10.5*  HGB 8.5* 8.4* 8.4* 7.8*  HCT 28.2* 27.9* 27.5* 24.5*  MCV 78.1* 77.9* 78.1* 75.6*  PLT 416* 382 367 393   Cardiac Enzymes: No results for input(s): CKTOTAL, CKMB, CKMBINDEX, TROPONINI in the last 168 hours. D-Dimer Recent Labs    03/31/20 0604 04/01/20 0334  DDIMER 1.14* 4.55*   BNP: Invalid input(s): POCBNP CBG: Recent Labs  Lab 03/31/20 0800 03/31/20 1237 03/31/20 1550 04/01/20 0721 04/01/20 1150  GLUCAP 226* 280* 176* 199* 250*   Anemia work up Recent Labs    03/31/20 0604 04/01/20 0334  FERRITIN 73 87   Urinalysis    Component Value Date/Time   COLORURINE AMBER (A) 03/29/2020 1520   APPEARANCEUR CLEAR 03/10/2020 1520   LABSPEC 1.018 03/14/2020 1520   PHURINE 5.0 04/06/2020 1520   GLUCOSEU 150 (A) 04/05/2020 1520   HGBUR NEGATIVE 03/25/2020 [place] 04/03/2020 [place] 03/20/2020 1520   PROTEINUR 100 (A) 04/09/2020 1520   NITRITE NEGATIVE 03/16/2020 1520   LEUKOCYTESUR NEGATIVE 03/16/2020 1520   Sepsis Labs Invalid input(s): PROCALCITONIN,  WBC,  LACTICIDVEN     SIGNED:  Vianne Bulls, MD  [facility] 04-10-20, 6:52 AM Pager   If 7PM-7AM, please contact night-coverage [email or web address] Password [email or web address]

## 2020-04-10 NOTE — Progress Notes (Signed)
Consult received and discussed with Dr. Sloan Leiter on 8/23.   Epic chart reviewed.  Discussed with Night RN early this morning.  Unfortunately patient passed away overnight.  Florentina Jenny, PA-C Palliative Medicine Office:  [phone]  No charge note.

## 2020-04-10 DEATH — deceased

## 2020-04-11 ENCOUNTER — Ambulatory Visit (INDEPENDENT_AMBULATORY_CARE_PROVIDER_SITE_OTHER): Payer: Medicare HMO | Admitting: Gastroenterology

## 2020-04-29 ENCOUNTER — Institutional Professional Consult (permissible substitution): Payer: Medicare HMO | Admitting: Diagnostic Neuroimaging
